# Patient Record
Sex: Male | Born: 1960 | Race: Black or African American | Hispanic: No | Smoking: Never smoker
Health system: Southern US, Community
[De-identification: ages and names within clinical notes are randomized; demographics above are authoritative.]

## PROBLEM LIST (undated history)

## (undated) DIAGNOSIS — I639 Cerebral infarction, unspecified: Secondary | ICD-10-CM

## (undated) DIAGNOSIS — G4733 Obstructive sleep apnea (adult) (pediatric): Secondary | ICD-10-CM

## (undated) DIAGNOSIS — E119 Type 2 diabetes mellitus without complications: Secondary | ICD-10-CM

## (undated) DIAGNOSIS — M109 Gout, unspecified: Secondary | ICD-10-CM

## (undated) DIAGNOSIS — I10 Essential (primary) hypertension: Secondary | ICD-10-CM

## (undated) DIAGNOSIS — E785 Hyperlipidemia, unspecified: Secondary | ICD-10-CM

## (undated) DIAGNOSIS — I2510 Atherosclerotic heart disease of native coronary artery without angina pectoris: Secondary | ICD-10-CM

## (undated) DIAGNOSIS — C4492 Squamous cell carcinoma of skin, unspecified: Secondary | ICD-10-CM

## (undated) DIAGNOSIS — I422 Other hypertrophic cardiomyopathy: Secondary | ICD-10-CM

## (undated) HISTORY — DX: Squamous cell carcinoma of skin, unspecified: C44.92

---

## 2004-01-24 ENCOUNTER — Emergency Department: Payer: Self-pay | Admitting: General Practice

## 2005-03-06 ENCOUNTER — Emergency Department: Payer: Self-pay | Admitting: Unknown Physician Specialty

## 2006-10-24 ENCOUNTER — Emergency Department: Payer: Self-pay | Admitting: Emergency Medicine

## 2007-12-28 ENCOUNTER — Ambulatory Visit: Payer: Self-pay | Admitting: Cardiology

## 2007-12-28 ENCOUNTER — Inpatient Hospital Stay: Payer: Self-pay | Admitting: Internal Medicine

## 2008-09-06 ENCOUNTER — Emergency Department: Payer: Self-pay | Admitting: Emergency Medicine

## 2008-09-20 ENCOUNTER — Emergency Department: Payer: Self-pay | Admitting: Internal Medicine

## 2008-10-14 ENCOUNTER — Emergency Department: Payer: Self-pay | Admitting: Emergency Medicine

## 2009-05-21 ENCOUNTER — Emergency Department: Payer: Self-pay | Admitting: Internal Medicine

## 2009-08-07 ENCOUNTER — Emergency Department: Payer: Self-pay | Admitting: Emergency Medicine

## 2010-03-26 ENCOUNTER — Emergency Department: Payer: Self-pay | Admitting: Emergency Medicine

## 2010-03-30 ENCOUNTER — Emergency Department: Payer: Self-pay | Admitting: Emergency Medicine

## 2010-07-16 ENCOUNTER — Emergency Department: Payer: Self-pay | Admitting: Internal Medicine

## 2011-05-23 ENCOUNTER — Inpatient Hospital Stay: Payer: Self-pay | Admitting: Specialist

## 2011-05-23 LAB — COMPREHENSIVE METABOLIC PANEL
Albumin: 4 g/dL (ref 3.4–5.0)
Alkaline Phosphatase: 68 U/L (ref 50–136)
Anion Gap: 9 (ref 7–16)
BUN: 17 mg/dL (ref 7–18)
Bilirubin,Total: 0.8 mg/dL (ref 0.2–1.0)
Calcium, Total: 8.4 mg/dL — ABNORMAL LOW (ref 8.5–10.1)
Co2: 25 mmol/L (ref 21–32)
EGFR (Non-African Amer.): >60
Glucose: 111 mg/dL — ABNORMAL HIGH (ref 65–99)
Osmolality: 283 (ref 275–301)
SGOT(AST): 33 U/L (ref 15–37)
SGPT (ALT): 32 U/L
Sodium: 141 mmol/L (ref 136–145)
Total Protein: 7.5 g/dL (ref 6.4–8.2)

## 2011-05-23 LAB — URINALYSIS, COMPLETE
Bacteria: NONE SEEN
Blood: NEGATIVE
Glucose,UR: NEGATIVE mg/dL (ref 0–75)
Ketone: NEGATIVE
Ph: 6 (ref 4.5–8.0)
Protein: NEGATIVE
RBC,UR: 1 /HPF (ref 0–5)
Specific Gravity: 1.025 (ref 1.003–1.030)
WBC UR: NONE SEEN /HPF (ref 0–5)

## 2011-05-23 LAB — CBC
HGB: 13.1 g/dL (ref 13.0–18.0)
MCV: 97 fL (ref 80–100)
Platelet: 182 10*3/uL (ref 150–440)
RBC: 4 10*6/uL — ABNORMAL LOW (ref 4.40–5.90)
WBC: 6.1 10*3/uL (ref 3.8–10.6)

## 2011-05-23 LAB — CK TOTAL AND CKMB (NOT AT ARMC): CK, Total: 375 U/L — ABNORMAL HIGH (ref 35–232)

## 2011-05-23 LAB — PROTIME-INR
INR: 1
Prothrombin Time: 13.4 secs (ref 11.5–14.7)

## 2011-05-24 LAB — LIPID PANEL
Ldl Cholesterol, Calc: 34 mg/dL (ref 0–100)
VLDL Cholesterol, Calc: 58 mg/dL — ABNORMAL HIGH (ref 5–40)

## 2011-05-24 LAB — CBC WITH DIFFERENTIAL/PLATELET
Basophil #: 0 10*3/uL (ref 0.0–0.1)
Eosinophil #: 0.2 10*3/uL (ref 0.0–0.7)
Eosinophil %: 4.5 %
HGB: 12.9 g/dL — ABNORMAL LOW (ref 13.0–18.0)
Lymphocyte #: 2.3 10*3/uL (ref 1.0–3.6)
MCH: 32.4 pg (ref 26.0–34.0)
MCHC: 33.4 g/dL (ref 32.0–36.0)
Monocyte #: 0.5 10*3/uL (ref 0.0–0.7)
Monocyte %: 9.6 %
Neutrophil %: 41.3 %
Platelet: 164 10*3/uL (ref 150–440)
RBC: 3.99 10*6/uL — ABNORMAL LOW (ref 4.40–5.90)

## 2011-05-24 LAB — BASIC METABOLIC PANEL
Anion Gap: 11 (ref 7–16)
BUN: 16 mg/dL (ref 7–18)
Chloride: 109 mmol/L — ABNORMAL HIGH (ref 98–107)
Creatinine: 1.01 mg/dL (ref 0.60–1.30)
EGFR (African American): >60
EGFR (Non-African Amer.): >60
Glucose: 115 mg/dL — ABNORMAL HIGH (ref 65–99)
Osmolality: 285 (ref 275–301)

## 2012-05-21 ENCOUNTER — Emergency Department: Payer: Self-pay | Admitting: Emergency Medicine

## 2012-05-21 LAB — URINALYSIS, COMPLETE
Bacteria: NONE SEEN
Bilirubin,UR: NEGATIVE
Ketone: NEGATIVE
Nitrite: NEGATIVE
Ph: 6 (ref 4.5–8.0)
Specific Gravity: 1.01 (ref 1.003–1.030)
Squamous Epithelial: 2
WBC UR: NONE SEEN /HPF (ref 0–5)

## 2012-05-21 LAB — COMPREHENSIVE METABOLIC PANEL
Albumin: 4.1 g/dL (ref 3.4–5.0)
BUN: 13 mg/dL (ref 7–18)
Bilirubin,Total: 0.9 mg/dL (ref 0.2–1.0)
Chloride: 106 mmol/L (ref 98–107)
Co2: 29 mmol/L (ref 21–32)
Creatinine: 1.13 mg/dL (ref 0.60–1.30)
EGFR (African American): >60
Osmolality: 278 (ref 275–301)
SGPT (ALT): 32 U/L (ref 12–78)
Total Protein: 8 g/dL (ref 6.4–8.2)

## 2012-05-21 LAB — CK TOTAL AND CKMB (NOT AT ARMC): CK, Total: 411 U/L — ABNORMAL HIGH (ref 35–232)

## 2012-05-21 LAB — CBC
HCT: 41.5 % (ref 40.0–52.0)
HGB: 14 g/dL (ref 13.0–18.0)
MCH: 32.9 pg (ref 26.0–34.0)
MCHC: 33.7 g/dL (ref 32.0–36.0)
MCV: 98 fL (ref 80–100)
RBC: 4.25 10*6/uL — ABNORMAL LOW (ref 4.40–5.90)
RDW: 13.5 % (ref 11.5–14.5)
WBC: 5.9 10*3/uL (ref 3.8–10.6)

## 2012-06-08 ENCOUNTER — Emergency Department: Payer: Self-pay | Admitting: Emergency Medicine

## 2012-10-12 ENCOUNTER — Emergency Department: Payer: Self-pay | Admitting: Emergency Medicine

## 2012-10-12 LAB — CBC
HCT: 41.6 % (ref 40.0–52.0)
HGB: 14.1 g/dL (ref 13.0–18.0)
MCHC: 33.8 g/dL (ref 32.0–36.0)
MCV: 98 fL (ref 80–100)
Platelet: 198 10*3/uL (ref 150–440)
RBC: 4.25 10*6/uL — ABNORMAL LOW (ref 4.40–5.90)
WBC: 6.1 10*3/uL (ref 3.8–10.6)

## 2012-10-12 LAB — COMPREHENSIVE METABOLIC PANEL
Albumin: 4.1 g/dL (ref 3.4–5.0)
Alkaline Phosphatase: 80 U/L (ref 50–136)
Anion Gap: 9 (ref 7–16)
BUN: 13 mg/dL (ref 7–18)
Calcium, Total: 9.1 mg/dL (ref 8.5–10.1)
Chloride: 104 mmol/L (ref 98–107)
Co2: 26 mmol/L (ref 21–32)
Creatinine: 1.1 mg/dL (ref 0.60–1.30)
EGFR (African American): >60
EGFR (Non-African Amer.): >60
Osmolality: 277 (ref 275–301)
Potassium: 3.9 mmol/L (ref 3.5–5.1)
SGPT (ALT): 29 U/L (ref 12–78)
Sodium: 139 mmol/L (ref 136–145)
Total Protein: 8 g/dL (ref 6.4–8.2)

## 2012-10-12 LAB — CK TOTAL AND CKMB (NOT AT ARMC)
CK, Total: 359 U/L — ABNORMAL HIGH (ref 35–232)
CK, Total: 285 U/L — ABNORMAL HIGH (ref 35–232)
CK-MB: 3.9 ng/mL — ABNORMAL HIGH (ref 0.5–3.6)

## 2012-10-12 LAB — TROPONIN I: Troponin-I: 0.02 ng/mL

## 2012-12-06 ENCOUNTER — Emergency Department: Payer: Self-pay | Admitting: Emergency Medicine

## 2013-01-15 ENCOUNTER — Emergency Department: Payer: Self-pay | Admitting: Emergency Medicine

## 2013-01-15 LAB — CBC
HCT: 39.2 % — ABNORMAL LOW (ref 40.0–52.0)
HGB: 13.1 g/dL (ref 13.0–18.0)
MCHC: 33.4 g/dL (ref 32.0–36.0)
MCV: 97 fL (ref 80–100)
RBC: 4.04 10*6/uL — ABNORMAL LOW (ref 4.40–5.90)
RDW: 13.5 % (ref 11.5–14.5)

## 2013-01-15 LAB — COMPREHENSIVE METABOLIC PANEL
Bilirubin,Total: 0.5 mg/dL (ref 0.2–1.0)
Calcium, Total: 8.8 mg/dL (ref 8.5–10.1)
Co2: 23 mmol/L (ref 21–32)
EGFR (African American): >60
EGFR (Non-African Amer.): >60
Osmolality: 278 (ref 275–301)
SGOT(AST): 19 U/L (ref 15–37)
SGPT (ALT): 28 U/L (ref 12–78)
Total Protein: 7.4 g/dL (ref 6.4–8.2)

## 2013-01-15 LAB — URINALYSIS, COMPLETE
Bacteria: NONE SEEN
Blood: NEGATIVE
Ketone: NEGATIVE
Nitrite: NEGATIVE
Protein: NEGATIVE
RBC,UR: NONE SEEN /HPF (ref 0–5)
Specific Gravity: 1.012 (ref 1.003–1.030)
Squamous Epithelial: 1
WBC UR: <1 /HPF (ref 0–5)

## 2013-09-22 DIAGNOSIS — I639 Cerebral infarction, unspecified: Secondary | ICD-10-CM

## 2013-09-22 HISTORY — DX: Cerebral infarction, unspecified: I63.9

## 2013-09-28 ENCOUNTER — Encounter (HOSPITAL_COMMUNITY): Payer: Self-pay | Admitting: *Deleted

## 2013-09-28 ENCOUNTER — Emergency Department: Payer: Self-pay | Admitting: Student

## 2013-09-28 ENCOUNTER — Inpatient Hospital Stay (HOSPITAL_COMMUNITY)
Admission: EM | Admit: 2013-09-28 | Discharge: 2013-09-30 | DRG: 556 | Disposition: A | Discharge status: Home / Self Care | Payer: Medicaid Other | Source: Other Acute Inpatient Hospital | Attending: Neurology | Admitting: Neurology

## 2013-09-28 ENCOUNTER — Inpatient Hospital Stay (HOSPITAL_COMMUNITY): Payer: Medicaid Other

## 2013-09-28 DIAGNOSIS — Z7982 Long term (current) use of aspirin: Secondary | ICD-10-CM

## 2013-09-28 DIAGNOSIS — I422 Other hypertrophic cardiomyopathy: Secondary | ICD-10-CM | POA: Diagnosis present

## 2013-09-28 DIAGNOSIS — E781 Pure hyperglyceridemia: Secondary | ICD-10-CM | POA: Diagnosis present

## 2013-09-28 DIAGNOSIS — I10 Essential (primary) hypertension: Secondary | ICD-10-CM | POA: Diagnosis present

## 2013-09-28 DIAGNOSIS — R209 Unspecified disturbances of skin sensation: Secondary | ICD-10-CM | POA: Diagnosis present

## 2013-09-28 DIAGNOSIS — Z9282 Status post administration of tPA (rtPA) in a different facility within the last 24 hours prior to admission to current facility: Secondary | ICD-10-CM | POA: Diagnosis not present

## 2013-09-28 DIAGNOSIS — R4789 Other speech disturbances: Secondary | ICD-10-CM | POA: Diagnosis present

## 2013-09-28 DIAGNOSIS — E785 Hyperlipidemia, unspecified: Secondary | ICD-10-CM

## 2013-09-28 DIAGNOSIS — R29898 Other symptoms and signs involving the musculoskeletal system: Principal | ICD-10-CM | POA: Diagnosis present

## 2013-09-28 DIAGNOSIS — I6330 Cerebral infarction due to thrombosis of unspecified cerebral artery: Secondary | ICD-10-CM

## 2013-09-28 DIAGNOSIS — I498 Other specified cardiac arrhythmias: Secondary | ICD-10-CM | POA: Diagnosis present

## 2013-09-28 DIAGNOSIS — R29810 Facial weakness: Secondary | ICD-10-CM | POA: Diagnosis present

## 2013-09-28 DIAGNOSIS — M6281 Muscle weakness (generalized): Secondary | ICD-10-CM

## 2013-09-28 DIAGNOSIS — R531 Weakness: Secondary | ICD-10-CM | POA: Diagnosis present

## 2013-09-28 DIAGNOSIS — Z6833 Body mass index (BMI) 33.0-33.9, adult: Secondary | ICD-10-CM | POA: Diagnosis not present

## 2013-09-28 DIAGNOSIS — E669 Obesity, unspecified: Secondary | ICD-10-CM | POA: Diagnosis present

## 2013-09-28 HISTORY — DX: Hyperlipidemia, unspecified: E78.5

## 2013-09-28 HISTORY — DX: Essential (primary) hypertension: I10

## 2013-09-28 HISTORY — DX: Gout, unspecified: M10.9

## 2013-09-28 LAB — COMPREHENSIVE METABOLIC PANEL
ALBUMIN: 3.6 g/dL (ref 3.5–5.2)
ALK PHOS: 56 U/L
ALT: 27 U/L
ALT: 16 U/L (ref 0–53)
AST: 23 U/L (ref 0–37)
Albumin: 3.7 g/dL (ref 3.4–5.0)
Alkaline Phosphatase: 51 U/L (ref 39–117)
Anion Gap: 9 (ref 7–16)
Anion gap: 12 (ref 5–15)
BUN: 21 mg/dL — ABNORMAL HIGH (ref 7–18)
BUN: 24 mg/dL — ABNORMAL HIGH (ref 6–23)
Bilirubin,Total: 0.8 mg/dL (ref 0.2–1.0)
CALCIUM: 8.7 mg/dL (ref 8.5–10.1)
CALCIUM: 9 mg/dL (ref 8.4–10.5)
CO2: 25 mmol/L (ref 21–32)
CO2: 21 mEq/L (ref 19–32)
Chloride: 108 mmol/L — ABNORMAL HIGH (ref 98–107)
Chloride: 104 mEq/L (ref 96–112)
Creatinine, Ser: 1.29 mg/dL (ref 0.50–1.35)
Creatinine: 1.82 mg/dL — ABNORMAL HIGH (ref 0.60–1.30)
EGFR (African American): 48 — ABNORMAL LOW
EGFR (Non-African Amer.): 41 — ABNORMAL LOW
GFR calc Af Amer: 72 mL/min — ABNORMAL LOW (ref >90)
GFR calc non Af Amer: 62 mL/min — ABNORMAL LOW (ref >90)
GLUCOSE: 103 mg/dL — AB (ref 65–99)
Glucose, Bld: 91 mg/dL (ref 70–99)
Osmolality: 286 (ref 275–301)
POTASSIUM: 4 mmol/L (ref 3.5–5.1)
Potassium: 4.2 mEq/L (ref 3.7–5.3)
SGOT(AST): 36 U/L (ref 15–37)
SODIUM: 137 meq/L (ref 137–147)
Sodium: 142 mmol/L (ref 136–145)
TOTAL PROTEIN: 7 g/dL (ref 6.0–8.3)
Total Bilirubin: 0.9 mg/dL (ref 0.3–1.2)
Total Protein: 7.8 g/dL (ref 6.4–8.2)

## 2013-09-28 LAB — PROTIME-INR
INR: 1
PROTHROMBIN TIME: 13.1 s (ref 11.5–14.7)

## 2013-09-28 LAB — CBC
HCT: 41.3 % (ref 40.0–52.0)
HCT: 38.8 % — ABNORMAL LOW (ref 39.0–52.0)
HGB: 13.6 g/dL (ref 13.0–18.0)
Hemoglobin: 13.1 g/dL (ref 13.0–17.0)
MCH: 33 pg (ref 26.0–34.0)
MCH: 32.8 pg (ref 26.0–34.0)
MCHC: 33.1 g/dL (ref 32.0–36.0)
MCHC: 33.8 g/dL (ref 30.0–36.0)
MCV: 100 fL (ref 80–100)
MCV: 97 fL (ref 78.0–100.0)
Platelet: 177 10*3/uL (ref 150–440)
Platelets: 180 10*3/uL (ref 150–400)
RBC: 4.14 10*6/uL — ABNORMAL LOW (ref 4.40–5.90)
RBC: 4 MIL/uL — AB (ref 4.22–5.81)
RDW: 13 % (ref 11.5–14.5)
RDW: 12.9 % (ref 11.5–15.5)
WBC: 4.9 10*3/uL (ref 3.8–10.6)
WBC: 5.2 10*3/uL (ref 4.0–10.5)

## 2013-09-28 LAB — MRSA PCR SCREENING: MRSA BY PCR: NEGATIVE

## 2013-09-28 LAB — TROPONIN I: Troponin-I: <0.02 ng/mL

## 2013-09-28 LAB — APTT: Activated PTT: 29.3 secs (ref 23.6–35.9)

## 2013-09-28 MED ORDER — SODIUM CHLORIDE 0.9 % IV SOLN
INTRAVENOUS | Status: DC
  Administered 2013-09-28 – 2013-09-29 (×3): via INTRAVENOUS

## 2013-09-28 MED ORDER — STROKE: EARLY STAGES OF RECOVERY BOOK
Freq: Once | Status: AC
  Administered 2013-09-28: 19:00:00
  Filled 2013-09-28: qty 1

## 2013-09-28 MED ORDER — ACETAMINOPHEN 650 MG RE SUPP: 650.0000 mg | RECTAL | Status: DC | PRN

## 2013-09-28 MED ORDER — METOCLOPRAMIDE HCL 5 MG/ML IJ SOLN
10.0000 mg | Freq: Once | INTRAMUSCULAR | Status: AC
  Administered 2013-09-28: 10 mg via INTRAVENOUS
  Filled 2013-09-28: qty 2

## 2013-09-28 MED ORDER — LABETALOL HCL 5 MG/ML IV SOLN: 10.0000 mg | INTRAVENOUS | Status: DC | PRN

## 2013-09-28 MED ORDER — PANTOPRAZOLE SODIUM 40 MG IV SOLR
40.0000 mg | Freq: Every day | INTRAVENOUS | Status: DC
  Administered 2013-09-28: 40 mg via INTRAVENOUS
  Filled 2013-09-28 (×2): qty 40

## 2013-09-28 MED ORDER — ACETAMINOPHEN 325 MG PO TABS
650.0000 mg | ORAL_TABLET | ORAL | Status: DC | PRN
  Administered 2013-09-29 – 2013-09-30 (×2): 650 mg via ORAL
  Filled 2013-09-28 (×2): qty 2

## 2013-09-28 NOTE — H&P (Addendum)
Neurology H&P  CC: Left-sided weakness  History is obtained from: Patient  HPI: Marc Bradford is a 53 y.o. male with a history of hypertension who presented to [facility] with complaint of left-sided weakness earlier today. He states that it started around 1 PM and he presented to [facility]. There they consulted with neurology who advised giving TPA and therefore he was started on TPA. We were called to accept the patient in transfer following TPA administration.  On arrival here, his NIH was 8 and therefore he was taken for emergent angiography to evaluate for large vessel occlusion. Due to his creatinine being 1.8, he was taken for MR angiography rather than CTA and I personally accompanied the patient. While in the MR suite, a diffusion image was obtained which did not show any signs of acute stroke.  LKW: 1 PM tpa given?: Yes, given an outside facility    ROS: A 14 point ROS was performed and is negative except as noted in the HPI.   PMH: Hypertension  Family History: No history of stroke  Social History: Tob: Denies  Exam: Current vital signs: BP 147/80  Pulse 61  Resp 12  Ht 5\' 6"  (1.676 m)  Wt 94.3 kg (207 lb 14.3 oz)  BMI 33.57 kg/m2  SpO2 96% Vital signs in last 24 hours: Pulse Rate:  [58-66] 61 (08/11 1815) Resp:  [12-22] 12 (08/11 1815) BP: (130-160)/(72-98) 147/80 mmHg (08/11 1815) SpO2:  [95 %-98 %] 96 % (08/11 1815) Weight:  [94.3 kg (207 lb 14.3 oz)] 94.3 kg (207 lb 14.3 oz) (08/11 1630)  General: In bed, NAD HEENT: He has some blood oozing from his gumline CV: Regular rate and rhythm Respiratory: Non-labored breathing Abdomen: Nontender, nondistended Extremities: No edema Mental Status: Patient is awake, alert, oriented to person, place, month, year, and situation. Immediate and remote memory are intact. Patient is able to give a clear and coherent history. No signs of aphasia or neglect He is dysarthric Cranial Nerves: II: He  intermittently will not identify number of fingers in left visual field. Pupils are equal, round, and reactive to light.  Discs are difficult to visualize. III,IV, VI: EOMI without ptosis or diploplia.  V: Facial sensation is symmetric to temperature VII: Facial movement is symmetric.  VIII: hearing is intact to voice X: Uvula elevates symmetrically XI: Shoulder shrug is symmetric. XII: tongue is midline without atrophy or fasciculations.  Motor: Tone is normal on the right. Bulk is normal. 5/5 strength was present on the right side. He resists movement when his left arm is passively moved. He also is inconsistent, initially being unable to lift his arm to participate in finger-nose-finger and subsequently being able to perform and after being started by the examiner. He also is inconsistent with his left leg sometimes being unable to raise it and sometimes raising. Sensory: Sensation is diminished throughout the left side of his body Deep Tendon Reflexes: 2+ and symmetric in the biceps and patellae.  Cerebellar: FNF intact bilaterally Gait: Not tested due to recent TPA administration   I have reviewed labs in epic and the results pertinent to this consultation are: Creatinine at [place] 1.8  I have reviewed the images obtained: MRA no large vessel occlusion  Impression: 53 year old male with left-sided weakness of unclear etiology. Possibilities include tiny brainstem infarction not seen on initial imaging, cervical spine disease (though this would not explain his full symptoms), complicated migraine, conversion disorder.  Recommendations: 1. HgbA1c, fasting lipid panel 2. MRI C-spine and  we'll repeat thin cut diffusions through the brainstem 3. Frequent neuro checks 4. no antiplatelets for 24 hours following TPA 5. Telemetry monitoring 6. PT consult, OT consult, Speech consult 7. EEG 8. Will give Reglan 10 mg x1 for possible competent migraine 9. elevated creatinine-will  hydrate  This patient is critically ill and at significant risk of neurological worsening, death and care requires constant monitoring of vital signs, hemodynamics,respiratory and cardiac monitoring, neurological assessment, discussion with family, other specialists and medical decision making of high complexity. I spent 60 minutes of neurocritical care time  in the care of  this patient.  Ritta SlotMcNeill Gitty Osterlund, MD [facility] [phone]  If 7pm- 7am, please page neurology on call as listed in AMION. 09/28/2013  6:48 PM

## 2013-09-29 ENCOUNTER — Encounter (HOSPITAL_COMMUNITY): Payer: Self-pay | Admitting: Radiology

## 2013-09-29 ENCOUNTER — Inpatient Hospital Stay (HOSPITAL_COMMUNITY): Payer: Medicaid Other

## 2013-09-29 DIAGNOSIS — I517 Cardiomegaly: Secondary | ICD-10-CM

## 2013-09-29 LAB — LIPID PANEL
CHOL/HDL RATIO: 5.1 ratio
Cholesterol: 180 mg/dL (ref 0–200)
HDL: 35 mg/dL — AB (ref >39)
LDL CALC: 69 mg/dL (ref 0–99)
Triglycerides: 382 mg/dL — ABNORMAL HIGH (ref <150)
VLDL: 76 mg/dL — AB (ref 0–40)

## 2013-09-29 LAB — URINALYSIS, ROUTINE W REFLEX MICROSCOPIC
Bilirubin Urine: NEGATIVE
GLUCOSE, UA: NEGATIVE mg/dL
Hgb urine dipstick: NEGATIVE
KETONES UR: NEGATIVE mg/dL
Leukocytes, UA: NEGATIVE
Nitrite: NEGATIVE
Protein, ur: NEGATIVE mg/dL
Specific Gravity, Urine: 1.013 (ref 1.005–1.030)
UROBILINOGEN UA: 0.2 mg/dL (ref 0.0–1.0)
pH: 5.5 (ref 5.0–8.0)

## 2013-09-29 LAB — HEMOGLOBIN A1C
Hgb A1c MFr Bld: 5.3 % (ref <5.7)
Mean Plasma Glucose: 105 mg/dL (ref <117)

## 2013-09-29 MED ORDER — ASPIRIN 325 MG PO TABS
325.0000 mg | ORAL_TABLET | Freq: Every day | ORAL | Status: DC
  Administered 2013-09-29 – 2013-09-30 (×2): 325 mg via ORAL
  Filled 2013-09-29 (×2): qty 1

## 2013-09-29 MED ORDER — LISINOPRIL 40 MG PO TABS
40.0000 mg | ORAL_TABLET | Freq: Every day | ORAL | Status: DC
  Administered 2013-09-29 – 2013-09-30 (×2): 40 mg via ORAL
  Filled 2013-09-29 (×2): qty 1

## 2013-09-29 MED ORDER — FENOFIBRATE 160 MG PO TABS
160.0000 mg | ORAL_TABLET | Freq: Every day | ORAL | Status: DC
  Administered 2013-09-29 – 2013-09-30 (×2): 160 mg via ORAL
  Filled 2013-09-29 (×2): qty 1

## 2013-09-29 MED ORDER — PANTOPRAZOLE SODIUM 40 MG PO TBEC
40.0000 mg | DELAYED_RELEASE_TABLET | Freq: Every day | ORAL | Status: DC
  Administered 2013-09-29 – 2013-09-30 (×2): 40 mg via ORAL
  Filled 2013-09-29 (×2): qty 1

## 2013-09-29 MED ORDER — SIMVASTATIN 20 MG PO TABS
20.0000 mg | ORAL_TABLET | Freq: Every day | ORAL | Status: DC
  Administered 2013-09-29: 20 mg via ORAL
  Filled 2013-09-29 (×2): qty 1

## 2013-09-29 NOTE — Progress Notes (Signed)
Stroke Team Progress Note  HISTORY Saiquan Hands is a 53 y.o. male with a history of hypertension who presented to [facility] with complaint of left-sided weakness earlier today. He states that it started around 1 PM  On 09/28/13 and he presented to [facility]. There they consulted with neurology over the phone who advised giving TPA and therefore he was started on TPA. We were called to accept the patient in transfer following TPA administration.  On arrival here, his NIH was 8  But he had variable effort and therefore he was taken for emergent angiography to evaluate for large vessel occlusion. Due to his creatinine being 1.8, he was taken for MR angiography rather than CTA .Marland Kitchen While in the MR suite, a diffusion image was obtained which did not show any signs of acute stroke.  LKW: 1 PM  tpa given?: Yes, given at [facility] . He was admitted to the neuro ICU for further evaluation and treatment.  SUBJECTIVE His wife is at the bedside.  Overall he feels his condition is gradually improving. He states his speech and facial weakness have improved but he still has mild numbness on the left side. MRI scan of the brain and MRA of the brain last night were both normal  OBJECTIVE Most recent Vital Signs: Filed Vitals:   09/29/13 0500 09/29/13 0600 09/29/13 0700 09/29/13 0741  BP: 119/76 128/74 133/71   Pulse: 53 52 50   Temp:    98.1 F (36.7 C)  TempSrc:    Oral  Resp: 15 18 14    Height:      Weight:      SpO2: 98% 97% 97%    CBG (last 3)  No results found for this basename: GLUCAP,  in the last 72 hours  IV Fluid Intake:   . sodium chloride 50 mL/hr at 09/28/13 2151    MEDICATIONS  . pantoprazole (PROTONIX) IV  40 mg Intravenous QHS   PRN:  acetaminophen, acetaminophen, labetalol  Diet:  Cardiac   Activity:  Bedrest    DVT Prophylaxis: none due to iv tpa CLINICALLY SIGNIFICANT STUDIES Basic Metabolic Panel:  Recent Labs Lab 09/28/13 2104  NA 137  K 4.2  CL 104  CO2 21   GLUCOSE 91  BUN 24*  CREATININE 1.29  CALCIUM 9.0   Liver Function Tests:  Recent Labs Lab 09/28/13 2104  AST 23  ALT 16  ALKPHOS 51  BILITOT 0.9  PROT 7.0  ALBUMIN 3.6   CBC:  Recent Labs Lab 09/28/13 2104  WBC 5.2  HGB 13.1  HCT 38.8*  MCV 97.0  PLT 180   Coagulation: No results found for this basename: LABPROT, INR,  in the last 168 hours Cardiac Enzymes: No results found for this basename: CKTOTAL, CKMB, CKMBINDEX, TROPONINI,  in the last 168 hours Urinalysis: No results found for this basename: COLORURINE, APPERANCEUR, LABSPEC, PHURINE, GLUCOSEU, HGBUR, BILIRUBINUR, KETONESUR, PROTEINUR, UROBILINOGEN, NITRITE, LEUKOCYTESUR,  in the last 168 hours Lipid Panel    Component Value Date/Time   CHOL 180 09/29/2013 0238   TRIG 382* 09/29/2013 0238   HDL 35* 09/29/2013 0238   CHOLHDL 5.1 09/29/2013 0238   VLDL 76* 09/29/2013 0238   LDLCALC 69 09/29/2013 0238   HgbA1C  No results found for this basename: HGBA1C    Urine Drug Screen:   No results found for this basename: labopia, cocainscrnur, labbenz, amphetmu, thcu, labbarb    Alcohol Level: No results found for this basename: ETH,  in the last 168  hours  Mr Shirlee LatchMra Head Wo Contrast  09/28/2013   CLINICAL DATA:  Left-sided weakness.  Code stroke status post tPA.  EXAM: MRI HEAD WITHOUT CONTRAST  MRA HEAD WITHOUT CONTRAST  TECHNIQUE: Multiplanar, multiecho pulse sequences of the brain and surrounding structures were obtained without intravenous contrast. Angiographic images of the head were obtained using MRA technique without contrast.  COMPARISON:  Head CT 09/28/2013 and MRI 05/24/2011.  FINDINGS: MRI HEAD FINDINGS  At the direction of the attending neurologist, only axial and coronal diffusion weighted images and axial susceptibility weighted images were obtained. There is no acute infarct or evidence of intracranial hemorrhage. There is no midline shift. Ventricles and sulci are normal for age.  MRA HEAD FINDINGS  Visualized  distal vertebral arteries are patent and codominant. PICA origins are patent. AICA and SCA origins are patent. Basilar artery is patent without stenosis. PCAs are unremarkable. There are small bilateral posterior communicating arteries. Internal carotid arteries are patent from skullbase to carotid termini. ACAs and MCAs are unremarkable.  IMPRESSION: 1. No acute infarct or intracranial hemorrhage. 2. Unremarkable head MRA.   Electronically Signed   By: Sebastian AcheAllen  Grady   On: 09/28/2013 19:42   Mr Brain Wo Contrast  09/28/2013   CLINICAL DATA:  Left-sided weakness.  Code stroke status post tPA.  EXAM: MRI HEAD WITHOUT CONTRAST  MRA HEAD WITHOUT CONTRAST  TECHNIQUE: Multiplanar, multiecho pulse sequences of the brain and surrounding structures were obtained without intravenous contrast. Angiographic images of the head were obtained using MRA technique without contrast.  COMPARISON:  Head CT 09/28/2013 and MRI 05/24/2011.  FINDINGS: MRI HEAD FINDINGS  At the direction of the attending neurologist, only axial and coronal diffusion weighted images and axial susceptibility weighted images were obtained. There is no acute infarct or evidence of intracranial hemorrhage. There is no midline shift. Ventricles and sulci are normal for age.  MRA HEAD FINDINGS  Visualized distal vertebral arteries are patent and codominant. PICA origins are patent. AICA and SCA origins are patent. Basilar artery is patent without stenosis. PCAs are unremarkable. There are small bilateral posterior communicating arteries. Internal carotid arteries are patent from skullbase to carotid termini. ACAs and MCAs are unremarkable.  IMPRESSION: 1. No acute infarct or intracranial hemorrhage. 2. Unremarkable head MRA.   Electronically Signed   By: Sebastian AcheAllen  Grady   On: 09/28/2013 19:42    MRI of the brain  normal  MRA of the brain  normal  Carotid Doppler  pending  2D Echocardiogram  pending CXR  ordered  EKG   pending.   Therapy  Recommendations pending  Physical Exam   pleasant middle-aged [place] male currently not in distress.Awake alert. Afebrile. Head is nontraumatic. Neck is supple without bruit. Hearing is normal. Cardiac exam no murmur or gallop. Lungs are clear to auscultation. Distal pulses are well felt. Neurological Exam : Awake alert oriented x 3 normal speech and language. No face asymmetry. Tongue midline.  . Subjective diminished left peripheral field of vision but variable effort next is questionable Mild diminished fine finger movements on left. Orbits right over left upper extremity. Mild left grip weak but poor and variable effort. Mild left lower extremity drift but effort is variable and distractible. Diminished left upper extremity and lower extremity touch and pinprick sensation . Normal coordination. NIH SS 3  ASSESSMENT Mr. Jasmine DecemberJohnny Mittelstadt is a 53 y.o. 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 Patient's exam has some variable and inconsistent features.  On aspirin 81 mg orally every day prior to admission. Now on no antiplatelets for secondary stroke prevention. Patient with resultant  mild left hemisensory loss and leg Ms.. Stroke work up underway.   Hypertriglyceridemia   LDL 69 on zocor PTA  HT  Obesity   Hospital day # 1  TREATMENT/PLAN Continue ICU level care with close neurological exam and blood pressure check as per post TPA protocol. Repeat MRI scan of the brain limited with diffusion imaging and coronal sections through the brain stem to look for small infarct missed on the initial MRI. Check echocardiogram, carotid Dopplers and hemoglobin A1c. Mobilize out of bed, physical and occupational therapy consults I had a long discussion with the patient and his wife regarding his neurological presentation,  results of evaluation so far, plan for further testing, treatment and answered questions. He remains at risk for recurrent strokes and TIAs and needs risk stratification workup and treatment to prevent future more disabling strokes This patient is critically ill and at significant risk of neurological worsening, death and care requires constant monitoring of vital signs, hemodynamics,respiratory and cardiac monitoring,review of multiple databases, neurological assessment, discussion with family, other specialists and medical decision making of high complexity.I have made any additions or clarifications directly to the above note.  I spent 35 minutes of neurocritical care time  in the care of  this patient.  SIGNED    To contact Stroke Continuity provider, please refer to [email or web address]. After hours, contact General Neurology

## 2013-09-29 NOTE — Progress Notes (Signed)
OT Cancellation Note  Patient Details Name: Marc Bradford MRN: [id number] DOB: 06/29/1960   Cancelled Treatment:    Reason Eval/Treat Not Completed: Patient not medically ready (bedrest)  Harolyn RutherfordJones, [name] Pager: [phone]  09/29/2013, 7:44 AM

## 2013-09-29 NOTE — Progress Notes (Signed)
  Echocardiogram 2D Echocardiogram has been performed.  Arvil ChacoFoster, Morgann Woodburn 09/29/2013, 4:34 PM

## 2013-09-29 NOTE — Evaluation (Signed)
Physical Therapy Evaluation Patient Details Name: Marc Bradford MRN: [id number] DOB: [birth year] Today's Date: 09/29/2013   History of Present Illness  Mr. Marc Bradford is a 53 y.o. male presenting with  sudden onset of left-sided weakness, numbness and slurred speech from suspected right subcortical/brainstem infarct treated with IV TPA at [facility] but initial MRI scan is negative for acute stroke.   Clinical Impression  Patient demonstrates inconsistent findings on physical mobility evaluation. At this time, patient may benefit from continued skilled PT to address mobility and safety concerns.  OF NOTE: Patient reporting deficits in strength LLE, inconsistent with strength assessment. Patient with + Hoover sign for LLE strength. Patient also reporting limited ROM, however patient was able to perform functional activity with full ROM and strength with no noted deficits. Had patient attempt marching in place during static standing, patient reports that he cannot bend his knee, this therapist reminded patient that he was able to bend his knee without difficulty during supine to sit transfer and sitting EOB, after which patient began to demonstrate increased ability to flex LLE during marching through full range of motion.  During ambulation patient with inconsistent gait deviations varying with normal functional gait.      Follow Up Recommendations No PT follow up    Equipment Recommendations  None recommended by PT    Recommendations for Other Services  (May benefit from psych consult for stress ([facility])) Patient did note that wife has recently been receiving chemo therapy treatments.    Precautions / Restrictions Precautions Precautions: Fall Restrictions Weight Bearing Restrictions: No      Mobility  Bed Mobility Overal bed mobility: Independent             General bed mobility comments: patient with no difficulty to come to EOB. No noted wekaness or difficulty with movement.  BLEs with good functionl mobility.  Transfers Overall transfer level: Modified independent (Supervision for inconsistancy) Equipment used: None             General transfer comment: Inconsistent with transfer. During initial standing patient reported weakness and pain, when questioned if LLE was painful or numb he reported numbness, then when asked again he reported LLE painful. had patient sit and then perform sit <> stand again and patient with no difficulty, full range and ability to flex and extend without physical assist of any kind.   Ambulation/Gait Ambulation/Gait assistance: Modified independent (Device/Increase time);Supervision Ambulation Distance (Feet): 180 Feet Assistive device: None Gait Pattern/deviations:  (inconsistent gait deviations, see comments) Gait velocity: modestly decreased Gait velocity interpretation: Below normal speed for age/gender General Gait Details: Patient with inconsistent gait deviations, at times oatient with poor flexion of LLE, when verbally cued patient states that he could not bend his knee, patient reminded that he was able to bend his knee during transfer activity and then patient was able to ambulate with functionally normal gait and no deviations.  Patient encouraged to continue ambulation with nsg staff.  Stairs            Wheelchair Mobility    Modified Rankin (Stroke Patients Only) Modified Rankin (Stroke Patients Only) Pre-Morbid Rankin Score: No symptoms Modified Rankin: Moderate disability     Balance                                             Pertinent Vitals/Pain Pain Assessment: 0-10  Home Living Family/patient expects to be discharged to:: Private residence Living Arrangements: Spouse/significant other Available Help at Discharge: Family Type of Home: House Home Access: Stairs to enter Entrance Stairs-Rails: None Entrance Stairs-Number of Steps: 2 Home Layout: One level Home  Equipment: Cane - single point      Prior Function Level of Independence: Independent               Hand Dominance   Dominant Hand: Right    Extremity/Trunk Assessment   Upper Extremity Assessment: Overall WFL for tasks assessed           Lower Extremity Assessment: Overall WFL for tasks assessed (+hoover sign), inconsistent strength testing, modifications and assessment through function activity demonstrates full ROM bilaterally and good overall symmetrical strength despite reported weakness. Patient was able to perform single leg stance bilaterally with no need for assist despite inability to perform minimal/partial range of motion when verbally directed for testing.         Communication      Cognition Arousal/Alertness: Awake/alert Behavior During Therapy: WFL for tasks assessed/performed Overall Cognitive Status: Within Functional Limits for tasks assessed                      General Comments      Exercises General Exercises - Lower Extremity Ankle Circles/Pumps: AROM;Both;5 reps Long Arc Quad: AROM;Both;5 reps Hip Flexion/Marching: AROM;Both;5 reps      Assessment/Plan    PT Assessment Patient needs continued PT services  PT Diagnosis     PT Problem List Decreased mobility;Decreased safety awareness  PT Treatment Interventions DME instruction;Gait training;Stair training;Functional mobility training;Therapeutic activities;Therapeutic exercise;Balance training;Patient/family education   PT Goals (Current goals can be found in the Care Plan section) Acute Rehab PT Goals Patient Stated Goal: to go home PT Goal Formulation: With patient Time For Goal Achievement: 10/13/13 Potential to Achieve Goals: Good    Frequency Min 4X/week   Barriers to discharge        Co-evaluation               End of Session Equipment Utilized During Treatment: Gait belt Activity Tolerance: Patient tolerated treatment well Patient left: in bed;Other  (comment) (EEG in room) Nurse Communication: Mobility status         Time: [id number] PT Time Calculation (min): 26 min   Charges:   PT Evaluation $Initial PT Evaluation Tier I: 1 Procedure PT Treatments $Gait Training: 8-22 mins $Therapeutic Activity: 8-22 mins   PT G CodesFabio Asa:          Tabitha Tupper J 09/29/2013, 3:03 PM Charlotte Crumbevon Myah Guynes, PT DPT  [phone]

## 2013-09-29 NOTE — Progress Notes (Signed)
SLP Cancellation Note  Patient Details Name: Marc Bradford MRN: [id number] DOB: 12/22/1960   Cancelled treatment:       Reason Eval/Treat Not Completed: Patient at procedure or test/unavailable.  Will follow-up as able.  Fae PippinMelissa Karin Griffith, M.A., CCC-SLP [phone]  [name] 09/29/2013, 4:13 PM

## 2013-09-29 NOTE — Procedures (Signed)
EEG report.  Brief clinical history:  53 year old male with left-sided weakness of unclear etiology.  Technique: this is a 17 channel routine scalp EEG performed at the bedside with bipolar and monopolar montages arranged in accordance to the international 10/20 system of electrode placement. One channel was dedicated to EKG recording.  No sleep was achieved. Hyperventilation was utilized as activating procedure.  Description:In the wakeful state, the best background consisted of a low amplitude, posterior dominant, well sustained, symmetric and reactive 12 Hz rhythm.  Hyperventilation did not induce physiologic slowing or epileptiform discharges. No focal or generalized epileptiform discharges noted.  No pathologic areas of slowing seen.  EKG showed sinus rhythm.  Impression: this is a normal awake EEG. Please, be aware that a normal EEG does not exclude the possibility of epilepsy.   Clinical correlation is advised.  Wyatt Portelasvaldo Cyprus Kuang, MD

## 2013-09-29 NOTE — Progress Notes (Signed)
EEG completed; results pending.    

## 2013-09-30 DIAGNOSIS — E785 Hyperlipidemia, unspecified: Secondary | ICD-10-CM

## 2013-09-30 DIAGNOSIS — I10 Essential (primary) hypertension: Secondary | ICD-10-CM

## 2013-09-30 DIAGNOSIS — I422 Other hypertrophic cardiomyopathy: Secondary | ICD-10-CM

## 2013-09-30 HISTORY — DX: Essential (primary) hypertension: I10

## 2013-09-30 HISTORY — DX: Other hypertrophic cardiomyopathy: I42.2

## 2013-09-30 MED ORDER — FENOFIBRATE 160 MG PO TABS: 160.0000 mg | ORAL_TABLET | Freq: Every day | ORAL | Status: DC

## 2013-09-30 NOTE — Discharge Summary (Signed)
Stroke Discharge Summary  Patient ID: Marc RiggsJohnny Bradford    l   MRN: [id number]      DOB: 05/10/1960  Date of Admission: 09/28/2013 Date of Discharge: 09/30/2013  Attending Physician:  Delia HeadyPramod Geralda Baumgardner, MD, Stroke MD  Consulting Physician(s):   Treatment Team:  M[facility] Stroke, MD [facility], MD   Patient's PCP:  No primary provider on file.  Discharge Diagnoses:    Non organic Left-sided weakness   Other hypertrophic cardiomyopathy   Essential hypertension   apical hypertrophic cardiomyopathy.   Hyperlipidemia   Bradycardia   Obesity,  BMI: Body mass index is 33.57 kg/(m^2).  Past Medical History  Diagnosis Date  . Hypertension   . Gout   . Hyperlipidemia    History reviewed. No pertinent past surgical history.    Medication List         aspirin EC 81 MG tablet  Take 81 mg by mouth daily.     fenofibrate 160 MG tablet  Take 1 tablet (160 mg total) by mouth daily.     quinapril 40 MG tablet  Commonly known as:  ACCUPRIL  Take 40 mg by mouth daily.     simvastatin 20 MG tablet  Commonly known as:  ZOCOR  Take 20 mg by mouth at bedtime.        LABORATORY STUDIES CBC    Component Value Date/Time   WBC 5.2 09/28/2013 2104   RBC 4.00* 09/28/2013 2104   HGB 13.1 09/28/2013 2104   HCT 38.8* 09/28/2013 2104   PLT 180 09/28/2013 2104   MCV 97.0 09/28/2013 2104   MCH 32.8 09/28/2013 2104   MCHC 33.8 09/28/2013 2104   RDW 12.9 09/28/2013 2104   CMP    Component Value Date/Time   NA 137 09/28/2013 2104   K 4.2 09/28/2013 2104   CL 104 09/28/2013 2104   CO2 21 09/28/2013 2104   GLUCOSE 91 09/28/2013 2104   BUN 24* 09/28/2013 2104   CREATININE 1.29 09/28/2013 2104   CALCIUM 9.0 09/28/2013 2104   PROT 7.0 09/28/2013 2104   ALBUMIN 3.6 09/28/2013 2104   AST 23 09/28/2013 2104   ALT 16 09/28/2013 2104   ALKPHOS 51 09/28/2013 2104   BILITOT 0.9 09/28/2013 2104   GFRNONAA 62* 09/28/2013 2104   GFRAA 72* 09/28/2013 2104   COAGS No results found for this basename: INR, PROTIME    Lipid Panel    Component Value Date/Time   CHOL 180 09/29/2013 0238   TRIG 382* 09/29/2013 0238   HDL 35* 09/29/2013 0238   CHOLHDL 5.1 09/29/2013 0238   VLDL 76* 09/29/2013 0238   LDLCALC 69 09/29/2013 0238   HgbA1C  Lab Results  Component Value Date   HGBA1C 5.3 09/29/2013   Cardiac Panel (last 3 results) No results found for this basename: CKTOTAL, CKMB, TROPONINI, RELINDX,  in the last 72 hours Urinalysis    Component Value Date/Time   COLORURINE YELLOW 09/29/2013 2035   APPEARANCEUR CLEAR 09/29/2013 2035   LABSPEC 1.013 09/29/2013 2035   PHURINE 5.5 09/29/2013 2035   GLUCOSEU NEGATIVE 09/29/2013 2035   HGBUR NEGATIVE 09/29/2013 2035   BILIRUBINUR NEGATIVE 09/29/2013 2035   KETONESUR NEGATIVE 09/29/2013 2035   PROTEINUR NEGATIVE 09/29/2013 2035   UROBILINOGEN 0.2 09/29/2013 2035   NITRITE NEGATIVE 09/29/2013 2035   LEUKOCYTESUR NEGATIVE 09/29/2013 2035   Urine Drug Screen  No results found for this basename: labopia, cocainscrnur, labbenz, amphetmu, thcu, labbarb    Alcohol Level No results found for this basename: eth  SIGNIFICANT DIAGNOSTIC STUDIES MRI of the brain normal  MRA of the brain normal  Carotid Doppler No evidence of hemodynamically significant internal carotid artery stenosis. Vertebral artery flow is antegrade.  2D Echocardiogram There is severe hypertrophy of the apical segments of the left ventricle (spade shape) consistent with apical form of hypertrophic cardiomyopathy.  CXR Mild cardiac enlargement, stable. No edema or consolidation. EKG Sinus bradycardia. ST & Marked T wave abnormality, consider anterolateral ischemia     History of Present Illness  Marc Bradford is a 53 y.o. male with a history of hypertension who presented to [facility] with complaint of left-sided weakness earlier today. He states that it started around 1 PM On 09/28/13 and he presented to [facility]. There they consulted with neurology over the phone who advised giving TPA  and therefore he was started on TPA. He was transferred to [facility] following TPA administration. On arrival, his NIHSS was 8 but he had variable effort and therefore he was taken for emergent angiography to evaluate for large vessel occlusion. Due to his creatinine being 1.8, he was taken for MR angiography rather than CTA. While in the MR suite, a diffusion image was obtained which did not show any signs of acute stroke. He was admitted to the neuro ICU for further evaluation and treatment.    Hospital Course Patient tolerated tPA without complication. Imaging at 24 hours shows no hemorrhage. Repeat MRI confirmed no acute infarct. Dx:  Left sided weakness and numbness not related to stroke/TIA; this was non-organic. Patient's exam has some variable and inconsistent features.   He was on  aspirin 81 mg orally every day prior to admission. He was resumed on aspirin 81 mg orally every day at time of discharge.   Patient with vascular risk factors of:  Hypertriglyceridemia  LDL 69 on zocor PTA  HT  Obesity  2D echo did show incidental severe hypertrophy of the apical segments of the left ventricle (spade shape) consistent with apical form of hypertrophic cardiomyopathy in the setting of bradycardia. Dr. Delton See consulted and recommended a cardiac MRI is recommended to confirm the diagnosis and risk stratify the patient. Ventricular tachycardias and SCD and ICD might be considered. Ideally she would like to use beta-blockers, however he is bradycardic. The MRI could not be done in a timely manner in the hospital, therefore, the patient was discharged. He is to follow up with her as an OP. Plans an exercise treadmill stress test to test arrhythmia inducibility on exertion.   Physical therapy, occupational therapy and speech therapy evaluated patient. No therapy f/u recommended.   Discharge Exam  Blood pressure 110/76, pulse 54, temperature 97.8 F (36.6 C), temperature source Oral, resp. rate 18,  height 5\' 6"  (1.676 m), weight 94.3 kg (207 lb 14.3 oz), SpO2 100.00%. pleasant middle-aged [place] American male currently not in distress.Awake alert. Afebrile. Head is nontraumatic. Neck is supple without bruit. Hearing is normal. Cardiac exam no murmur or gallop. Lungs are clear to auscultation. Distal pulses are well felt.  Neurological Exam : Awake alert oriented x 3 normal speech and language. No face asymmetry. Tongue midline. . Subjective diminished left peripheral field of vision but variable effort next is questionable Mild diminished fine finger movements on left. Orbits right over left upper extremity. Mild left grip weak but poor and variable effort. Mild left lower extremity drift but effort is variable and distractible. Diminished left upper extremity and lower extremity touch and pinprick sensation . Normal coordination.  NIH SS  3   Discharge Diet   Cardiac thin liquids  Discharge Plan    Disposition:  Home with wife   Continue aspirin 81 mg orally every day   Ongoing risk factor control by Primary Care Physician.  Follow-up primary provider in 2 weeks.  Follow up Dr. Roda Shutters in 2 months  Follow up DR. Nelson 10/19 at 845a  35 minutes were spent preparing discharge.  Annie Main, MSN, RN, ANVP-BC, ANP-BC, [facility] Pager: [phone] 10/01/2013 5:32 PM  I have personally examined this patient, reviewed notes, independently viewed imaging studies, participated in medical decision making and plan of care. I have made any additions or clarifications directly to the above note. Agree with note above.  Delia Heady, MD Medical Director [facility] Pager: [phone] 10/01/2013 5:43 PM

## 2013-09-30 NOTE — Evaluation (Signed)
Occupational Therapy Evaluation Patient Details Name: Marc Bradford MRN: [id number] DOB: Sep 02, 1960 Today's Date: 09/30/2013    History of Present Illness 53 y.o. male presenting with sudden onset of left-sided weakness, numbness and slurred speech given  IV TPA at [facility] but initial MRI scan is negative for acute stroke. NIH =8 on arrival   Clinical Impression   PT admitted for workup to R/o CVA s/p TPA with Lt side weakness and numbness. Pt currently with functional limitiations due to the deficits listed below (see OT problem list). PTA independent with all aspects of adls and Iadls. Pt will benefit from skilled OT to increase their independence and safety with adls and balance to allow discharge outpatient for LT Ue.Pt demonstrates decr strength and fine motor for LT UE compared to Rt. OT to follow acutely for LT UE HEP education.     Follow Up Recommendations  Outpatient OT    Equipment Recommendations  None recommended by OT    Recommendations for Other Services       Precautions / Restrictions Precautions Precautions: Fall      Mobility Bed Mobility Overal bed mobility: Independent             General bed mobility comments: HOB flat no rails  Transfers Overall transfer level: Needs assistance   Transfers: Sit to/from Stand Sit to Stand: Supervision         General transfer comment: pt ambulated to sink height. Pt transferred to chair then from chair to toilet. Pt transferred from toilet to chair. Pt using bil Ue on hand rest for support    Balance Overall balance assessment: Needs assistance         Standing balance support: During functional activity Standing balance-Leahy Scale: Fair                              ADL Overall ADL's : Needs assistance/impaired Eating/Feeding: Minimal assistance Eating/Feeding Details (indicate cue type and reason): (A) to open milk and lids Grooming: Wash/dry face;Oral care;Minimal  assistance;Standing Grooming Details (indicate cue type and reason): (A) to open box for tooth paste and tooth brush plastic Upper Body Bathing: Minimal assitance;Standing Upper Body Bathing Details (indicate cue type and reason): reports decr ability to reach and perform hygiene for Rt underarm due to LT UE deficits Lower Body Bathing: Minimal assistance;Sit to/from stand   Upper Body Dressing : Minimal assistance       Toilet Transfer: Min guard;Ambulation;Regular Social worker and Hygiene: Min guard;Sit to/from stand       Functional mobility during ADLs: Min guard General ADL Comments: Pt completed bed mobility independent level. Pt demonstrates Lt UE deficits with adls. Pt performed full adl this session alternating between sitting and standing. pt sitting to bath waist to toes.      Vision Eye Alignment: Within Functional Limits   Ocular Range of Motion: Within Functional Limits Tracking/Visual Pursuits: Able to track stimulus in all quads without difficulty   Convergence: Within functional limits         Perception Perception Perception Tested?: No   Praxis      Pertinent Vitals/Pain Pain Assessment: 0-10 Pain Score: 4  Pain Location: Rt arm  Pain Descriptors / Indicators: Numbness;Tingling Pain Intervention(s): Premedicated before session (describes as decr since medication)     Hand Dominance Right   Extremity/Trunk Assessment Upper Extremity Assessment Upper Extremity Assessment: LUE deficits/detail LUE Deficits / Details: ataxic movement with  reaching for objects, decr sensation hand to elbow. pt with decr grip strength 4 out 5  (reports numbness and tingling) LUE Sensation: decreased light touch LUE Coordination: decreased fine motor   Lower Extremity Assessment Lower Extremity Assessment: LLE deficits/detail LLE Sensation: decreased light touch (reports numbness)   Cervical / Trunk Assessment Cervical / Trunk  Assessment: Normal   Communication Communication Communication: No difficulties   Cognition Arousal/Alertness: Awake/alert Behavior During Therapy: WFL for tasks assessed/performed Overall Cognitive Status: Within Functional Limits for tasks assessed                     General Comments       Exercises       Shoulder Instructions      Home Living Family/patient expects to be discharged to:: Private residence Living Arrangements: Spouse/significant other Available Help at Discharge: Family Type of Home: House Home Access: Stairs to enter Secretary/administratorntrance Stairs-Number of Steps: 2 Entrance Stairs-Rails: None Home Layout: One level     Bathroom Shower/Tub: IT trainerTub/shower unit;Curtain   Bathroom Toilet: Standard     Home Equipment: Cane - single point          Prior Functioning/Environment Level of Independence: Independent             OT Diagnosis: Generalized weakness   OT Problem List: Decreased strength;Decreased activity tolerance;Impaired balance (sitting and/or standing);Decreased coordination;Impaired UE functional use   OT Treatment/Interventions: Self-care/ADL training;Therapeutic exercise;Neuromuscular education;Therapeutic activities;Balance training;Patient/family education    OT Goals(Current goals can be found in the care plan section) Acute Rehab OT Goals Patient Stated Goal: to go home OT Goal Formulation: With patient Time For Goal Achievement: 10/14/13 Potential to Achieve Goals: Good  OT Frequency: Min 2X/week   Barriers to D/C:            Co-evaluation              End of Session Equipment Utilized During Treatment: Gait belt Nurse Communication: Mobility status;Precautions  Activity Tolerance: Patient tolerated treatment well Patient left: in chair;with call bell/phone within reach   Time: 0810-0849 OT Time Calculation (min): 39 min Charges:  OT General Charges $OT Visit: 1 Procedure OT Evaluation $Initial OT Evaluation  Tier I: 1 Procedure OT Treatments $Self Care/Home Management : 23-37 mins G-Codes:    Boone MasterJones, Nhu Glasby B 09/30/2013, 9:01 AM Pager: [phone]

## 2013-09-30 NOTE — Consult Note (Signed)
CARDIOLOGY CONSULT NOTE   Patient ID: Marc Bradford MRN: [id number], DOB/AGE: 53/06/1960   Admit date: 09/28/2013 Date of Consult: 09/30/2013  Primary Physician: No primary provider on file. Primary Cardiologist: None  Reason for consult:  Abnormal ECG, echocardiogram  Problem List  Past Medical History  Diagnosis Date  . Hypertension   . Gout   . Hyperlipidemia     History reviewed. No pertinent past surgical history.   Allergies  No Known Allergies  HPI   Marc DecemberJohnny Bradford is a 53 y.o. male with a history of hypertension and hyperlipidemia treated with atorvastatin 10 mg daily at home who presented to [facility] with complaint of left-sided weakness yesterday. He received tPA, MRI/MRA showed no acute infarct and his symptoms have resolved.   The patient  Had an echocardiogram performed that showed normal biventricular size and function, mild basal left ventricular hypertrophy and severe apical hypertrophy with wall thickness of 19 mm. This is highly suspicious of hypertrophic cardiomyopathy. 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 He feels SOB at moderate exertion but not with the activities of daily living.  He has never smoked. He denies chest pain, palpitations or any previous syncope. His mother died suddenly in early 1830' of an unknown cause. The autopsy was not performed. One brother died of drug overdose.  Inpatient Medications  . aspirin  325 mg Oral Daily  . fenofibrate  160 mg Oral Daily  . lisinopril  40 mg Oral Daily  . pantoprazole  40 mg Oral Q1200  . simvastatin  20 mg Oral q1800    Family History History reviewed. No pertinent family history.   Social History History   Social History  . Marital Status: Married    Spouse Name: N/A    Number of Children: N/A  . Years of Education: N/A   Occupational History  . Not on file.   Social History Main Topics  . Smoking status: Never Smoker   . Smokeless tobacco: Not on  file  . Alcohol Use: Not on file  . Drug Use: Not on file  . Sexual Activity: Not on file   Other Topics Concern  . Not on file   Social History Narrative  . No narrative on file     Review of Systems  General:  No chills, fever, night sweats or weight changes.  Cardiovascular:  No chest pain, dyspnea on exertion, edema, orthopnea, palpitations, paroxysmal nocturnal dyspnea. Dermatological: No rash, lesions/masses Respiratory: No cough, dyspnea Urologic: No hematuria, dysuria Abdominal:   No nausea, vomiting, diarrhea, bright red blood per rectum, melena, or hematemesis Neurologic:  No visual changes, wkns, changes in mental status. All other systems reviewed and are otherwise negative except as noted above.  Physical Exam  Blood pressure 155/89, pulse 56, temperature 98.1 F (36.7 C), temperature source Oral, resp. rate 18, height 5\' 6"  (1.676 m), weight 207 lb 14.3 oz (94.3 kg), SpO2 100.00%.  General: Pleasant, NAD Psych: Normal affect. Neuro: Alert and oriented X 3. Moves all extremities spontaneously. HEENT: Normal  Neck: Supple without bruits or JVD. Lungs:  Resp regular and unlabored, CTA. Heart: RRR no s3, s4, or murmurs. Abdomen: Soft, non-tender, non-distended, BS + x 4.  Extremities: No clubbing, cyanosis or edema. DP/PT/Radials 2+ and equal bilaterally.  Labs  No results found for this basename: CKTOTAL, CKMB, TROPONINI,  in the last 72 hours Lab Results  Component Value Date   WBC 5.2 09/28/2013   HGB 13.1 09/28/2013  HCT 38.8* 09/28/2013   MCV 97.0 09/28/2013   PLT 180 09/28/2013    Recent Labs Lab 09/28/13 2104  NA 137  K 4.2  CL 104  CO2 21  BUN 24*  CREATININE 1.29  CALCIUM 9.0  PROT 7.0  BILITOT 0.9  ALKPHOS 51  ALT 16  AST 23  GLUCOSE 91   Lab Results  Component Value Date   CHOL 180 09/29/2013   HDL 35* 09/29/2013   LDLCALC 69 09/29/2013   TRIG 382* 09/29/2013   Radiology/Studies  Mr Brain Wo Contrast  09/28/2013   CLINICAL DATA:   Left-sided weakness.  Code stroke status post tPA. Marland Kitchen  IMPRESSION: 1. No acute infarct or intracranial hemorrhage. 2. Unremarkable head MRA.      Dg Chest Port 1 View  09/29/2013   CLINICAL DATA:  Hypertension  EXAM: PORTABLE CHEST - 1 VIEW  COMPARISON:  September 28, 2013  FINDINGS: There is no edema or consolidation. Heart is mildly enlarged with pulmonary vascularity within normal limits. No adenopathy. No bone lesions.  IMPRESSION: Mild cardiac enlargement, stable.  No edema or consolidation.   Electronically Signed   By: Bretta Bang M.D.   On: 09/29/2013 14:40   Mr Brain Ltd W/o Cm  09/29/2013   CLINICAL DATA:  LEFT-sided weakness. Code stroke yesterday. Status post tPA.   IMPRESSION: Negative for acute ischemia.      Echocardiogram - 09/29/2013 Left ventricle: There is mild concentric hypertrophy of the basal left ventricular segments and severe hypertrophy in the left ventricular apical segments with wall thickness 19 mm. The cavity size was normal. There was mild concentric hypertrophy. Systolic function was normal. The estimated ejection fraction was in the range of 55% to 60%. Wall motion was normal; there were no regional wall motion abnormalities. Features are consistent with a pseudonormal left ventricular filling pattern, with concomitant abnormal relaxation and increased filling pressure (grade 2 diastolic dysfunction). Doppler parameters are consistent with elevated ventricular end-diastolic filling pressure. - Aortic valve: Trileaflet; normal thickness leaflets. There was no regurgitation. - Aortic root: The aortic root was normal in size. - Left atrium: The atrium was normal in size. - Right atrium: The atrium was normal in size. - Pericardium, extracardiac: There was no pericardial effusion.  ECG: Sinus bradycardia, deep inverted T waves in the anterolateral leads    ASSESSMENT AND PLAN  53 year old male with h/o HTN and hyperlipidemia admitted for TIA with  resolved symptoms.  He was found to have an apical for of hypertrophic cardiomyopathy. He has family h/o of SCD in his mother in early 21'. ECG is typical for apical form of HCM as well.   We will order a cardiac MRI to confirm the diagnosis and to risk stratify - late gadolinium enhancement warrants worse prognosis, ventricular tachycardias and SCD and ICD might be considered.   Ideally we would like to use beta-blockers, however he is bradycardic.   We will schedule him an outpatient appointment in our clinic. Once he is recovered form TIA we will order an exercise treadmill stress test to test arrhythmia inducibility on exertion.  Signed, Lars Masson, MD, [facility] 09/30/2013, 11:11 AM

## 2013-09-30 NOTE — Clinical Documentation Improvement (Signed)
Noted in progress notes  CC: Left-sided weakness Please further specify if possible clinical conditions?  ____Hemiparesis ____Hemiplegia                            Dominant,     Right   Or Left sided  ____Other Condition__________________ ____Cannot Clinically Determine   Supporting Information: Risk Factors:Hypertriglyceridemia  Signs & Symptoms:"He resists movement when his left arm is passively moved. He also is inconsistent, initially being unable to lift his arm to participate in finger-nose-finger // inconsistent with his left leg sometimes being unable to raise it and sometimes raising." "facial weakness have improved but he still has mild numbness on the left side. // with resultant mild left hemisensory loss and leg   [facility]                   PT Evaluation                   OT Evaluation                   Frequent neuro checks, ICU level care                    Repeat MRI scan of the brain                     Fall precautions  Thank You, Marc GaussGarnet C Lovenia Bradford ,RN Clinical Documentation Specialist:  [phone]  [facility]- Health Information Management

## 2013-09-30 NOTE — Progress Notes (Signed)
Physical Therapy Treatment Patient Details Name: Marc Bradford MRN: [id number] DOB: 07/03/1960 Today's Date: 09/30/2013    History of Present Illness Mr. Marc DecemberJohnny Tayler is a 53 y.o. male presenting with  sudden onset of left-sided weakness, numbness and slurred speech from suspected right subcortical/brainstem infarct treated with IV TPA at [facility] but initial MRI scan is negative for acute stroke.     PT Comments    Patient demonstrates steady ambulation and demonstrates ability to navigate stairs (full flight) without any difficulty today. Wife present during activity and ambulation in room.  Follow Up Recommendations  No PT follow up     Equipment Recommendations  None recommended by PT    Recommendations for Other Services       Precautions / Restrictions      Mobility  Bed Mobility                  Transfers Overall transfer level: Modified independent Equipment used: None Transfers: Sit to/from Stand              Ambulation/Gait Ambulation/Gait assistance: Independent Ambulation Distance (Feet): 480 Feet Assistive device: None Gait Pattern/deviations: Step-through pattern;Decreased stride length;Narrow base of support   Gait velocity interpretation: Below normal speed for age/gender General Gait Details: a   Stairs Stairs: Yes Stairs assistance: Modified independent (Device/Increase time) Stair Management: One rail Right;Alternating pattern Number of Stairs: 12 General stair comments: initial cues for sequencing, however patient tolerated alternating steps without difficulty  Wheelchair Mobility    Modified Rankin (Stroke Patients Only)       Balance                                    Cognition Arousal/Alertness: Awake/alert Behavior During Therapy: WFL for tasks assessed/performed Overall Cognitive Status: Within Functional Limits for tasks assessed                      Exercises      General Comments         Pertinent Vitals/Pain      Home Living                      Prior Function            PT Goals (current goals can now be found in the care plan section) Acute Rehab PT Goals Patient Stated Goal: to go home PT Goal Formulation: With patient Time For Goal Achievement: 10/13/13 Potential to Achieve Goals: Good Progress towards PT goals: Progressing toward goals    Frequency  Min 4X/week    PT Plan Current plan remains appropriate    Co-evaluation             End of Session Equipment Utilized During Treatment: Gait belt Activity Tolerance: Patient tolerated treatment well Patient left: in chair;with call bell/phone within reach;with family/visitor present     Time: [id number] PT Time Calculation (min): 18 min  Charges:  $Gait Training: 8-22 mins                    G CodesFabio Asa:      Don Tiu J 09/30/2013, 12:12 PM Charlotte Crumbevon Tanaya Dunigan, PT DPT  [phone]

## 2013-09-30 NOTE — Progress Notes (Signed)
*  PRELIMINARY RESULTS* Vascular Ultrasound Carotid Duplex (Doppler) has been completed.  Preliminary findings: Bilateral:  1-39% ICA stenosis.  Vertebral artery flow is antegrade.     Farrel DemarkJill Eunice, RDMS, RVT  09/30/2013, 11:34 AM

## 2013-12-03 ENCOUNTER — Encounter: Payer: Self-pay | Admitting: *Deleted

## 2013-12-03 ENCOUNTER — Emergency Department: Payer: Self-pay | Admitting: Emergency Medicine

## 2013-12-03 LAB — CBC
HCT: 41.9 % (ref 40.0–52.0)
HGB: 14 g/dL (ref 13.0–18.0)
MCH: 33.1 pg (ref 26.0–34.0)
MCHC: 33.3 g/dL (ref 32.0–36.0)
MCV: 99 fL (ref 80–100)
Platelet: 194 10*3/uL (ref 150–440)
RBC: 4.22 10*6/uL — AB (ref 4.40–5.90)
RDW: 13.2 % (ref 11.5–14.5)
WBC: 5.6 10*3/uL (ref 3.8–10.6)

## 2013-12-03 LAB — COMPREHENSIVE METABOLIC PANEL
ALBUMIN: 4 g/dL (ref 3.4–5.0)
AST: 32 U/L (ref 15–37)
Alkaline Phosphatase: 64 U/L
Anion Gap: 8 (ref 7–16)
BILIRUBIN TOTAL: 0.7 mg/dL (ref 0.2–1.0)
BUN: 16 mg/dL (ref 7–18)
CREATININE: 1.12 mg/dL (ref 0.60–1.30)
Calcium, Total: 8.9 mg/dL (ref 8.5–10.1)
Chloride: 106 mmol/L (ref 98–107)
Co2: 26 mmol/L (ref 21–32)
EGFR (African American): >60
EGFR (Non-African Amer.): >60
GLUCOSE: 114 mg/dL — AB (ref 65–99)
Osmolality: 281 (ref 275–301)
POTASSIUM: 4.1 mmol/L (ref 3.5–5.1)
SGPT (ALT): 29 U/L
SODIUM: 140 mmol/L (ref 136–145)
Total Protein: 7.9 g/dL (ref 6.4–8.2)

## 2013-12-03 LAB — ETHANOL: Ethanol: <3 mg/dL (ref 0–80)

## 2013-12-03 LAB — ACETAMINOPHEN LEVEL

## 2013-12-03 LAB — SALICYLATE LEVEL: Salicylates, Serum: <1.7 mg/dL

## 2013-12-04 LAB — DRUG SCREEN, URINE
AMPHETAMINES, UR SCREEN: NEGATIVE (ref ?–1000)
BENZODIAZEPINE, UR SCRN: NEGATIVE (ref ?–200)
Barbiturates, Ur Screen: NEGATIVE (ref ?–200)
CANNABINOID 50 NG, UR ~~LOC~~: NEGATIVE (ref ?–50)
COCAINE METABOLITE, UR ~~LOC~~: NEGATIVE (ref ?–300)
MDMA (ECSTASY) UR SCREEN: NEGATIVE (ref ?–500)
METHADONE, UR SCREEN: NEGATIVE (ref ?–300)
Opiate, Ur Screen: NEGATIVE (ref ?–300)
Phencyclidine (PCP) Ur S: NEGATIVE (ref ?–25)
TRICYCLIC, UR SCREEN: NEGATIVE (ref ?–1000)

## 2013-12-06 ENCOUNTER — Encounter: Payer: Self-pay | Admitting: Cardiology

## 2013-12-06 ENCOUNTER — Ambulatory Visit (INDEPENDENT_AMBULATORY_CARE_PROVIDER_SITE_OTHER): Payer: Self-pay | Admitting: Cardiology

## 2013-12-06 VITALS — BP 128/72 | HR 68 | Ht 66.0 in | Wt 238.0 lb

## 2013-12-06 DIAGNOSIS — I422 Other hypertrophic cardiomyopathy: Secondary | ICD-10-CM

## 2013-12-06 LAB — BASIC METABOLIC PANEL
BUN: 16 mg/dL (ref 6–23)
CO2: 27 mEq/L (ref 19–32)
Calcium: 9.1 mg/dL (ref 8.4–10.5)
Chloride: 104 mEq/L (ref 96–112)
Creatinine, Ser: 1.2 mg/dL (ref 0.4–1.5)
GFR: 79.7 mL/min (ref >60.00)
Glucose, Bld: 104 mg/dL — ABNORMAL HIGH (ref 70–99)
Potassium: 4.1 mEq/L (ref 3.5–5.1)
Sodium: 138 mEq/L (ref 135–145)

## 2013-12-06 NOTE — Patient Instructions (Addendum)
Your physician recommends that you continue on your current medications as directed. Please refer to the Current Medication list given to you today.  Lab Today: [facility]  Your physician has requested that you have a cardiac MRI. Cardiac MRI uses a computer to create images of your heart as its beating, producing both still and moving pictures of your heart and major blood vessels. For further information please visit [email or web address]. Please follow the instruction sheet given to you today for more information.  Your physician recommends that you schedule a follow-up appointment in: 2 months

## 2013-12-06 NOTE — Progress Notes (Signed)
Patient ID: Marc Bradford, male   DOB: 11/16/1960, 53 y.o.   MRN: [id number]    Patient Name: Marc Bradford Date of Encounter: 12/06/2013  Primary Care Provider:  No primary provider on file. Primary Cardiologist:  Lars MassonNELSON, Aiyden Lauderback H  Problem List   Past Medical History  Diagnosis Date  . Gout   . Hyperlipidemia   . Other hypertrophic cardiomyopathy 09/30/2013  . Left-sided weakness 09/28/2013  . Essential hypertension 09/30/2013   Past Surgical History  Procedure Laterality Date  . No past surgeries     Allergies  No Known Allergies  HPI  Marc DecemberJohnny Guiffre is a 53 y.o. male with a history of hypertension and hyperlipidemia treated with atorvastatin 10 mg daily at home who was [facility] with complaint of left-sided weakness yesterday. He received tPA, MRI/MRA showed no acute infarct and his symptoms have resolved.  The patient Had an echocardiogram performed that showed normal biventricular size and function, mild basal left ventricular hypertrophy and severe apical hypertrophy with wall thickness of 19 mm. This is highly suspicious of hypertrophic cardiomyopathy. 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 He feels SOB at moderate exertion but not with the activities of daily living.  He has never smoked. He denies chest pain, palpitations or any previous syncope. His mother died suddenly in early 3330' of an unknown cause. The autopsy was not performed. One brother died of drug overdose.  He is coming for a follow up visit after two months. He is still recovering from his stroke, walking with a cane. He denies any chest pain, but with limited physical activity has stable DOE. He has h/o of two syncopal episodes that were never explained.   Home Medications  Prior to Admission medications   Medication Sig Start Date End Date Taking? Authorizing Provider  aspirin EC 81 MG tablet Take 81 mg by mouth daily.   Yes Historical Provider, MD  fenofibrate 160  MG tablet Take 1 tablet (160 mg total) by mouth daily. 09/30/13  Yes Layne BentonSharon L Biby, NP  quinapril (ACCUPRIL) 40 MG tablet Take 40 mg by mouth daily.   Yes Historical Provider, MD  simvastatin (ZOCOR) 20 MG tablet Take 20 mg by mouth at bedtime.   Yes Historical Provider, MD    Family History  Family History  Problem Relation Age of Onset  . Anemia Neg Hx   . Arrhythmia Neg Hx   . Asthma Neg Hx   . Cancer Neg Hx   . Depression Neg Hx   . Diabetes Neg Hx   . Heart attack Mother   . Heart failure Neg Hx   . Hyperlipidemia Neg Hx   . Hypertension Neg Hx   . Kidney disease Neg Hx   . Thyroid disease Neg Hx   . Fainting Neg Hx   . Stroke Neg Hx   . Sudden death Neg Hx     Social History  History   Social History  . Marital Status: Married    Spouse Name: N/A    Number of Children: N/A  . Years of Education: N/A   Occupational History  . Not on file.   Social History Main Topics  . Smoking status: Never Smoker   . Smokeless tobacco: Never Used  . Alcohol Use: Not on file  . Drug Use: Not on file  . Sexual Activity: Not on file   Other Topics Concern  . Not on file   Social History Narrative  . No  narrative on file     Review of Systems, as per HPI, otherwise negative General:  No chills, fever, night sweats or weight changes.  Cardiovascular:  No chest pain, dyspnea on exertion, edema, orthopnea, palpitations, paroxysmal nocturnal dyspnea. Dermatological: No rash, lesions/masses Respiratory: No cough, dyspnea Urologic: No hematuria, dysuria Abdominal:   No nausea, vomiting, diarrhea, bright red blood per rectum, melena, or hematemesis Neurologic:  No visual changes, wkns, changes in mental status. All other systems reviewed and are otherwise negative except as noted above.  Physical Exam  Blood pressure 128/72, pulse 68, height 5\' 6"  (1.676 m), weight 238 lb (107.956 kg), SpO2 96.00%.  General: Pleasant, NAD Psych: Normal affect. Neuro: Alert and oriented X  3. Moves all extremities spontaneously. HEENT: Normal  Neck: Supple without bruits or JVD. Lungs:  Resp regular and unlabored, CTA. Heart: RRR no s3, s4, or murmurs. Abdomen: Soft, non-tender, non-distended, BS + x 4.  Extremities: No clubbing, cyanosis or edema. DP/PT/Radials 2+ and equal bilaterally.  Labs:  No results found for this basename: CKTOTAL, CKMB, TROPONINI,  in the last 72 hours Lab Results  Component Value Date   WBC 5.2 09/28/2013   HGB 13.1 09/28/2013   HCT 38.8* 09/28/2013   MCV 97.0 09/28/2013   PLT 180 09/28/2013    No results found for this basename: DDIMER   No components found with this basename: POCBNP,     Component Value Date/Time   NA 137 09/28/2013 2104   K 4.2 09/28/2013 2104   CL 104 09/28/2013 2104   CO2 21 09/28/2013 2104   GLUCOSE 91 09/28/2013 2104   BUN 24* 09/28/2013 2104   CREATININE 1.29 09/28/2013 2104   CALCIUM 9.0 09/28/2013 2104   PROT 7.0 09/28/2013 2104   ALBUMIN 3.6 09/28/2013 2104   AST 23 09/28/2013 2104   ALT 16 09/28/2013 2104   ALKPHOS 51 09/28/2013 2104   BILITOT 0.9 09/28/2013 2104   GFRNONAA 62* 09/28/2013 2104   GFRAA 72* 09/28/2013 2104   Lab Results  Component Value Date   CHOL 180 09/29/2013   HDL 35* 09/29/2013   LDLCALC 69 09/29/2013   TRIG 382* 09/29/2013    Accessory Clinical Findings  Echocardiogram - 09/29/2013  Left ventricle: There is mild concentric hypertrophy of the basal left ventricular segments and severe hypertrophy in the left ventricular apical segments with wall thickness 19 mm. The cavity size was normal. There was mild concentric hypertrophy. Systolic function was normal. The estimated ejection fraction was in the range of 55% to 60%. Wall motion was normal; there were no regional wall motion abnormalities. Features are consistent with a pseudonormal left ventricular filling pattern, with concomitant abnormal relaxation and increased filling pressure (grade 2 diastolic dysfunction). Doppler parameters are  consistent with elevated ventricular end-diastolic filling pressure. - Aortic valve: Trileaflet; normal thickness leaflets. There was no regurgitation. - Aortic root: The aortic root was normal in size. - Left atrium: The atrium was normal in size. - Right atrium: The atrium was normal in size. - Pericardium, extracardiac: There was no pericardial effusion.  ECG: Sinus bradycardia, deep inverted T waves in the anterolateral leads    Assessment & Plan  53 year old male with h/o HTN and hyperlipidemia admitted for TIA with resolved symptoms.   1. Apical HCM - based on echo, strong FH of SCD, he needs cardiac MRI for risk stratification and potential need for an ICD - late gadolinium enhancement warrants worse prognosis, ventricular tachycardias and SCD and ICD might be  considered. H/o 2 syncopal episodes.  Ideally we would like to use beta-blockers, however he is bradycardic.  Once he is recovered form TIA we will order an exercise treadmill stress test to test arrhythmia inducibility on exertion, he is not able to walk on a treadmill yet..  2. BP - controlled   3. Hyperlipidemia - isolated TAG elevation - we will start fish oil   Follow up in 2 months.   Lars Masson, MD, [facility] 12/06/2013, 9:29 AM

## 2013-12-07 ENCOUNTER — Encounter: Payer: Self-pay | Admitting: Cardiology

## 2013-12-08 ENCOUNTER — Telehealth: Payer: Self-pay | Admitting: Cardiology

## 2013-12-08 NOTE — Telephone Encounter (Signed)
LMTCB

## 2013-12-08 NOTE — Telephone Encounter (Signed)
New message ° ° ° ° °Want lab results °

## 2013-12-08 NOTE — Telephone Encounter (Signed)
F/u ° ° °Pt returning your call °

## 2013-12-08 NOTE — Telephone Encounter (Signed)
Pt notified of normal labs per Dr Nelson.  Pt verbalized understanding.  

## 2013-12-23 ENCOUNTER — Ambulatory Visit (HOSPITAL_COMMUNITY)
Admission: RE | Admit: 2013-12-23 | Discharge: 2013-12-23 | Disposition: A | Discharge status: Home / Self Care | Payer: Self-pay | Source: Ambulatory Visit | Attending: Cardiology | Admitting: Cardiology

## 2013-12-23 DIAGNOSIS — I422 Other hypertrophic cardiomyopathy: Secondary | ICD-10-CM

## 2013-12-23 DIAGNOSIS — I517 Cardiomegaly: Secondary | ICD-10-CM | POA: Insufficient documentation

## 2013-12-23 MED ORDER — GADOBENATE DIMEGLUMINE 529 MG/ML IV SOLN
38.0000 mL | Freq: Once | INTRAVENOUS | Status: AC | PRN
  Administered 2013-12-23: 38 mL via INTRAVENOUS

## 2014-01-17 ENCOUNTER — Encounter: Payer: Self-pay | Admitting: *Deleted

## 2014-01-19 ENCOUNTER — Ambulatory Visit (INDEPENDENT_AMBULATORY_CARE_PROVIDER_SITE_OTHER): Payer: Self-pay | Admitting: Internal Medicine

## 2014-01-19 ENCOUNTER — Encounter: Payer: Self-pay | Admitting: Internal Medicine

## 2014-01-19 VITALS — BP 132/78 | HR 58 | Ht 66.0 in | Wt 240.0 lb

## 2014-01-19 DIAGNOSIS — I422 Other hypertrophic cardiomyopathy: Secondary | ICD-10-CM

## 2014-01-19 NOTE — Patient Instructions (Signed)
Your physician recommends that you continue on your current medications as directed. Please refer to the Current Medication list given to you today.  Your physician has recommended that you wear a 48 hour holter monitor. Holter monitors are medical devices that record the heart's electrical activity. Doctors most often use these monitors to diagnose arrhythmias. Arrhythmias are problems with the speed or rhythm of the heartbeat. The monitor is a small, portable device. You can wear one while you do your normal daily activities. This is usually used to diagnose what is causing palpitations/syncope (passing out).  Your physician has requested that you have an exercise tolerance test. For further information please visit [email or web address]. Please also follow instruction sheet, as given.  You are scheduled for 02/03/14 at 3pm

## 2014-01-19 NOTE — Progress Notes (Signed)
ELECTROPHYSIOLOGY CONSULT NOTE  Patient ID: Marc Bradford, MRN: [id number], DOB/AGE: 04/11/1960 53 y.o. Admit date: (Not on file) Date of Consult: 01/20/2014  Primary Physician: Lars Masson, MD Primary Cardiologist: KN  Chief Complaint: HCM   HPI Marc Bradford is a 53 y.o. male  Seen in consultation for hypertrophic cardiomyopathy. He has a history of hypertension and dyslipidemia. He had a stroke in August with residual left-sided weakness. It was nonhemorrhagic. During the evaluation he was found to have significant hypertrophy with apical involvement. He subsequently underwent MRI which demonstrated 29 mm apical segments with significant gadolinium enhancement.   He has modest exercise intolerance Without a history of peripheral edema.  His a history of syncope that occurred on 2 occasions about 5 or 8 years ago. These occurred following arguments with his wife. The second one he anticipated because a recognizable prodrome. They both occurred following abrupt standing in the context of a heated discussion  Family history is notable for his mother dying in her late 83s and another brother dying in his late 30s suddenly all seated on a chair. There are numerous siblings; his ability to recount the family history however is poor  He has had a sleep study which was apparently negative    Past Medical History  Diagnosis Date  . Gout   . Hyperlipidemia   . Other hypertrophic cardiomyopathy 09/30/2013  . Left-sided weakness 09/28/2013  . Essential hypertension 09/30/2013      Surgical History:  Past Surgical History  Procedure Laterality Date  . No past surgeries       Home Meds: Prior to Admission medications   Medication Sig Start Date End Date Taking? Authorizing Provider  aspirin EC 81 MG tablet Take 81 mg by mouth daily.    Historical Provider, MD  fenofibrate 160 MG tablet Take 1 tablet (160 mg total) by mouth daily. 09/30/13   Layne Benton, NP  quinapril (ACCUPRIL)  40 MG tablet Take 40 mg by mouth daily.    Historical Provider, MD  simvastatin (ZOCOR) 20 MG tablet Take 20 mg by mouth at bedtime.    Historical Provider, MD    Allergies: No Known Allergies  History   Social History  . Marital Status: Married    Spouse Name: N/A    Number of Children: N/A  . Years of Education: N/A   Occupational History  . Not on file.   Social History Main Topics  . Smoking status: Never Smoker   . Smokeless tobacco: Never Used  . Alcohol Use: Not on file  . Drug Use: Not on file  . Sexual Activity: Not on file   Other Topics Concern  . Not on file   Social History Narrative     Family History  Problem Relation Age of Onset  . Anemia Neg Hx   . Arrhythmia Neg Hx   . Asthma Neg Hx   . Cancer Neg Hx   . Depression Neg Hx   . Diabetes Other     family history  . Heart failure Neg Hx   . Hyperlipidemia Neg Hx   . Kidney disease Neg Hx   . Thyroid disease Neg Hx   . Fainting Neg Hx   . Stroke Neg Hx   . Heart attack Mother   . Sudden death Mother   . Hypertension Mother   . Drug abuse Brother   . Sudden death Father      ROS:  Please see the history of  present illness.     All other systems reviewed and negative.    Physical Exam: Blood pressure 132/78, pulse 58, height 5\' 6"  (1.676 m), weight 240 lb (108.863 kg). General: Well developed, well nourished male in no acute distress. Head: Normocephalic, atraumatic, sclera non-icteric, no xanthomas, nares are without discharge. EENT: normal Lymph Nodes:  none Back: without scoliosis/kyphosis, no CVA tendersness Neck: Negative for carotid bruits. JVD not elevated. Lungs: Clear bilaterally to auscultation without wheezes, rales, or rhonchi. Breathing is unlabored. Heart: RRR with S1 S2. No  murmur , rubs, or gallops appreciated. Abdomen: Soft, non-tender, non-distended with normoactive bowel sounds. No hepatomegaly. No rebound/guarding. No obvious abdominal masses. Msk:  Strength and tone  appear normal for age. Extremities: No clubbing or cyanosis. No edema.  Distal pedal pulses are 2+ and equal bilaterally. Skin: Warm and Dry Neuro: Alert and oriented X 3. CN III-XII intact Grossly normal sensory and motor function . Psych:  Responds to questions appropriately with a normal affect.      Labs: Cardiac Enzymes No results for input(s): CKTOTAL, CKMB, TROPONINI in the last 72 hours. CBC Lab Results  Component Value Date   WBC 5.2 09/28/2013   HGB 13.1 09/28/2013   HCT 38.8* 09/28/2013   MCV 97.0 09/28/2013   PLT 180 09/28/2013   PROTIME: No results for input(s): LABPROT, INR in the last 72 hours. Chemistry No results for input(s): NA, K, CL, CO2, BUN, CREATININE, CALCIUM, PROT, BILITOT, ALKPHOS, ALT, AST, GLUCOSE in the last 168 hours.  Invalid input(s): LABALBU Lipids Lab Results  Component Value Date   CHOL 180 09/29/2013   HDL 35* 09/29/2013   LDLCALC 69 09/29/2013   TRIG 382* 09/29/2013   BNP No results found for: PROBNP Miscellaneous No results found for: DDIMER  Radiology/Studies:  Mr Card Morphology Wo/w Cm  12/23/2013   CLINICAL DATA:  53 year old male with suspicion for apical hypertrophic cardiomyopathy on echocardiogram.  EXAM: CARDIAC MRI  TECHNIQUE: The patient was scanned on a 1.5 Tesla GE magnet. A dedicated cardiac coil was used. Functional imaging was done using Fiesta sequences. 2,3, and 4 chamber views were done to assess for RWMA's. Modified Simpson's rule using a short axis stack was used to calculate an ejection fraction on a dedicated work Research officer, trade unionstation using Circle software. The patient received 38 cc of Multihance. After 10 minutes inversion recovery sequences were used to assess for infiltration and scar tissue.  CONTRAST:  38 cc  of Multihance  FINDINGS: 1. Mildly dilated left ventricle with normal systolic function (LVEF = 66%).  There is mild to moderate concentric hypertrophy of the basal and mid portion of the left ventricle (thickness  11-14 mm) with severe hypertrophy of the apical segments with thickness 29 mm. Myocardial mass is severely increased at 308 grams.  The LV measurements are as follows:  LVEDD: 57 mm  LVESD:  35 mm  LVEDV:  147 ml  LVESV:  50 ml  SV:  97 ml  CO:  5 L/minute  2. Right ventricle is normal size with mild right ventricular hypertrophy and normal systolic function (RVEF = 56%).  3.  Normal bi-atrial size.  No significant valvular abnormality.  4. There is severe diffuse late gadolinium enhancement of all the of the apical segments of the left ventricle.  5. Normal size of the aortic root, ascending aorta and main pulmonary artery.  IMPRESSION: 1. Mildly dilated left ventricle with normal systolic function (LVEF = 66%). There is mild to moderate concentric  hypertrophy of the basal and mid portion of the left ventricle (thickness 11-14 mm) with severe hypertrophy of the apical segments with thickness 29 mm. Myocardial mass is severely increased at 308 grams.  There is severe diffuse late gadolinium enhancement of all the of the apical segments of the left ventricle.  These findings are consistent with hypertrophic cardiomyopathy predominantly in the apical segments.  The presence of the significant late gadolinium enhancement places the patient at the high risk for ventricular tachycardias. An ICD implantation should be considered.  2. Right ventricle is normal size with mild right ventricular hypertrophy and normal systolic function (RVEF = 56%).  Tobias AlexanderKatarina Nelson   Electronically Signed   By: Tobias AlexanderKatarina  Nelson   On: 12/23/2013 17:23    EKG: Sinus rhythm at 51 Intervals 20/10/48 Profound T-wave inversions anterolateral and inferior leads Assessment and Plan:  Hypertrophic Cardiomyopathy-apical variants  HTN  Abnormal MRI  Stroke    there are number of issues. The first is risk stratification for hypertrophic cardiomyopathy   His wall thickness is striking admit this is a reason for pursuing ICD implantation.  Further risk factors would be the presence of nonsustained ventricular tachycardia which we will evaluate by Holter an exercise associated hypotension which will  Pursue by exercise testing.   It is likely that he will come to ICD implantation. At that time, it would be my recommendation that we implant a dual-chamber device as HCM is frequently associated with atrial fibrillation and this man  Has had a previous stroke which might well be related to silent atrial fibrillation.  We will need also to pursue  Family screening.  We spent more than 50% of our >60 min visit in face to face counseling regarding the above  Pursuit of trying to explain the nature of the disease the implications of the diagnosis and the possible risks   Sherryl MangesSteven Klein

## 2014-01-21 ENCOUNTER — Telehealth: Payer: Self-pay

## 2014-01-21 NOTE — Telephone Encounter (Signed)
Spoke with patient regarding monitor placement. Told the patient we are out of monitors at the moment & that Marc [facility] will be [facility] soon. He is to call our office back if he does not hear from someone soon.

## 2014-01-31 ENCOUNTER — Telehealth: Payer: Self-pay | Admitting: Cardiovascular Disease

## 2014-01-31 ENCOUNTER — Telehealth: Payer: Self-pay | Admitting: Internal Medicine

## 2014-01-31 NOTE — Telephone Encounter (Signed)
Pt calling saying that he lost "elaine" number. She was going to help pt get monitor at [facility]. Please call

## 2014-01-31 NOTE — Telephone Encounter (Signed)
New message ° ° ° ° ° °Returning a nurses call °

## 2014-01-31 NOTE — Telephone Encounter (Signed)
Patient is waiting on an event monitor from [place] office and [facility]. Advised to call the [place] office and check with them. Asked him to call us back if he doesn't get a date for the monitor.

## 2014-01-31 NOTE — Telephone Encounter (Signed)
Notified patient of [facility] phone number.

## 2014-02-03 ENCOUNTER — Ambulatory Visit (INDEPENDENT_AMBULATORY_CARE_PROVIDER_SITE_OTHER): Payer: Self-pay | Admitting: Internal Medicine

## 2014-02-03 DIAGNOSIS — I422 Other hypertrophic cardiomyopathy: Secondary | ICD-10-CM

## 2014-02-03 NOTE — Progress Notes (Signed)
Exercise Treadmill Test For Treadmill test to look atBP response to exercise for risk stratification in the context of HOCM Pre-Exercise Testing Evaluation Rhythm: normal sinus  Rate: 72     Test  Exercise Tolerance Test Ordering MD: Sherryl MangesSteven Klein, MD  Interpreting MD: Sherryl MangesSteven Klein, MD  Unique Test No: 1  Treadmill:  1  Indication for ETT: HCM   Contraindication to ETT: No   Stress Modality: exercise - treadmill  Cardiac Imaging Performed: non   Protocol: standard Bruce - maximal  Max BP: 162/92    Pt unable to accomplish GXT

## 2014-02-04 ENCOUNTER — Ambulatory Visit: Payer: Self-pay | Admitting: Cardiology

## 2014-02-09 ENCOUNTER — Telehealth: Payer: Self-pay | Admitting: Internal Medicine

## 2014-02-09 NOTE — Telephone Encounter (Signed)
Called [facility] back (after receiving monitor results) and explained that I cannot accept critical report from 5 days ago. Holter monitor worn from 12/16-12/18. Will review results with Dr. Graciela HusbandsKlein.

## 2014-02-09 NOTE — Telephone Encounter (Signed)
[facility] called in a "critical" on patient from 48 hour holter. She reports 2 episodes of 2.20 sec pauses, but couldn't not give me date. She is faxing holter results for us to review.

## 2014-02-09 NOTE — Telephone Encounter (Signed)
New Message  Ruby [facility]) has critical Lab results to give. Please call back and discuss.

## 2014-02-14 ENCOUNTER — Telehealth: Payer: Self-pay | Admitting: Internal Medicine

## 2014-02-14 NOTE — Telephone Encounter (Signed)
New message       Pt insurance company would like for dr Graciela Husbandsklein to fill out a form to pay off a loan since he does not work anymore   please give pt a call

## 2014-02-21 NOTE — Telephone Encounter (Addendum)
F/u with Dr. Graciela Husbands on 03/24/14 - this ok per Dr. Graciela Husbands. Dr. Graciela Husbands reviewed monitor:  NSVT in setting of HCM.  Report faxed to [facility] office.

## 2014-02-21 NOTE — Telephone Encounter (Signed)
Informed patient he is welcome to drop paperwork off for Dr. Graciela Husbands to look at. But explained that if it needed to state he is unable to work - then we could not complete that for him, as Dr. Graciela Husbands has not stated he cannot work. Advised to drop off papers if he would like Dr. Graciela Husbands to see. Patient verbalized understanding.

## 2014-02-23 ENCOUNTER — Encounter: Payer: Self-pay | Admitting: Cardiology

## 2014-02-25 ENCOUNTER — Ambulatory Visit (INDEPENDENT_AMBULATORY_CARE_PROVIDER_SITE_OTHER): Payer: Self-pay

## 2014-02-25 ENCOUNTER — Other Ambulatory Visit: Payer: Self-pay

## 2014-02-25 DIAGNOSIS — I422 Other hypertrophic cardiomyopathy: Secondary | ICD-10-CM

## 2014-02-25 DIAGNOSIS — I4729 Other ventricular tachycardia: Secondary | ICD-10-CM

## 2014-02-25 DIAGNOSIS — I472 Ventricular tachycardia: Secondary | ICD-10-CM

## 2014-03-01 ENCOUNTER — Telehealth: Payer: Self-pay | Admitting: Cardiology

## 2014-03-01 NOTE — Telephone Encounter (Signed)
New message      Talk to the nurse to give an update from his PCP

## 2014-03-01 NOTE — Telephone Encounter (Signed)
Pt called stating that he received a letter for missing his appt on 02/04/14 with Dr. Delton SeeNelson.  He called to explain that he didn't come to this appt because he was here on 02/03/14 to see Dr. Graciela HusbandsKlein for a GXT and Dr. Graciela HusbandsKlein told him that he didn't need to come on the 18th, that he would see him back in 4-6 weeks.  Pt's next appt with Dr. Graciela HusbandsKlein is 03/24/14.

## 2014-03-24 ENCOUNTER — Encounter: Payer: Self-pay | Admitting: Internal Medicine

## 2014-03-24 ENCOUNTER — Ambulatory Visit (INDEPENDENT_AMBULATORY_CARE_PROVIDER_SITE_OTHER): Payer: Self-pay | Admitting: Internal Medicine

## 2014-03-24 VITALS — BP 142/82 | HR 65 | Ht 66.0 in | Wt 240.2 lb

## 2014-03-24 DIAGNOSIS — I4729 Other ventricular tachycardia: Secondary | ICD-10-CM

## 2014-03-24 DIAGNOSIS — I422 Other hypertrophic cardiomyopathy: Secondary | ICD-10-CM

## 2014-03-24 DIAGNOSIS — I472 Ventricular tachycardia: Secondary | ICD-10-CM

## 2014-03-24 NOTE — Patient Instructions (Signed)
Your physician recommends that you continue on your current medications as directed. Please refer to the Current Medication list given to you today.  Your physician has recommended that you have a defibrillator inserted. An implantable cardioverter defibrillator (ICD) is a small device that is placed in your chest or, in rare cases, your abdomen. This device uses electrical pulses or shocks to help control life-threatening, irregular heartbeats that could lead the heart to suddenly stop beating (sudden cardiac arrest). Leads are attached to the ICD that goes into your heart. This is done in the hospital and usually requires an overnight stay.   Carzell Saldivar, RN will call you to arrange this procedure

## 2014-03-24 NOTE — Progress Notes (Signed)
      Patient Care Team: Lars MassonKatarina H Nelson, MD as PCP - General (Cardiology)   HPI  Jasmine DecemberJohnny Bradford is a 54 y.o. male Seen in follow-up for hypertrophic cardiomyopathy. He has a history of hypertension and a prior stroke with some residual weakness. The stroke was nonhemorrhagic. He was found to havesignificant hypertrophy with apical involvement. He subsequently underwent MRI which demonstrated 29 mm apical segments with significant gadolinium enhancement.  He continues to have episodes of shortness of breath. He has some peripheral edema. He has significant sleep-disordered breathing and daytime somnolence.  He has had no tachycardia palpitations suggestive of atrial fibrillation.    Past Medical History  Diagnosis Date  . Gout   . Hyperlipidemia   . Other hypertrophic cardiomyopathy 09/30/2013  . Left-sided weakness 09/28/2013  . Essential hypertension 09/30/2013    Past Surgical History  Procedure Laterality Date  . No past surgeries      Current Outpatient Prescriptions  Medication Sig Dispense Refill  . aspirin EC 81 MG tablet Take 81 mg by mouth daily.    . fenofibrate 160 MG tablet Take 1 tablet (160 mg total) by mouth daily. 30 tablet 2  . quinapril (ACCUPRIL) 40 MG tablet Take 40 mg by mouth daily.    . simvastatin (ZOCOR) 20 MG tablet Take 20 mg by mouth at bedtime.     No current facility-administered medications for this visit.    No Known Allergies  Review of Systems negative except from HPI and PMH  Physical Exam BP 142/82 mmHg  Pulse 65  Ht 5\' 6"  (1.676 m)  Wt 240 lb 3.2 oz (108.954 kg)  BMI 38.79 kg/m2 Well developed and .Morbidly obese in no acute distress HENT normal E scleral and icterus clear Neck Supple JVP?  carotids brisk and full Clear to ausculation  Regular rate and rhythm, no murmurs gallops or rub Soft with active bowel sounds No clubbing cyanosis1+} Edema Alert and oriented, grossly normal motor and sensory function Skin Warm  and Dry  Holter monitor demonstrates nonsustained ventricular tachycardia nocturnal bradycardia and a mean heart rate during the day of about 70   Assessment and  Plan  Ventricular tachycardia-nonsustained  Hypertrophic cardiomyopathy- apical variant  HFpEF  Hypertension  Stroke  Sleep disordered breathing  The patient has hypertrophic cardiomyopathy.  Risk stratification includes Holter monitoring which has demonstrated nonsustained ventricular tachycardia. This identify significantly increased risk for sudden cardiac death;  ICD is recommended to    reduce the risk of sudden cardiac death.   He also has heart failure. We will add a diuretic. It would be advantageous to have beta blockers; dual-chamber device implantation will allow adjunctive use of these medications.  He has sleep disordered breathing and daytime somnolence. He needs a sleep study.  I'm also concerned as to whether he has atrial fibrillation as the cause of his stroke. It is highly concordant with sleep apnea and hypertrophic cardiomyopathy. Dual-chamber device and will allow us to monitor for atrial fibrillation. This would prompt us to change   To  anticoagulation from aspirin   Adjunctive beta blockers will help with blood pressure control  Have reviewed the potential benefits and risks of ICD implantation including but not limited to death, perforation of heart or lung, lead dislodgement, infection,  device malfunction and inappropriate shocks.  The patient and family express understanding  and are willing to proceed.

## 2014-04-01 ENCOUNTER — Telehealth: Payer: Self-pay | Admitting: *Deleted

## 2014-04-01 ENCOUNTER — Encounter: Payer: Self-pay | Admitting: *Deleted

## 2014-04-01 DIAGNOSIS — R400 Somnolence: Secondary | ICD-10-CM

## 2014-04-01 DIAGNOSIS — I422 Other hypertrophic cardiomyopathy: Secondary | ICD-10-CM

## 2014-04-01 DIAGNOSIS — Z01812 Encounter for preprocedural laboratory examination: Secondary | ICD-10-CM

## 2014-04-01 DIAGNOSIS — G4730 Sleep apnea, unspecified: Secondary | ICD-10-CM

## 2014-04-01 NOTE — Telephone Encounter (Signed)
Spoke with patient's wife, per his ok. Scheduled dual chamber ICD implant for 2/17. Pre procedure labs for 2/15. Wound check for 2/29. Reviewed letter of instructions with wife and left letter at front desk for them to pick up Monday when they come in for pre labs.  Also advised Dr. Graciela HusbandsKlein would like to have sleep study. They are aware someone will call them next week to arrange this study.

## 2014-04-04 ENCOUNTER — Other Ambulatory Visit (INDEPENDENT_AMBULATORY_CARE_PROVIDER_SITE_OTHER): Payer: Self-pay | Admitting: *Deleted

## 2014-04-04 DIAGNOSIS — Z01812 Encounter for preprocedural laboratory examination: Secondary | ICD-10-CM

## 2014-04-04 DIAGNOSIS — I422 Other hypertrophic cardiomyopathy: Secondary | ICD-10-CM

## 2014-04-04 LAB — BASIC METABOLIC PANEL
BUN: 12 mg/dL (ref 6–23)
CO2: 30 mEq/L (ref 19–32)
Calcium: 9.6 mg/dL (ref 8.4–10.5)
Chloride: 101 mEq/L (ref 96–112)
Creatinine, Ser: 1.22 mg/dL (ref 0.40–1.50)
GFR: 79.6 mL/min (ref >60.00)
GLUCOSE: 139 mg/dL — AB (ref 70–99)
Potassium: 4 mEq/L (ref 3.5–5.1)
Sodium: 137 mEq/L (ref 135–145)

## 2014-04-04 LAB — CBC WITH DIFFERENTIAL/PLATELET
Basophils Absolute: 0 10*3/uL (ref 0.0–0.1)
Basophils Relative: 0.6 % (ref 0.0–3.0)
EOS PCT: 3 % (ref 0.0–5.0)
Eosinophils Absolute: 0.2 10*3/uL (ref 0.0–0.7)
HEMATOCRIT: 42.1 % (ref 39.0–52.0)
Hemoglobin: 14.3 g/dL (ref 13.0–17.0)
Lymphocytes Relative: 44 % (ref 12.0–46.0)
Lymphs Abs: 2.3 10*3/uL (ref 0.7–4.0)
MCHC: 34 g/dL (ref 30.0–36.0)
MCV: 94.3 fl (ref 78.0–100.0)
MONOS PCT: 10.3 % (ref 3.0–12.0)
Monocytes Absolute: 0.5 10*3/uL (ref 0.1–1.0)
NEUTROS ABS: 2.2 10*3/uL (ref 1.4–7.7)
NEUTROS PCT: 42.1 % — AB (ref 43.0–77.0)
Platelets: 229 10*3/uL (ref 150.0–400.0)
RBC: 4.47 Mil/uL (ref 4.22–5.81)
RDW: 13.1 % (ref 11.5–15.5)
WBC: 5.3 10*3/uL (ref 4.0–10.5)

## 2014-04-05 DIAGNOSIS — Z7982 Long term (current) use of aspirin: Secondary | ICD-10-CM | POA: Diagnosis not present

## 2014-04-05 DIAGNOSIS — I422 Other hypertrophic cardiomyopathy: Secondary | ICD-10-CM | POA: Diagnosis present

## 2014-04-05 DIAGNOSIS — I10 Essential (primary) hypertension: Secondary | ICD-10-CM | POA: Diagnosis not present

## 2014-04-05 DIAGNOSIS — I509 Heart failure, unspecified: Secondary | ICD-10-CM | POA: Diagnosis not present

## 2014-04-05 DIAGNOSIS — M109 Gout, unspecified: Secondary | ICD-10-CM | POA: Diagnosis not present

## 2014-04-05 DIAGNOSIS — Z79899 Other long term (current) drug therapy: Secondary | ICD-10-CM | POA: Diagnosis not present

## 2014-04-05 DIAGNOSIS — G4730 Sleep apnea, unspecified: Secondary | ICD-10-CM | POA: Diagnosis not present

## 2014-04-05 DIAGNOSIS — Z8673 Personal history of transient ischemic attack (TIA), and cerebral infarction without residual deficits: Secondary | ICD-10-CM | POA: Diagnosis not present

## 2014-04-05 DIAGNOSIS — E785 Hyperlipidemia, unspecified: Secondary | ICD-10-CM | POA: Diagnosis not present

## 2014-04-05 MED ORDER — SODIUM CHLORIDE 0.9 % IR SOLN
80.0000 mg | Status: DC
  Filled 2014-04-05: qty 2

## 2014-04-05 MED ORDER — CEFAZOLIN SODIUM-DEXTROSE 2-3 GM-% IV SOLR: 2.0000 g | INTRAVENOUS | Status: DC

## 2014-04-05 MED ORDER — SODIUM CHLORIDE 0.9 % IV SOLN
INTRAVENOUS | Status: DC
Start: 2014-04-05 — End: 2014-04-06
  Administered 2014-04-06: 14:00:00 via INTRAVENOUS

## 2014-04-05 MED ORDER — SODIUM CHLORIDE 0.9 % IV SOLN
INTRAVENOUS | Status: DC
  Administered 2014-04-06: 14:00:00 via INTRAVENOUS

## 2014-04-06 ENCOUNTER — Ambulatory Visit (HOSPITAL_COMMUNITY)
Admission: RE | Admit: 2014-04-06 | Discharge: 2014-04-07 | Disposition: A | Discharge status: Home / Self Care | Payer: Medicaid Other | Source: Ambulatory Visit | Attending: Internal Medicine | Admitting: Internal Medicine

## 2014-04-06 ENCOUNTER — Encounter (HOSPITAL_COMMUNITY): Payer: Self-pay | Admitting: General Practice

## 2014-04-06 ENCOUNTER — Encounter (HOSPITAL_COMMUNITY)
Admission: RE | Disposition: A | Discharge status: Home / Self Care | Payer: Self-pay | Source: Ambulatory Visit | Attending: Internal Medicine

## 2014-04-06 DIAGNOSIS — I4729 Other ventricular tachycardia: Secondary | ICD-10-CM

## 2014-04-06 DIAGNOSIS — Z959 Presence of cardiac and vascular implant and graft, unspecified: Secondary | ICD-10-CM

## 2014-04-06 DIAGNOSIS — I509 Heart failure, unspecified: Secondary | ICD-10-CM | POA: Insufficient documentation

## 2014-04-06 DIAGNOSIS — I472 Ventricular tachycardia: Secondary | ICD-10-CM

## 2014-04-06 DIAGNOSIS — G4730 Sleep apnea, unspecified: Secondary | ICD-10-CM | POA: Insufficient documentation

## 2014-04-06 DIAGNOSIS — E785 Hyperlipidemia, unspecified: Secondary | ICD-10-CM | POA: Diagnosis not present

## 2014-04-06 DIAGNOSIS — I422 Other hypertrophic cardiomyopathy: Secondary | ICD-10-CM

## 2014-04-06 DIAGNOSIS — Z7982 Long term (current) use of aspirin: Secondary | ICD-10-CM | POA: Insufficient documentation

## 2014-04-06 DIAGNOSIS — I10 Essential (primary) hypertension: Secondary | ICD-10-CM | POA: Insufficient documentation

## 2014-04-06 DIAGNOSIS — Z8673 Personal history of transient ischemic attack (TIA), and cerebral infarction without residual deficits: Secondary | ICD-10-CM | POA: Insufficient documentation

## 2014-04-06 DIAGNOSIS — I5030 Unspecified diastolic (congestive) heart failure: Secondary | ICD-10-CM

## 2014-04-06 DIAGNOSIS — Z79899 Other long term (current) drug therapy: Secondary | ICD-10-CM | POA: Insufficient documentation

## 2014-04-06 DIAGNOSIS — M109 Gout, unspecified: Secondary | ICD-10-CM | POA: Insufficient documentation

## 2014-04-06 HISTORY — PX: IMPLANTABLE CARDIOVERTER DEFIBRILLATOR IMPLANT: SHX5473

## 2014-04-06 HISTORY — DX: Obstructive sleep apnea (adult) (pediatric): G47.33

## 2014-04-06 HISTORY — DX: Cerebral infarction, unspecified: I63.9

## 2014-04-06 HISTORY — DX: Other hypertrophic cardiomyopathy: I42.2

## 2014-04-06 LAB — SURGICAL PCR SCREEN
MRSA, PCR: NEGATIVE
Staphylococcus aureus: NEGATIVE

## 2014-04-06 SURGERY — IMPLANTABLE CARDIOVERTER DEFIBRILLATOR IMPLANT
Anesthesia: LOCAL

## 2014-04-06 MED ORDER — MIDAZOLAM HCL 5 MG/5ML IJ SOLN
INTRAMUSCULAR | Status: AC
  Filled 2014-04-06: qty 5

## 2014-04-06 MED ORDER — FENTANYL CITRATE 0.05 MG/ML IJ SOLN
INTRAMUSCULAR | Status: AC
  Filled 2014-04-06: qty 2

## 2014-04-06 MED ORDER — CHLORHEXIDINE GLUCONATE 4 % EX LIQD
60.0000 mL | Freq: Once | CUTANEOUS | Status: DC
  Filled 2014-04-06: qty 60

## 2014-04-06 MED ORDER — FENOFIBRATE 160 MG PO TABS
160.0000 mg | ORAL_TABLET | Freq: Every day | ORAL | Status: DC
  Administered 2014-04-07: 160 mg via ORAL
  Filled 2014-04-06: qty 1

## 2014-04-06 MED ORDER — HYDROCODONE-ACETAMINOPHEN 5-325 MG PO TABS
1.0000 | ORAL_TABLET | Freq: Four times a day (QID) | ORAL | Status: DC | PRN
  Administered 2014-04-07 (×2): 1 via ORAL
  Filled 2014-04-06 (×2): qty 1

## 2014-04-06 MED ORDER — FUROSEMIDE 10 MG/ML IJ SOLN
40.0000 mg | Freq: Once | INTRAMUSCULAR | Status: AC
  Administered 2014-04-06: 40 mg via INTRAVENOUS
  Filled 2014-04-06: qty 4

## 2014-04-06 MED ORDER — SODIUM CHLORIDE 0.9 % IV SOLN: INTRAVENOUS | Status: AC

## 2014-04-06 MED ORDER — TRAMADOL HCL 50 MG PO TABS
50.0000 mg | ORAL_TABLET | Freq: Two times a day (BID) | ORAL | Status: DC | PRN
  Administered 2014-04-06: 50 mg via ORAL
  Filled 2014-04-06: qty 1

## 2014-04-06 MED ORDER — ACETAMINOPHEN 325 MG PO TABS
325.0000 mg | ORAL_TABLET | ORAL | Status: DC | PRN
  Administered 2014-04-06: 650 mg via ORAL
  Filled 2014-04-06: qty 2

## 2014-04-06 MED ORDER — CEFAZOLIN SODIUM 1-5 GM-% IV SOLN
1.0000 g | Freq: Four times a day (QID) | INTRAVENOUS | Status: AC
  Administered 2014-04-06 – 2014-04-07 (×3): 1 g via INTRAVENOUS
  Filled 2014-04-06 (×3): qty 50

## 2014-04-06 MED ORDER — ASPIRIN EC 81 MG PO TBEC
81.0000 mg | DELAYED_RELEASE_TABLET | Freq: Every day | ORAL | Status: DC
  Administered 2014-04-07: 10:00:00 81 mg via ORAL
  Filled 2014-04-06: qty 1

## 2014-04-06 MED ORDER — MUPIROCIN 2 % EX OINT
TOPICAL_OINTMENT | CUTANEOUS | Status: AC
  Administered 2014-04-06: 1
  Filled 2014-04-06: qty 22

## 2014-04-06 MED ORDER — ASPIRIN EC 81 MG PO TBEC
81.0000 mg | DELAYED_RELEASE_TABLET | Freq: Every day | ORAL | Status: DC
Start: 2014-04-06 — End: 2014-04-06

## 2014-04-06 MED ORDER — ONDANSETRON HCL 4 MG/2ML IJ SOLN: 4.0000 mg | Freq: Four times a day (QID) | INTRAMUSCULAR | Status: DC | PRN

## 2014-04-06 MED ORDER — PROPRANOLOL HCL ER 80 MG PO CP24
80.0000 mg | ORAL_CAPSULE | Freq: Every day | ORAL | Status: DC
  Administered 2014-04-06 – 2014-04-07 (×2): 80 mg via ORAL
  Filled 2014-04-06 (×2): qty 1

## 2014-04-06 MED ORDER — LIDOCAINE HCL (PF) 1 % IJ SOLN
INTRAMUSCULAR | Status: AC
  Filled 2014-04-06: qty 30

## 2014-04-06 MED ORDER — SIMVASTATIN 20 MG PO TABS
20.0000 mg | ORAL_TABLET | Freq: Every day | ORAL | Status: DC
  Administered 2014-04-06: 20 mg via ORAL
  Filled 2014-04-06 (×2): qty 1

## 2014-04-06 MED ORDER — CEFAZOLIN SODIUM-DEXTROSE 2-3 GM-% IV SOLR
INTRAVENOUS | Status: AC
  Filled 2014-04-06: qty 50

## 2014-04-06 MED ORDER — HEPARIN (PORCINE) IN NACL 2-0.9 UNIT/ML-% IJ SOLN
INTRAMUSCULAR | Status: AC
  Filled 2014-04-06: qty 500

## 2014-04-06 MED ORDER — QUINAPRIL HCL 10 MG PO TABS
40.0000 mg | ORAL_TABLET | Freq: Every day | ORAL | Status: DC
  Administered 2014-04-07: 40 mg via ORAL
  Filled 2014-04-06: qty 4

## 2014-04-06 NOTE — CV Procedure (Signed)
Marc DecemberJohnny Bradford [id number]  [id number]  Preop Dx: hypertrophic cardiomyopathy with VT and Thickness 29 mm Postop Dx same/  NYHA Class 3  Cx: none apparent    Procedure: dual  chamber ICD implantation without intraoperative defibrillation threshold testing  Following the obtaining of informed consent the patient was brought to the electrophysiology laboratory in place of the fluoroscopic table in the supine position. After routine prep and drape, lidocaine was infiltrated in the prepectoral subclavicular region and an incision was made and carried down to the layer of the prepectoral fascia using electrocautery and sharp dissection. A pocket was formed similarly.  Thereafter  attention was turned to gaining access to the extrathoracic left subclavian vein which was accomplished with some  difficulty but without the aspiration of air or puncture of the artery.Two separate wires were placed through single venipuncture  Sequentially  A 9 French sheath  And 58F sheath were placed through which were   passed a Medtronic MRI compatible 6935 single coil   active fixation defibrillator lead, model 6935 serial number [id number] and a Medtronic MRI compatible 5076 active fixation atrial lead, serial number [id number] .  They  Were   passed under fluoroscopic guidance to the right ventricular apex and R atrial appendage  respectively.    In its location the bipolar R wave was 22 millivolts, impedance was 614 ohms, the pacing threshold was 0.8 volts at 0.5 msec. Current at threshold was 1.1 mA.  There was no diaphragmatic pacing at 10 V. The current of injury was brisk .  The bipolar P wave was 6.7 millivolts, impedance was 1180 ohms, the pacing threshold was 1.5 volts at 0.5 msec. Current at threshold was 1.4 mA.  There was no diaphragmatic pacing at 10 V. The current of injury was brisk .   The leads were secured to the prepectoral fascia and then attached to a Medtronic MRI compatible  ICD, serial number   [id number].  Through the device, the bipolar R wave was 20 millivolts, impedance was 513 ohms, the pacing threshold was 0.75 volts at 0.4 msec.  The bipolar P wave was 4.9 millivolts, impedance was 722 ohms, the pacing threshold was 1.0 volts at 0.4 msec. High-voltage impedance was  79 ohms.      The pocket was copiously irrigated with antibiotic containing saline solution. Hemostasis was assured, and the device and the leads were placed in the pocket and secured to the prepectoral fascia.  The wound was closed in 3 layers in normal fashion. The wound was washed dried and a DERMABOND dressing was then applied. Needle counts, sponge counts and instrument counts were correct at the end of the procedure according to the staff.   EBL minimal

## 2014-04-06 NOTE — Progress Notes (Addendum)
CTSP for site pain and concern for swelling. He has very minimal soft puffiness above the device site, and slight firmness to the skin below the device but without significant swelling. Contralateral side without device feels similar. Extensive pressure dressing already in place. Will leave on for tonight and see how he does overnight. Pain medication made available. EP to see in AM. Ronie Spiesayna Dunn PA-C

## 2014-04-06 NOTE — Interval H&P Note (Signed)
ICD Criteria  Current LVEF:60% ;Obtained > or = 1 month ago and < or = 3 months ago.  NYHA Functional Classification: Class II  Heart Failure History:  Yes, Duration of heart failure since onset is 3 to 9 months  Non-Ischemic Dilated Cardiomyopathy History:  No.  Atrial Fibrillation/Atrial Flutter:  No.  Ventricular Tachycardia History:  Yes, No hemodynamic instability. VT Type is unknown.  Cardiac Arrest History:  No  History of Syndromes with Risk of Sudden Death:  Yes, Other-Hypertrophic Cardiomyopathy  Previous ICD:  No.  Electrophysiology Study: No.  Prior MI: No.  PPM: No.  OSA:  Yes  Patient Life Expectancy of >=1 year: Yes.  Anticoagulation Therapy:  Patient is NOT on anticoagulation therapy.   Beta Blocker Therapy:  No, Reason not on Beta Blocker therapy: bradycardia  Ace Inhibitor/ARB Therapy:  Yes.History and Physical Interval Note:  04/06/2014 1:40 PM  Marc Bradford  has presented today for surgery, with the diagnosis of hypertrophic cardiomyopathy  The various methods of treatment have been discussed with the patient and family. After consideration of risks, benefits and other options for treatment, the patient has consented to  Procedure(s): IMPLANTABLE CARDIOVERTER DEFIBRILLATOR IMPLANT (N/A) as a surgical intervention .  The patient's history has been reviewed, patient examined, no change in status, stable for surgery.  I have reviewed the patient's chart and labs.  Questions were answered to the patient's satisfaction.     Sherryl MangesSteven Makelle Marrone

## 2014-04-06 NOTE — H&P (View-Only) (Signed)
      Patient Care Team: Lars MassonKatarina H Nelson, MD as PCP - General (Cardiology)   HPI  Marc Bradford is a 54 y.o. male Seen in follow-up for hypertrophic cardiomyopathy. He has a history of hypertension and a prior stroke with some residual weakness. The stroke was nonhemorrhagic. He was found to havesignificant hypertrophy with apical involvement. He subsequently underwent MRI which demonstrated 29 mm apical segments with significant gadolinium enhancement.  He continues to have episodes of shortness of breath. He has some peripheral edema. He has significant sleep-disordered breathing and daytime somnolence.  He has had no tachycardia palpitations suggestive of atrial fibrillation.    Past Medical History  Diagnosis Date  . Gout   . Hyperlipidemia   . Other hypertrophic cardiomyopathy 09/30/2013  . Left-sided weakness 09/28/2013  . Essential hypertension 09/30/2013    Past Surgical History  Procedure Laterality Date  . No past surgeries      Current Outpatient Prescriptions  Medication Sig Dispense Refill  . aspirin EC 81 MG tablet Take 81 mg by mouth daily.    . fenofibrate 160 MG tablet Take 1 tablet (160 mg total) by mouth daily. 30 tablet 2  . quinapril (ACCUPRIL) 40 MG tablet Take 40 mg by mouth daily.    . simvastatin (ZOCOR) 20 MG tablet Take 20 mg by mouth at bedtime.     No current facility-administered medications for this visit.    No Known Allergies  Review of Systems negative except from HPI and PMH  Physical Exam BP 142/82 mmHg  Pulse 65  Ht 5\' 6"  (1.676 m)  Wt 240 lb 3.2 oz (108.954 kg)  BMI 38.79 kg/m2 Well developed and .Morbidly obese in no acute distress HENT normal E scleral and icterus clear Neck Supple JVP?  carotids brisk and full Clear to ausculation  Regular rate and rhythm, no murmurs gallops or rub Soft with active bowel sounds No clubbing cyanosis1+} Edema Alert and oriented, grossly normal motor and sensory function Skin Warm  and Dry  Holter monitor demonstrates nonsustained ventricular tachycardia nocturnal bradycardia and a mean heart rate during the day of about 70   Assessment and  Plan  Ventricular tachycardia-nonsustained  Hypertrophic cardiomyopathy- apical variant  HFpEF  Hypertension  Stroke  Sleep disordered breathing  The patient has hypertrophic cardiomyopathy.  Risk stratification includes Holter monitoring which has demonstrated nonsustained ventricular tachycardia. This identify significantly increased risk for sudden cardiac death;  ICD is recommended to    reduce the risk of sudden cardiac death.   He also has heart failure. We will add a diuretic. It would be advantageous to have beta blockers; dual-chamber device implantation will allow adjunctive use of these medications.  He has sleep disordered breathing and daytime somnolence. He needs a sleep study.  I'm also concerned as to whether he has atrial fibrillation as the cause of his stroke. It is highly concordant with sleep apnea and hypertrophic cardiomyopathy. Dual-chamber device and will allow us to monitor for atrial fibrillation. This would prompt us to change   To  anticoagulation from aspirin   Adjunctive beta blockers will help with blood pressure control  Have reviewed the potential benefits and risks of ICD implantation including but not limited to death, perforation of heart or lung, lead dislodgement, infection,  device malfunction and inappropriate shocks.  The patient and family express understanding  and are willing to proceed.

## 2014-04-07 ENCOUNTER — Ambulatory Visit (HOSPITAL_COMMUNITY): Payer: Medicaid Other

## 2014-04-07 ENCOUNTER — Encounter (HOSPITAL_COMMUNITY): Payer: Self-pay | Admitting: *Deleted

## 2014-04-07 DIAGNOSIS — E785 Hyperlipidemia, unspecified: Secondary | ICD-10-CM | POA: Diagnosis not present

## 2014-04-07 DIAGNOSIS — I422 Other hypertrophic cardiomyopathy: Secondary | ICD-10-CM

## 2014-04-07 DIAGNOSIS — I509 Heart failure, unspecified: Secondary | ICD-10-CM

## 2014-04-07 DIAGNOSIS — I10 Essential (primary) hypertension: Secondary | ICD-10-CM | POA: Diagnosis not present

## 2014-04-07 MED ORDER — HYDROCODONE-ACETAMINOPHEN 5-325 MG PO TABS: 1.0000 | ORAL_TABLET | Freq: Four times a day (QID) | ORAL | Status: DC | PRN

## 2014-04-07 MED ORDER — PROPRANOLOL HCL ER 80 MG PO CP24: 80.0000 mg | ORAL_CAPSULE | Freq: Every day | ORAL | Status: DC

## 2014-04-07 NOTE — Discharge Instructions (Signed)
° ° °  Supplemental Discharge Instructions for  Pacemaker/Defibrillator Patients  Activity No heavy lifting or vigorous activity with your left/right arm for 6 to 8 weeks.  Do not raise your left/right arm above your head for one week.  Gradually raise your affected arm as drawn below.           __2/20/2016                  2/21                         2/22                   2/23  NO DRIVING for until seen by seen cardiologist  WOUND CARE - Keep the wound area clean and dry.  Do not get this area wet for one week. No showers for one week; you may shower on  04/14/2014   . - The tape/steri-strips on your wound will fall off; do not pull them off.  No bandage is needed on the site.  DO  NOT apply any creams, oils, or ointments to the wound area. - If you notice any drainage or discharge from the wound, any swelling or bruising at the site, or you develop a fever > 101? F after you are discharged home, call the office at once.  Special Instructions - You are still able to use cellular telephones; use the ear opposite the side where you have your pacemaker/defibrillator.  Avoid carrying your cellular phone near your device. - When traveling through airports, show security personnel your identification card to avoid being screened in the metal detectors.  Ask the security personnel to use the hand wand. - Avoid arc welding equipment, MRI testing (magnetic resonance imaging), TENS units (transcutaneous nerve stimulators).  Call the office for questions about other devices. - Avoid electrical appliances that are in poor condition or are not properly grounded. - Microwave ovens are safe to be near or to operate.  Additional information for defibrillator patients should your device go off: - If your device goes off ONCE and you feel fine afterward, notify the device clinic nurses. - If your device goes off ONCE and you do not feel well afterward, call 911. - If your device goes off TWICE, call  911. - If your device goes off THREE times in one day, call 911.  DO NOT DRIVE YOURSELF OR A FAMILY MEMBER WITH A DEFIBRILLATOR TO THE HOSPITAL--CALL 911.

## 2014-04-07 NOTE — Discharge Summary (Signed)
     ELECTROPHYSIOLOGY PROCEDURE DISCHARGE SUMMARY    Patient ID: Marc Bradford,  MRN: [id number], DOB/AGE: 54/06/1960 54 y.o.  Admit date: 04/06/2014 Discharge date: 04/07/2014  Primary Care Physician: Lars MassonNELSON, KATARINA H, MD Primary Cardiologist: Delton SeeNelson Electrophysiologist: Graciela HusbandsKlein  Primary Discharge Diagnosis:  1.  Hypertrophic cardiomyopathy status post ICD implantation this admission  Secondary Discharge Diagnosis:  1.  Hypertension 2.  CVA 3.  Hyperlipidemia 4.  Gout  No Known Allergies   Procedures This Admission:  1.  Implantation of a MDT dual chamber ICD on 04-06-14 by Dr Graciela HusbandsKlein.  The patient received a MDT MRI compatible ICD with model number 5076 right atrial lead, 6935 right ventricular lead.  DFT's were deferred at time of implant.  There were no immediate post procedure complications. 2.  CXR on 04-07-14 demonstrated no pneumothorax status post device implantation.   Brief HPI: Marc DecemberJohnny Menzer is a 54 y.o. male was referred to electrophysiology in the outpatient setting for consideration of ICD implantation.  Past medical history includes hypertrophic cardiomyopathy, hypertension, and prior CVA.  Risks, benefits, and alternatives to ICD implantation were reviewed with the patient who wished to proceed.   Hospital Course:  The patient was admitted and underwent implantation of a MDT MRI compatible dual chamber ICD with details as outlined above.   He was monitored on telemetry overnight which demonstrated sinus rhythm with occasional A pacing.  Left chest was without hematoma or ecchymosis.  The device was interrogated and found to be functioning normally.  CXR was obtained and demonstrated no pneumothorax status post device implantation.  Wound care, arm mobility, and restrictions were reviewed with the patient.  Dr Graciela HusbandsKlein examined the patient and considered them stable for discharge to home.   The patient's discharge medications include an ACE-I (Quinapril) and beta blocker  started at discharge   Discharge Vitals: Blood pressure 134/77, pulse 64, temperature 98.1 F (36.7 C), temperature source Oral, resp. rate 20, height 5\' 6"  (1.676 m), weight 240 lb 4.8 oz (109 kg), SpO2 98 %.  Well developed and nourished in no acute distress HENT normal Neck sopple Pocket without heamtomo Regular rate and rhythm, no murmurs or gallops Abd-soft with active BS No Clubbing cyanosis edema Skin-warm and dry A & Oriented  Grossly normal sensory and motor function  Labs:   Lab Results  Component Value Date   WBC 5.3 04/04/2014   HGB 14.3 04/04/2014   HCT 42.1 04/04/2014   MCV 94.3 04/04/2014   PLT 229.0 04/04/2014    Recent Labs Lab 04/04/14 1130  NA 137  K 4.0  CL 101  CO2 30  BUN 12  CREATININE 1.22  CALCIUM 9.6  GLUCOSE 139*    Discharge Medications:    Medication List    ASK your doctor about these medications        aspirin EC 81 MG tablet  Take 81 mg by mouth daily.     fenofibrate 160 MG tablet  Take 1 tablet (160 mg total) by mouth daily.     quinapril 40 MG tablet  Commonly known as:  ACCUPRIL  Take 40 mg by mouth daily.     simvastatin 20 MG tablet  Commonly known as:  ZOCOR  Take 20 mg by mouth at bedtime.     Inderal LA 80     Disposition:     Duration of Discharge Encounter: Greater than 30 minutes including physician time.  Signed,

## 2014-04-09 ENCOUNTER — Telehealth: Payer: Self-pay | Admitting: Internal Medicine

## 2014-04-09 NOTE — Telephone Encounter (Signed)
Ms. Marc Bradford called about her husband who had an ICD placed a few days ago. We discussed mild swelling around the device. She says it is mildly indurated and red (no change in redness from implant) but no fever/chills. The pocket does not feel soft. Mr. Marc Bradford is not in any more pain. We discussed that any changes (enlarging size of the device) should require a check in urgent care or ER this weekend, otherwise call the clinic on Monday for further review. She expressed understanding.

## 2014-04-11 ENCOUNTER — Telehealth: Payer: Self-pay | Admitting: *Deleted

## 2014-04-11 NOTE — Telephone Encounter (Signed)
Spoke w/pt's wife in regards to swelling at site. Per pt and wife swelling has increased especially when laying down.  Pt scheduled for wound check on 04-12-14 @ 900.

## 2014-04-12 ENCOUNTER — Ambulatory Visit (INDEPENDENT_AMBULATORY_CARE_PROVIDER_SITE_OTHER): Payer: Self-pay | Admitting: *Deleted

## 2014-04-12 DIAGNOSIS — I422 Other hypertrophic cardiomyopathy: Secondary | ICD-10-CM

## 2014-04-12 LAB — MDC_IDC_ENUM_SESS_TYPE_INCLINIC
Battery Voltage: 3.12 V
Brady Statistic AP VS Percent: 40.89 %
Brady Statistic AS VP Percent: 0.03 %
Brady Statistic AS VS Percent: 59.03 %
Brady Statistic RA Percent Paced: 40.94 %
Brady Statistic RV Percent Paced: 0.08 %
Date Time Interrogation Session: 20160223093343
HIGH POWER IMPEDANCE MEASURED VALUE: 190 Ohm
HighPow Impedance: 50 Ohm
Lead Channel Impedance Value: 456 Ohm
Lead Channel Pacing Threshold Amplitude: 0.75 V
Lead Channel Pacing Threshold Amplitude: 0.75 V
Lead Channel Pacing Threshold Pulse Width: 0.4 ms
Lead Channel Pacing Threshold Pulse Width: 0.4 ms
Lead Channel Sensing Intrinsic Amplitude: 20 mV
Lead Channel Sensing Intrinsic Amplitude: 4 mV
Lead Channel Setting Pacing Amplitude: 3.5 V
Lead Channel Setting Pacing Pulse Width: 0.4 ms
MDC IDC MSMT BATTERY REMAINING LONGEVITY: 127 mo
MDC IDC MSMT LEADCHNL RA IMPEDANCE VALUE: 551 Ohm
MDC IDC SET LEADCHNL RV PACING AMPLITUDE: 3.5 V
MDC IDC SET LEADCHNL RV SENSING SENSITIVITY: 0.3 mV
MDC IDC SET ZONE DETECTION INTERVAL: 250 ms
MDC IDC SET ZONE DETECTION INTERVAL: 350 ms
MDC IDC SET ZONE DETECTION INTERVAL: 450 ms
MDC IDC STAT BRADY AP VP PERCENT: 0.05 %
Zone Setting Detection Interval: 300 ms

## 2014-04-12 NOTE — Progress Notes (Signed)
Wound check appointment. Steri-strips removed. Wound with mild swelling--GT evaluated and instructed pt to call with any changes. Incision edges approximated, wound well healed. Normal device function. Thresholds, sensing, and impedances consistent with implant measurements. Device programmed at 3.5V for extra safety margin until 3 month visit. Histogram distribution appropriate for patient and level of activity. No mode switches or ventricular arrhythmias noted. Patient educated about wound care, arm mobility, lifting restrictions, shock plan. ROV in 3 months with SK.

## 2014-04-18 ENCOUNTER — Ambulatory Visit: Payer: Self-pay

## 2014-04-20 ENCOUNTER — Emergency Department: Payer: Self-pay | Admitting: Emergency Medicine

## 2014-04-28 ENCOUNTER — Encounter: Payer: Self-pay | Admitting: Internal Medicine

## 2014-05-06 ENCOUNTER — Telehealth: Payer: Self-pay | Admitting: Internal Medicine

## 2014-05-06 NOTE — Telephone Encounter (Signed)
New BP medication "making my head hurt and my neck hurt". Indicated it is a sharp pain that started one day after he started the medication. Patient has not checked BP recently though. States he is unsure if BP is or "feels any higher". Encouraged patient to get BP checked today while I route message to Dr. Graciela HusbandsKlein for his advisement. Patient denies any other symptoms. He states the headache is bad enough to where he does not want to take this medication. Routed to Dr. Graciela HusbandsKlein.

## 2014-05-06 NOTE — Telephone Encounter (Signed)
New message     Patient calling     Pt C/O medication issue:  1. Name of Medication: propranolol  80 mg   2. How are you currently taking this medication (dosage and times per day)? Took in am with other blood pressure. Last night took medication   3. Are you having a reaction (difficulty breathing--STAT)? Bad headache. Around neck   4. What is your medication issue? alternative or something different.

## 2014-05-11 ENCOUNTER — Telehealth: Payer: Self-pay | Admitting: Internal Medicine

## 2014-05-11 NOTE — Telephone Encounter (Signed)
New message      Did you have a form from disability quality control on this pt?  They are needing it completed and faxed back soon.

## 2014-05-11 NOTE — Telephone Encounter (Signed)
Forwarding to medical records to address

## 2014-05-12 ENCOUNTER — Telehealth: Payer: Self-pay | Admitting: Internal Medicine

## 2014-05-12 NOTE — Telephone Encounter (Signed)
Staff message sent to [employer] for them to call the Patient and let him know the status of his paperwork.

## 2014-05-16 NOTE — Telephone Encounter (Signed)
Spoke with Selena BattenKim in medical records who spoke with [facility] last week.  They were to call patient.

## 2014-06-08 ENCOUNTER — Ambulatory Visit (HOSPITAL_BASED_OUTPATIENT_CLINIC_OR_DEPARTMENT_OTHER): Payer: Medicaid Other | Attending: Internal Medicine

## 2014-06-08 VITALS — Ht 66.0 in | Wt 230.0 lb

## 2014-06-08 DIAGNOSIS — R400 Somnolence: Secondary | ICD-10-CM

## 2014-06-08 DIAGNOSIS — R0683 Snoring: Secondary | ICD-10-CM | POA: Insufficient documentation

## 2014-06-08 DIAGNOSIS — G4730 Sleep apnea, unspecified: Secondary | ICD-10-CM

## 2014-06-08 DIAGNOSIS — G4733 Obstructive sleep apnea (adult) (pediatric): Secondary | ICD-10-CM | POA: Insufficient documentation

## 2014-06-08 DIAGNOSIS — I493 Ventricular premature depolarization: Secondary | ICD-10-CM | POA: Insufficient documentation

## 2014-06-08 DIAGNOSIS — G4710 Hypersomnia, unspecified: Secondary | ICD-10-CM | POA: Diagnosis present

## 2014-06-11 NOTE — Consult Note (Signed)
PATIENT NAME:  Marc Bradford, Kyuss MR#:  [id number] DATE OF BIRTH:  23-Apr-1960  DATE OF CONSULTATION:  12/04/2013  CONSULTING PHYSICIAN:  Towana Stenglein K. Ova Meegan, MD  SUBJECTIVE:   The patient was seen in consultation in Room BHU4 at [facility], [place], [place].  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   The patient is married for 21 years and lives with his wife.  The patient came to [facility] Emergency Room with a chief complaint of "depressed since I am not working and  not able to pay my bills and I am having lots of bad luck from same."   PAST PSYCHIATRIC HISTORY:  No previous history on the patient's psychiatry.  No history of suicide attempts or being followed by any psychiatrist.    ALCOHOL AND DRUGS:  He admitted that he had been drinking at the rate of 3 or 4 beers a day, but he cut down a lot, recently after the CVA.  He had a DWI in 1980 and lost his license and got his driver's license back.  He denies any other street drug or prescription drug abuse.  He denies any smoking nicotine cigarettes.   PAST MEDICAL HISTORY:  He had a CVA several days ago.  He is not able to go back to work since then.     MENTAL STATUS: The patient is alert and oriented to place, person and time, conversant and cooperative, no agitation.  Affect is neutral and stable.   The patient reports "I feel better after I talked to somebody.  I don't have any suicide plan."   According to the information obtained from the staff, the patient voiced suicidal thoughts and admitted that he has been hearing voices and seeing things which he denies currently.  He reports it was all in his mind and he feels safe here.  Cognition intact.  Marland Kitchen.  He denies any suicidal or homicidal plans and eager to go home and get help on own.  The patient's insight and judgment fair.  Impulse control fair.  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  I recommend discharge the patient back home and give him a follow up appointment on an outpatient basis where he can be started on antidepressant medications and be helped as needed with supportive therapy and coping skills in dealing with the stressors of like.        __________________________ Jannet MantisSurya K. Guss Bundehalla, MD skc:DT D: 12/04/2013 13:29:31 ET T: 12/04/2013 14:59:09 ET JOB#: [id number]  cc: Monika SalkSurya K. Guss Bundehalla, MD, <Dictator> Beau FannySURYA K Avilene Marrin MD ELECTRONICALLY SIGNED 12/05/2013 11:10

## 2014-06-12 NOTE — H&P (Signed)
PATIENT NAME:  Marc Bradford, Notnamed MR#:  [id number] DATE OF BIRTH:  Jun 03, 1960  DATE OF ADMISSION:  05/23/2011  REFERRING PHYSICIAN: Dr. Marilynne HalstedBoland FAMILY PHYSICIAN: [facility]   REASON FOR ADMISSION: Right-sided weakness with pain.   HISTORY OF PRESENT ILLNESS: The patient is a 54 year old male with a history of gout, benign hypertension, and hyperlipidemia who presents to the Emergency Room with acute onset of right-sided weakness associated with "shooting, sharp" pain. Pain is subsiding although the weakness persists. In the Emergency Room, patient's head CT was negative. Denies problems with speech or swallowing. Blood pressure is elevated in the Emergency Room. He is now admitted for further evaluation.   PAST MEDICAL HISTORY:  1. Obesity.  2. Benign hypertension.  3. Hyperlipidemia. 4. Gout.    MEDICATIONS:  1. Aspirin 81 mg p.o. daily.  2. Lipitor 10 mg p.o. at bedtime.  3. Accupril 20 mg p.o. daily.   ALLERGIES: No known drug allergies.   SOCIAL HISTORY: Negative for alcohol or tobacco abuse.   FAMILY HISTORY: Positive for diabetes, hypertension, and coronary artery disease.    REVIEW OF SYSTEMS: CONSTITUTIONAL: No fever or change in weight. EYES: No blurred or double vision. No glaucoma. ENT: No tinnitus or hearing loss. No nasal discharge or bleeding. No difficulty swallowing. RESPIRATORY: No cough or wheezing. Denies hemoptysis or painful respirations. CARDIOVASCULAR: No chest pain or orthopnea. No palpitations or syncope. GASTROINTESTINAL: No nausea, vomiting or diarrhea. No abdominal pain. No change in bowel habits. GENITOURINARY: No dysuria or hematuria. No incontinence. ENDOCRINE: No polyuria or polydipsia. No heat or cold intolerance. HEMATOLOGIC: Patient denies anemia, easy bruising, or bleeding. LYMPHATIC: No swollen glands. MUSCULOSKELETAL: Patient denies pain in his neck, back, shoulders, knees, or hips. Does have a history of gout. NEUROLOGIC: Has numbness and weakness as  per history of present illness. Denies migraines. No previous stroke. Denies seizures. PSYCH: The patient denies anxiety, insomnia, or depression.   PHYSICAL EXAMINATION:  GENERAL: Patient is obese, no acute distress.   VITAL SIGNS: Currently are remarkable for a blood pressure of 180/79 with a heart rate of 60 and a respiratory rate of 18.   HEENT: Normocephalic, atraumatic. Pupils equally round and reactive to light and accommodation. Extraocular movements are intact. Sclerae are not icteric. Conjunctivae are clear. Oropharynx is dry but clear.   NECK: Supple without jugular venous distention or bruits. No adenopathy or thyromegaly is noted.   LUNGS: Clear to auscultation and percussion without wheezes, rales, or rhonchi. No dullness.   CARDIAC: Regular rate and rhythm with a normal S1 and S2. There is a 2/6 systolic murmur noted. No rubs or gallops present.   ABDOMEN: Soft, nontender with normoactive bowel sounds. No organomegaly or masses were appreciated. No hernias or bruits were noted.   EXTREMITIES: Without clubbing, cyanosis, edema. Pulses were 2+ bilaterally.   SKIN: Warm and dry without rash or lesions.   NEUROLOGIC: Cranial nerves II through XII grossly intact. Deep tendon reflexes were symmetric. Motor exam revealed 4/5 strength in the right upper and lower extremities. Left motor function was normal. Sensory exam was nonfocal.   PSYCH: Exam revealed a patient who was alert and oriented to person, place, and time. He was cooperative and used good judgment.   LABORATORY, DIAGNOSTIC AND RADIOLOGICAL DATA: Urinalysis was negative. Troponin was 0.04. White count was 6.1 with a hemoglobin of 13.1. Glucose 111 with a BUN of 17 and a creatinine of 1.15 with a GFR of greater than 60. Head CT revealed no acute intracranial  abnormality. EKG revealed sinus rhythm with no acute ischemic changes.   ASSESSMENT:  1. Right-sided weakness worrisome for acute stroke syndrome.  2. Accelerated  hypertension.  3. Hyperlipidemia.  4. Gout.  5. Obesity.   PLAN: Patient will be admitted to the telemetry floor with aspirin and Lovenox. Will optimize his blood pressure medications at this time and monitor him closely with vital signs and neuro checks q.4 hours. Will obtain carotid Doppler's and an MRI of the brain. Will consult physical therapy and neurology. Follow up routine labs in the morning. Will check on his lipid status as well in the morning. Further treatment and evaluation will depend upon the patient's progress.   TOTAL TIME SPENT ON THIS PATIENT: 50 minutes.    ____________________________ Duane Lope Judithann Sheen, MD jds:cms D: 05/23/2011 20:59:05 ET T: 05/24/2011 06:28:11 ET JOB#: [id number]  cc: Duane Lope. Judithann Sheen, MD, <Dictator> [facility]  Sigmund Morera Rodena Medin MD ELECTRONICALLY SIGNED 05/24/2011 8:09

## 2014-06-12 NOTE — Discharge Summary (Signed)
PATIENT NAME:  Marc Bradford, [name] MR#:  [id number] DATE OF BIRTH:  1960-04-17  DATE OF ADMISSION:  05/23/2011 DATE OF DISCHARGE:  05/24/2011  For a detailed note, please take a look at the history and physical done by Dr. Aram BeechamJeffrey Sparks on admission.   DIAGNOSES AT DISCHARGE:  1. Right-sided weakness, suspected transient ischemic attack, much improved.  2. Hypertension.  3. Hyperlipidemia.  4. Obesity.   DIET: Patient was discharged on a low-sodium, low-fat diet.   ACTIVITY: As tolerated.   FOLLOW UP: With [facility] in the next 1 to 2 weeks.    DISCHARGE MEDICATIONS:  1. Aspirin 81 mg daily.  2. Lipitor 10 mg daily.  3. Accupril 40 mg daily.   CONSULTANT DURING THE HOSPITAL COURSE: Dr. Cristopher PeruHemang Shah from neurology.   LABORATORY, DIAGNOSTIC AND RADIOLOGICAL DATA: CT scan of the head done on admission without contrast showing no acute intracranial abnormality. A two-dimensional echocardiogram done showing mildly reduced LV function, ejection fraction of 45%, mild mitral regurgitation and tricuspid regurgitation. A carotid duplex showing no significant atherosclerotic plaque. No evidence of hemodynamically significant carotid stenosis with antegrade flow in both vertebrals. MRI of the brain done without contrast showing mild white matter changes in the left frontoparietal white matter most consistent with chronic white matter ischemia. No acute ischemia noted.   HOSPITAL COURSE: This is a [age] year old male with medical problems as mentioned above presented to the hospital on 05/23/2011 due to right-sided weakness and pain.  1. Right-sided weakness and pain. Suspicious for possible transient ischemic attack. Patient did have significant risk factors given his obesity, hypertension, hyperlipidemia. He therefore was admitted to the hospital, underwent extensive testing including CT of the head, a carotid duplex, echocardiogram and MRI of the brain the results of which are mentioned above.  None of those tests showed any significant abnormality. He was pretty much asymptomatic. Prior to being discharged his pain and weakness had resolved. He was strongly advised to follow precautions for secondary stroke prevention like using aspirin daily, using a statin, weight loss, exercise. Consultation from neurology was obtained who agreed with this management.  2. Hypertension. Patient remained stable on his home medications including Accupril. He was discharged on that.  3. Hyperlipidemia. Patient will resume his Lipitor which is what he was taking at home. He did have a lipid profile checked which showed LDL 34, VLDL 58, total cholesterol 045120. His triglycerides were mildly elevated at 288.  4. CODE STATUS: Patient is a FULL CODE.   TIME SPENT WITH DISCHARGE: 35 minutes.  ____________________________ Rolly PancakeVivek J. Cherlynn KaiserSainani, MD vjs:cms D: 05/28/2011 13:22:03 ET T: 05/29/2011 10:08:04 ET JOB#: [id number]  cc: Rolly PancakeVivek J. Cherlynn KaiserSainani, MD, <Dictator> [facility] SirenVIVEK J SAINANI MD ELECTRONICALLY SIGNED 05/31/2011 14:49

## 2014-06-12 NOTE — Consult Note (Signed)
PATIENT NAME:  Marc Bradford, Marc Bradford Marc#:  [id number] DATE OF BIRTH:  Jun 24, 1960  DATE OF CONSULTATION:  05/24/2011  REFERRING PHYSICIAN:  Hilda LiasVivek Sainani, MD CONSULTING PHYSICIAN:  Quintavia Rogstad K. Sherryll BurgerShah, MD PRIMARY CARE PHYSICIAN: [facility].     REASON FOR CONSULTATION: Right-sided weakness.   HISTORY OF PRESENT ILLNESS: Marc Bradford is a 54 year old African American gentleman who was walking in his neighborhood on May 23, 2011, and had some numbness on his right hand which developed to his right leg. He also noticed some weakness in his right arm and right leg.   He was having some pain in his calf. He came to the ER, received MRI of the brain which was unremarkable.   He felt like his strength has improved.   PAST MEDICAL HISTORY: Significant for:  1. Obesity.   2. Benign hypertension.   3. Hyperlipidemia.   4. Gout.   MEDICATIONS AT HOME. Aspirin 81 mg, Lipitor 10 mg at nighttime, and Accupril 20 mg.   PAST SURGICAL HISTORY: Negative.   SOCIAL HISTORY: Significant that he used to use tobacco; now he has been trying to quit. He does not use alcohol. He lives with his wife.   FAMILY HISTORY: Significant for diabetes, hypertension, coronary artery disease.    REVIEW OF SYSTEMS: Positive for right foot pain and right calf pain. On exam the rest of the 10 system review of systems was negative.   PHYSICAL EXAMINATION:   VITAL SIGNS: Temperature was 97.9, pulse 58, respiratory rate 18, blood pressure 151/91, pulse oximetry 98%.   GENERAL: He is a middle-aged African American gentleman sitting in bed surrounded by multiple family members.   LUNGS: His lungs were clear to auscultation.   HEART: S1, S2 heart sounds. Carotid exam did not reveal any bruit.   NEUROLOGIC/MENTAL STATUS: He was alert, oriented, followed two-step, inverted commands. His attention, concentration, and memory seem to be appropriate for his age and medical condition.   I did not notice any language impairment.   On  his cranial nerves his pupils are equal, round, and reactive. Extraocular movements are intact. His face was symmetric. Tongue was midline. Facial sensations were intact. His hearing was intact. His shoulder shrug was normal.   On his motor examination, he has a normal tone and strength on his upper extremity. On his lower extremity he had some weakness of the right hip flexion but he was able to stand up okay. He does have some pain in his right ankle region.   His sensations were intact to light touch. His deep tendon reflexes are symmetric. His toes are downgoing.   His gait was okay but a little bit antalgic.   REVIEW OF RADIOLOGICAL DATA: His MRI of the brain was unremarkable. His ultrasound of the carotid vessels did not show any hemodynamic stenosis.   ASSESSMENT AND PLAN: Acute onset of right-sided numbness and weakness which has gradually resolved in a patient with a history of hypertension, hyperlipidemia, and tobacco abuse. Might represent MRI-negative stroke. Advised him to use 325 mg regular dose of aspirin and a statin, talk to him about secondary stroke prevention strategies such as having a good control of his blood pressure, exercise, avoid smoking, having good control of his cholesterol, exercise regularly.   I also talked to him about importance of calling 911 if he has any new neurological deficit.   Hi pain is not very well explained with a stroke. I talked to him that pain is not a common symptom of  stroke, and we will let his primary care physician further evaluate his right ankle pain.   He does not have deep vein thrombosis type changes.   I will see him on a p.r.n. basis. Feel free to contact me with any further questions.      ____________________________ Durene Cal. Sherryll Burger, MD hks:vtd D: 05/24/2011 17:10:02 ET T: 05/25/2011 08:41:45 ET JOB#: [id number]  cc: Jatorian Renault K. Sherryll Burger, MD, <Dictator> [facility] Durene Cal [facility] MD ELECTRONICALLY SIGNED  05/25/2011 13:37

## 2014-06-13 ENCOUNTER — Emergency Department
Admit: 2014-06-13 | Disposition: A | Discharge status: Home / Self Care | Payer: Self-pay | Admitting: Emergency Medicine

## 2014-06-13 LAB — BASIC METABOLIC PANEL
Anion Gap: 8 (ref 7–16)
BUN: 17 mg/dL
CREATININE: 0.97 mg/dL
Calcium, Total: 9 mg/dL
Chloride: 106 mmol/L
Co2: 23 mmol/L
EGFR (Non-African Amer.): >60
Glucose: 144 mg/dL — ABNORMAL HIGH
Potassium: 3.9 mmol/L
Sodium: 137 mmol/L

## 2014-06-13 LAB — CBC
HCT: 39.2 % — AB (ref 40.0–52.0)
HGB: 13 g/dL (ref 13.0–18.0)
MCH: 31 pg (ref 26.0–34.0)
MCHC: 33.1 g/dL (ref 32.0–36.0)
MCV: 94 fL (ref 80–100)
PLATELETS: 193 10*3/uL (ref 150–440)
RBC: 4.19 10*6/uL — ABNORMAL LOW (ref 4.40–5.90)
RDW: 14.7 % — AB (ref 11.5–14.5)
WBC: 5.1 10*3/uL (ref 3.8–10.6)

## 2014-06-13 LAB — TROPONIN I
TROPONIN-I: 0.04 ng/mL — AB
Troponin-I: 0.04 ng/mL — ABNORMAL HIGH

## 2014-06-15 ENCOUNTER — Telehealth: Payer: Self-pay | Admitting: Cardiology

## 2014-06-15 NOTE — Addendum Note (Signed)
Addended by: Quintella ReichertURNER, TRACI R on: 06/15/2014 09:28 AM   Modules accepted: Orders

## 2014-06-15 NOTE — Telephone Encounter (Signed)
Please let patient know that they have sleep apnea and had successful PAP titration. Please setup appointment in 10 weeks. Please let [facility] know that order for PAP is in EPIC.   

## 2014-06-15 NOTE — Sleep Study (Signed)
NAME: Marc Bradford DATE OF BIRTH:  06/29/1960 MEDICAL RECORD NUMBER [id number]  LOCATION: [facility]  PHYSICIAN: Xyla Leisner R  DATE OF STUDY: 06/08/2014  SLEEP STUDY TYPE: Nocturnal Polysomnogram               REFERRING PHYSICIAN: Duke SalviaKlein, Steven C, MD  INDICATION FOR STUDY: excessive daytime sleepiness, snoring  EPWORTH SLEEPINESS SCORE: 6 HEIGHT: 5\' 6"  (167.6 cm)  WEIGHT: 230 lb (104.327 kg)    Body mass index is 37.14 kg/(m^2).  NECK SIZE: 18 in.  MEDICATIONS: Reviewed in the chart  SLEEP ARCHITECTURE: During the diagnostic portion of the study, the patient slept for a total of 119 minutes out of a total of 155 minutes.  There was no slow wave sleep and no REM sleep.  The onset to sleep latency was prolonged at 39.5 minutes.  The sleep efficiency was reduced at 61%.  During the CPAP titration, the patient slept for a total of 158 minutes out of a total sleep time of 193 minutes.  The onset to REM sleep latency was short at 46.5 minutes.  The sleep efficiency was slightly reduced at 82%.    RESPIRATORY DATA: During the diagnostic portion of the study, there were 147 apneas, of which, 124 were obstructive and 23 were central events.  There were 8 hypopneas.  The AHI was 77.8 events per hour consistent with severe obstructive sleep apnea/hypopnea syndrome.  Most events occurred the non supine position and in NREM sleep.  There was severe snoring noted.  The patient was started on CPAP at 4cm H2O and titrated for respiratory events and snoring to 11cm H2O.  The patient was able to achieve REM supine sleep at an optimum pressure of 11cm H2O without any further respiratory events.  The AHI was 4.2 events at 11cm H2O.    OXYGEN DATA: During the diagnostic portion of the study, the average oxygen saturation was 95% and the lowest oxygen saturation was 80%.  The was no significant time with oxygen saturations below 88%.  During the CPAP titration, the average oxygen  saturation was 96% and the lowest oxygen saturation was 89%.    CARDIAC DATA: The patient maintained NSR with PVC's and ventricular couplets and intermittent accelerated idioventricular rhythm.  The average heart rate was 60 bpm and the lowest heart rate was 30 bpm.  The highest heart rate was 144 bpm.     MOVEMENT/PARASOMNIA: There were no periodic limb movement disorders or REM sleep behavior disorders.  IMPRESSION/ RECOMMENDATION:   1.  Severe obstructive sleep apnea/hypopnea syndrome with an AHI of 77.8 events per hour.   Most events occurred the non supine position and in NREM sleep. 2.  Severe snoring was noted.   3.  Reduced sleep efficiency with increased frequency of arousals due to respiratory events. 4.  Abnormal sleep architecture with no slow wave or REM sleep. 5.  NSR was noted with PVC's.  There were times where the rate sped up with loss or P waves and change in QRS morphology - ? Accelerated idioventricular rhythm 6.  Oxygen desaturations as low as 80% with respiratory events during the diagnostic portion. 7.  Successful CPAP titration to 11cm H2O.   The patient was able to achieve REM supine sleep at an optimum pressure of 11cm H2O without any further respiratory events.  The AHI was 4.2 events at 11cm H2O.   8.  The patient should be counseled in good sleep hygiene and weight loss.  9.  Recommend ResMed CPAP with heated humidity, extra large Respironics Wisp Nasal cushion mask set at CPAP to 11cm H2O.    Signed: Quintella Reichert Diplomate, American Board of Sleep Medicine  ELECTRONICALLY SIGNED ON:  06/15/2014, 9:09 AM [facility] PH: [phone]   FX: [phone] ACCREDITED BY THE AMERICAN ACADEMY OF SLEEP MEDICINE

## 2014-06-16 NOTE — Telephone Encounter (Signed)
Informed patient of results and verbal understanding expressed.  FU OV scheduled for 7/11. Notified [facility] orders are in EPIC.

## 2014-07-05 ENCOUNTER — Telehealth: Payer: Self-pay | Admitting: *Deleted

## 2014-07-05 NOTE — Telephone Encounter (Signed)
Spoke with patient to see if he had [facility]. He said that his insurance is still pending. I talked with him about the Sleep Assistance Program, but he stated he did not have the $100 for the program.  He is going to call his contact with the insurance today to see when it will be active so that he can get his machine.  He stated that he would call me back and let me know what is going on with the insurance.

## 2014-07-05 NOTE — Telephone Encounter (Signed)
Spoke with Patient/wife to let them know that [employer] through [facility] has made the donation on their behalf for the CPAP machine. [facility] has been notified, and CPAP Assistance forms have been faxed. Patient was very grateful for all the help.

## 2014-07-08 ENCOUNTER — Encounter: Payer: Self-pay | Admitting: Internal Medicine

## 2014-07-08 ENCOUNTER — Ambulatory Visit (INDEPENDENT_AMBULATORY_CARE_PROVIDER_SITE_OTHER): Payer: Self-pay | Admitting: Internal Medicine

## 2014-07-08 VITALS — BP 126/74 | HR 77 | Ht 66.0 in | Wt 238.6 lb

## 2014-07-08 DIAGNOSIS — I422 Other hypertrophic cardiomyopathy: Secondary | ICD-10-CM

## 2014-07-08 DIAGNOSIS — I4729 Other ventricular tachycardia: Secondary | ICD-10-CM

## 2014-07-08 DIAGNOSIS — Z4502 Encounter for adjustment and management of automatic implantable cardiac defibrillator: Secondary | ICD-10-CM

## 2014-07-08 DIAGNOSIS — I472 Ventricular tachycardia: Secondary | ICD-10-CM

## 2014-07-08 LAB — CUP PACEART INCLINIC DEVICE CHECK
Battery Voltage: 3.12 V
Brady Statistic AP VP Percent: 0.2 %
Brady Statistic AP VS Percent: 40.59 %
Brady Statistic AS VS Percent: 59.13 %
Brady Statistic RA Percent Paced: 40.8 %
Date Time Interrogation Session: 20160520150914
HIGH POWER IMPEDANCE MEASURED VALUE: 190 Ohm
HighPow Impedance: 60 Ohm
Lead Channel Pacing Threshold Amplitude: 0.5 V
Lead Channel Pacing Threshold Amplitude: 1 V
Lead Channel Pacing Threshold Pulse Width: 0.4 ms
Lead Channel Pacing Threshold Pulse Width: 0.4 ms
Lead Channel Sensing Intrinsic Amplitude: 31.625 mV
Lead Channel Setting Pacing Amplitude: 2.5 V
Lead Channel Setting Pacing Pulse Width: 0.4 ms
Lead Channel Setting Sensing Sensitivity: 0.3 mV
MDC IDC MSMT BATTERY REMAINING LONGEVITY: 127 mo
MDC IDC MSMT LEADCHNL RA IMPEDANCE VALUE: 456 Ohm
MDC IDC MSMT LEADCHNL RA SENSING INTR AMPL: 4.75 mV
MDC IDC MSMT LEADCHNL RV IMPEDANCE VALUE: 456 Ohm
MDC IDC SET LEADCHNL RA PACING AMPLITUDE: 1.5 V
MDC IDC SET ZONE DETECTION INTERVAL: 250 ms
MDC IDC SET ZONE DETECTION INTERVAL: 300 ms
MDC IDC STAT BRADY AS VP PERCENT: 0.07 %
MDC IDC STAT BRADY RV PERCENT PACED: 0.27 %
Zone Setting Detection Interval: 350 ms
Zone Setting Detection Interval: 450 ms

## 2014-07-08 NOTE — Progress Notes (Signed)
Electrophysiology Office Note   Date:  07/08/2014   ID:  Marc Bradford, DOB 03/26/1960, MRN [id number]  PCP:  Lars MassonNELSON, KATARINA H, MD  Cardiologist:  KN Primary Electrophysiologist:    Sherryl MangesSteven Payam Gribble, MD    Chief Complaint  Patient presents with  . Pacemaker Check     History of Present Illness: Marc Bradford is a 54 y.o. male is      Seen for hypertrophic cardiomyopathy with significant apical involvement 29 mm segments and gadolinium enhancement. for which he received an ICD for primary prevention  risk stratification also demonstrated nonsustained ventricular tachycardia.  He has a history of HFpEF  He also has a history of hypertension and prior stroke with residual weakness.  He has complaints of shoulder soreness in his left arm. This has worsened since implantation.   Today, he denies  symptoms of palpitations, chest pain,  He does have some shortness of breath, lower extremity edema, claudication ;  this improved with the use of diuretics but he has run out.    But no dizziness, presyncope, syncope, bleeding, or neurologic sequela.    The patient is tolerating medications without difficulties and is otherwise without complaint today.    Past Medical History  Diagnosis Date  . Gout   . Hyperlipidemia   . 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 s/p MDT dual chamber ICD implant 03/2014 followed by Dr Graciela HusbandsKlein  . Hypertension 09/30/2013  . OSA (obstructive sleep apnea)   . Stroke 09/22/2013    a. non-hemorrhagic b. residual left sided weakness   Past Surgical History  Procedure Laterality Date  . Implantable cardioverter defibrillator implant N/A 04/06/2014    MDT MRI compatible dual chamber ICD implanted by Dr Graciela HusbandsKlein     Current Outpatient Prescriptions  Medication Sig Dispense Refill  . aspirin EC 81 MG tablet Take 81 mg by mouth daily.    . fenofibrate 160 MG tablet Take 1 tablet (160 mg total) by mouth daily. 30 tablet 2  . HYDROcodone-acetaminophen  (NORCO/VICODIN) 5-325 MG per tablet Take 1 tablet by mouth every 6 (six) hours as needed for moderate pain or severe pain. 10 tablet 0  . propranolol ER (INDERAL LA) 80 MG 24 hr capsule Take 1 capsule (80 mg total) by mouth daily. 30 capsule 5  . quinapril (ACCUPRIL) 40 MG tablet Take 40 mg by mouth daily.    . simvastatin (ZOCOR) 20 MG tablet Take 20 mg by mouth at bedtime.     No current facility-administered medications for this visit.    Allergies:   Review of patient's allergies indicates no known allergies.   Social History:  The patient  reports that he has never smoked. His smokeless tobacco use includes Snuff. He reports that he drinks about 4.2 oz of alcohol per week. He reports that he does not use illicit drugs.   Family History:  The patient's *   family history includes Diabetes in his other; Drug abuse in his brother; Heart attack in his mother; Hypertension in his mother; Sudden death in his father and mother. There is no history of Anemia, Arrhythmia, Asthma, Cancer, Depression, Heart failure, Hyperlipidemia, Kidney disease, Thyroid disease, Fainting, or Stroke.    ROS:  Please see the history of present illness and past medical history  Otherwise, all other systems were reviewed and were negative except      PHYSICAL EXAM: VS:  BP 126/74 mmHg  Pulse 77  Ht 5\' 6"  (1.676 m)  Wt 238 lb 9.6  oz (108.228 kg)  BMI 38.53 kg/m2 , BMI Body mass index is 38.53 kg/(m^2). GEN: Well nourished, well developed, in no acute distress HEENT: normal Neck:  JVD 8-10, carotid bruits, or masses Cardiac: REGULAR RATE and RHYTHM ; 2/6  murmurs, rubs, No S4  Back without kyphosis; No CVAT Respiratory:  clear to auscultation bilaterally, normal work of breathing GI: soft, nontender, nondistended, + BS MS: no deformity or atrophy Extremities no clubbing cyanosis  tr edema Skin: warm and dry,   device pocket is well healed without teathering Neuro:  Strength and sensation are intact Psych:  euthymic mood, full affect  EKG:  EKG is not ordered  Device interrogation is reviewed today in detail.  See PaceArt for details.   Recent Labs: 12/03/2013: ALT 29 06/13/2014: BUN 17; Creatinine 0.97; Hemoglobin 13.0; Platelets 193; Potassium 3.9; Sodium 137    Lipid Panel     Component Value Date/Time   CHOL 180 09/29/2013 0238   TRIG 382* 09/29/2013 0238   HDL 35* 09/29/2013 0238   CHOLHDL 5.1 09/29/2013 0238   VLDL 76* 09/29/2013 0238   LDLCALC 69 09/29/2013 0238     Wt Readings from Last 3 Encounters:  07/08/14 238 lb 9.6 oz (108.228 kg)  06/08/14 230 lb (104.327 kg)  04/07/14 240 lb 4.8 oz (109 kg)      Other studies Reviewed: Additional studies/ records that were reviewed today include: none    ASSESSMENT AND PLAN: Hypertrophic cardiomyopahy  HFpEF  Shoulder soreness  ICD Med[employer]onic The patient's device was interrogated and the information was fully reviewed.  The device was reprogrammed to  Maximize longevity  The patient is doing relatively well following ICD implantation. We will begin him on a diuretic resuming his Lasix      Current medicines are reviewed at length with the patient today.   The patient does not have concerns regarding his medicines.  The following changes were made today:  As above   Labs/ tests ordered today include:  none  No orders of the defined types were placed in this encounter.     Disposition:   FU with [facility] device clinic  9 month(s)  Signed, Sherryl MangesSteven Lucetta Baehr, MD  07/08/2014 2:53 PM     [facility] [address] [place] [address] [phone] (office) [phone] (fax)

## 2014-07-08 NOTE — Patient Instructions (Signed)
Medication Instructions:  Your physician recommends that you continue on your current medications as directed. Please refer to the Current Medication list given to you today.  Labwork: BMET in 4 weeks  Testing/Procedures: None ordered  Follow-Up: Your physician recommends that you schedule a follow-up appointment in: 3 months with Dr. Delton SeeNelson.  Remote monitoring is used to monitor your Pacemaker of ICD from home. This monitoring reduces the number of office visits required to check your device to one time per year. It allows us to keep an eye on the functioning of your device to ensure it is working properly. You are scheduled for a device check from home on 10/10/14. You may send your transmission at any time that day. If you have a wireless device, the transmission will be sent automatically. After your physician reviews your transmission, you will receive a postcard with your next transmission date.  Your physician wants you to follow-up in: 9 months with Gypsy BalsamAmber Seiler, NP.  You will receive a reminder letter in the mail two months in advance. If you don't receive a letter, please call our office to schedule the follow-up appointment.   Any Other Special Instructions Will Be Listed Below (If Applicable).

## 2014-07-22 ENCOUNTER — Telehealth: Payer: Self-pay | Admitting: *Deleted

## 2014-07-22 MED ORDER — FUROSEMIDE 20 MG PO TABS: 20.0000 mg | ORAL_TABLET | Freq: Every day | ORAL | Status: DC

## 2014-07-22 NOTE — Telephone Encounter (Signed)
Called patient (per instructions from WishekKlein) - informed him to start Lasix 20 mg daily.   Follow up BMET in 2 weeks and patient aware I will have [place] office call him, next week,  to arrange this lab work. Patient verbalized understanding and agreeable to plan.

## 2014-07-22 NOTE — Addendum Note (Signed)
Addended by: Baird LyonsPRICE, Carlos Quackenbush L on: 07/22/2014 04:13 PM   Modules accepted: Orders

## 2014-07-26 ENCOUNTER — Telehealth: Payer: Self-pay | Admitting: *Deleted

## 2014-07-26 NOTE — Telephone Encounter (Signed)
Called to let patient know [facility].  I left a detailed message on his cell phone.   Let him know he could call me if he had any questions or concerns.

## 2014-08-01 ENCOUNTER — Telehealth: Payer: Self-pay | Admitting: Internal Medicine

## 2014-08-01 NOTE — Telephone Encounter (Signed)
Called to let patient know that I have contacted [facility] for him and he should be hearing something shortly.  He stated verbal understanding

## 2014-08-01 NOTE — Telephone Encounter (Signed)
New message      Want Toma Copier to know CPAP machine has not been delivered as of today

## 2014-08-08 ENCOUNTER — Other Ambulatory Visit: Payer: Self-pay

## 2014-08-09 ENCOUNTER — Other Ambulatory Visit (INDEPENDENT_AMBULATORY_CARE_PROVIDER_SITE_OTHER): Payer: Self-pay | Admitting: *Deleted

## 2014-08-09 DIAGNOSIS — I472 Ventricular tachycardia: Secondary | ICD-10-CM

## 2014-08-09 DIAGNOSIS — I4729 Other ventricular tachycardia: Secondary | ICD-10-CM

## 2014-08-11 LAB — BASIC METABOLIC PANEL
BUN/Creatinine Ratio: 14 (ref 9–20)
BUN: 14 mg/dL (ref 6–24)
CO2: 19 mmol/L (ref 18–29)
Calcium: 9.1 mg/dL (ref 8.7–10.2)
Chloride: 101 mmol/L (ref 97–108)
Creatinine, Ser: 1.03 mg/dL (ref 0.76–1.27)
GFR calc Af Amer: 95 mL/min/{1.73_m2} (ref >59)
GFR calc non Af Amer: 82 mL/min/{1.73_m2} (ref >59)
GLUCOSE: 105 mg/dL — AB (ref 65–99)
POTASSIUM: 4.4 mmol/L (ref 3.5–5.2)
Sodium: 139 mmol/L (ref 134–144)

## 2014-08-12 ENCOUNTER — Encounter: Payer: Self-pay | Admitting: Medical Oncology

## 2014-08-12 ENCOUNTER — Emergency Department
Admission: EM | Admit: 2014-08-12 | Discharge: 2014-08-12 | Disposition: A | Discharge status: Home / Self Care | Payer: Medicaid Other | Attending: Emergency Medicine | Admitting: Emergency Medicine

## 2014-08-12 ENCOUNTER — Emergency Department: Payer: Medicaid Other

## 2014-08-12 DIAGNOSIS — R079 Chest pain, unspecified: Secondary | ICD-10-CM

## 2014-08-12 DIAGNOSIS — Z7982 Long term (current) use of aspirin: Secondary | ICD-10-CM | POA: Insufficient documentation

## 2014-08-12 DIAGNOSIS — Z79899 Other long term (current) drug therapy: Secondary | ICD-10-CM | POA: Diagnosis not present

## 2014-08-12 DIAGNOSIS — I10 Essential (primary) hypertension: Secondary | ICD-10-CM | POA: Insufficient documentation

## 2014-08-12 DIAGNOSIS — R0789 Other chest pain: Secondary | ICD-10-CM | POA: Insufficient documentation

## 2014-08-12 LAB — COMPREHENSIVE METABOLIC PANEL
ALT: 22 U/L (ref 17–63)
AST: 28 U/L (ref 15–41)
Albumin: 3.9 g/dL (ref 3.5–5.0)
Alkaline Phosphatase: 57 U/L (ref 38–126)
Anion gap: 9 (ref 5–15)
BUN: 11 mg/dL (ref 6–20)
CALCIUM: 8.9 mg/dL (ref 8.9–10.3)
CHLORIDE: 108 mmol/L (ref 101–111)
CO2: 23 mmol/L (ref 22–32)
Creatinine, Ser: 0.88 mg/dL (ref 0.61–1.24)
GFR calc Af Amer: >60 mL/min (ref >60)
GFR calc non Af Amer: >60 mL/min (ref >60)
Glucose, Bld: 119 mg/dL — ABNORMAL HIGH (ref 65–99)
POTASSIUM: 4 mmol/L (ref 3.5–5.1)
Sodium: 140 mmol/L (ref 135–145)
Total Bilirubin: 0.7 mg/dL (ref 0.3–1.2)
Total Protein: 7.1 g/dL (ref 6.5–8.1)

## 2014-08-12 LAB — CBC
HCT: 39.9 % — ABNORMAL LOW (ref 40.0–52.0)
Hemoglobin: 13.3 g/dL (ref 13.0–18.0)
MCH: 31.5 pg (ref 26.0–34.0)
MCHC: 33.3 g/dL (ref 32.0–36.0)
MCV: 94.6 fL (ref 80.0–100.0)
PLATELETS: 148 10*3/uL — AB (ref 150–440)
RBC: 4.22 MIL/uL — ABNORMAL LOW (ref 4.40–5.90)
RDW: 14.8 % — ABNORMAL HIGH (ref 11.5–14.5)
WBC: 4.7 10*3/uL (ref 3.8–10.6)

## 2014-08-12 LAB — TROPONIN I
TROPONIN I: 0.04 ng/mL — AB (ref <0.031)
Troponin I: <0.03 ng/mL (ref <0.031)

## 2014-08-12 MED ORDER — NITROGLYCERIN 0.4 MG SL SUBL
SUBLINGUAL_TABLET | SUBLINGUAL | Status: AC
Start: 2014-08-12 — End: 2014-08-12
  Administered 2014-08-12: 0.4 mg via SUBLINGUAL
  Filled 2014-08-12: qty 3

## 2014-08-12 MED ORDER — NITROGLYCERIN 0.4 MG SL SUBL
0.4000 mg | SUBLINGUAL_TABLET | Freq: Once | SUBLINGUAL | Status: AC
  Administered 2014-08-12 (×2): 0.4 mg via SUBLINGUAL

## 2014-08-12 NOTE — ED Notes (Signed)
Pt reports that he began using his c-pap machine 2 nights ago and since then has been having central chest pain. Denies sob.

## 2014-08-12 NOTE — ED Notes (Signed)
MD at bedside. 

## 2014-08-12 NOTE — Discharge Instructions (Signed)

## 2014-08-12 NOTE — ED Notes (Signed)
Patient transported to X-ray 

## 2014-08-12 NOTE — ED Provider Notes (Signed)
[facility] Emergency Department Provider Note  ____________________________________________  Time seen: On arrival  I have reviewed the triage vital signs and the nursing notes.   HISTORY  Chief Complaint Chest Pain     HPI Marc Bradford is a 54 y.o. male who presents with complaints of chest discomfort. He feels that this is related to CPAP machine which started using 2 nights ago because he wakes up the morning and feels moderate tightness in his chest. He feels better in the emergency department. He denies cough shortness of breath. No diaphoresis. No fevers chills. He denies history of heart attacks. He does have a defibrillator. He reports compliance with his medications and follows up with cardiology in [place].     Past Medical History  Diagnosis Date  . Gout   . Hyperlipidemia   . 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 s/p MDT dual chamber ICD implant 03/2014 followed by Dr Graciela Husbands  . Hypertension 09/30/2013  . OSA (obstructive sleep apnea)   . Stroke 09/22/2013    a. non-hemorrhagic b. residual left sided weakness    Patient Active Problem List   Diagnosis Date Noted  . (HFpEF) heart failure with preserved ejection fraction 04/06/2014  . 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 Essential hypertension 09/30/2013  . Hyperlipidemia 09/30/2013  . Left-sided weakness 09/28/2013    Past Surgical History  Procedure Laterality Date  . Implantable cardioverter defibrillator implant N/A 04/06/2014    MDT MRI compatible dual chamber ICD implanted by Dr Graciela Husbands    Current Outpatient Rx  Name  Route  Sig  Dispense  Refill  . aspirin EC 81 MG tablet   Oral   Take 81 mg by mouth daily.         . furosemide (LASIX) 20 MG tablet   Oral   Take 1 tablet (20 mg total) by mouth daily.   30 tablet   3   . HYDROcodone-acetaminophen (NORCO/VICODIN) 5-325 MG per tablet   Oral   Take 1 tablet by mouth every 6 (six) hours as needed  for moderate pain or severe pain.   10 tablet   0   . propranolol ER (INDERAL LA) 80 MG 24 hr capsule   Oral   Take 1 capsule (80 mg total) by mouth daily.   30 capsule   5   . quinapril (ACCUPRIL) 40 MG tablet   Oral   Take 40 mg by mouth daily.         . simvastatin (ZOCOR) 20 MG tablet   Oral   Take 20 mg by mouth at bedtime.           Allergies Review of patient's allergies indicates no known allergies.  Family History  Problem Relation Age of Onset  . Anemia Neg Hx   . Arrhythmia Neg Hx   . Asthma Neg Hx   . Cancer Neg Hx   . Depression Neg Hx   . Diabetes Other     family history  . Heart failure Neg Hx   . Hyperlipidemia Neg Hx   . Kidney disease Neg Hx   . Thyroid disease Neg Hx   . Fainting Neg Hx   . Stroke Neg Hx   . Heart attack Mother   . Sudden death Mother   . Hypertension Mother   . Drug abuse Brother   . Sudden death Father     Social History History  Substance Use Topics  . Smoking status: Never Smoker   .  Smokeless tobacco: Current User    Types: Snuff  . Alcohol Use: 4.2 oz/week    7 Cans of beer per week    Review of Systems  Constitutional: Negative for fever. Eyes: Negative for visual changes. ENT: Negative for sore throat Cardiovascular: Positive for chest tightness Respiratory: Negative for shortness of breath. Gastrointestinal: Negative for abdominal pain, vomiting and diarrhea. Genitourinary: Negative for dysuria. Musculoskeletal: Negative for back pain. Skin: Negative for rash. Neurological: Negative for headaches or focal weakness Psychiatric: No anxiety  10-point ROS otherwise negative.  ____________________________________________   PHYSICAL EXAM:  VITAL SIGNS: ED Triage Vitals  Enc Vitals Group     BP 08/12/14 1027 162/89 mmHg     Pulse Rate 08/12/14 1027 61     Resp 08/12/14 1027 20     Temp 08/12/14 1027 98.3 F (36.8 C)     Temp Source 08/12/14 1027 Oral     SpO2 08/12/14 1027 98 %     Weight  08/12/14 1027 230 lb (104.327 kg)     Height 08/12/14 1027  (1.676 m)     Head Cir --      Peak Flow --      Pain Score 08/12/14 1029 7     Pain Loc --      Pain Edu? --      Excl. in GC? --      Constitutional: Alert and oriented. Well appearing and in no distress. Pleasant Eyes: Conjunctivae are normal. No discharge ENT   Head: Normocephalic and atraumatic.   Mouth/Throat: Mucous membranes are moist. Cardiovascular: Normal rate, regular rhythm. Normal and symmetric distal pulses are present in all extremities. No murmurs, rubs, or gallops. Respiratory: Normal respiratory effort without tachypnea nor retractions. Breath sounds are clear and equal bilaterally.  Gastrointestinal: Soft and non-tender in all quadrants. No distention. There is no CVA tenderness. Genitourinary: deferred Musculoskeletal: Nontender with normal range of motion in all extremities. No lower extremity tenderness nor edema. Neurologic:  Normal speech and language. No gross focal neurologic deficits are appreciated. Skin:  Skin is warm, dry and intact. No rash noted. Psychiatric: Mood and affect are normal. Patient exhibits appropriate insight and judgment.  ____________________________________________    LABS (pertinent positives/negatives)  Labs Reviewed  CBC - Abnormal; Notable for the following:    RBC 4.22 (*)    HCT 39.9 (*)    RDW 14.8 (*)    Platelets 148 (*)    All other components within normal limits  COMPREHENSIVE METABOLIC PANEL - Abnormal; Notable for the following:    Glucose, Bld 119 (*)    All other components within normal limits  TROPONIN I - Abnormal; Notable for the following:    Troponin I 0.04 (*)    All other components within normal limits  TROPONIN I    ____________________________________________   EKG  ED ECG REPORT I, Jene Every, the attending physician, personally viewed and interpreted this ECG.   Date: 08/12/2014  EKG Time: 10:34 AM  Rate: 60   Rhythm: Electronic atrial pacemaker, LVH  Axis: Normal  Intervals:none  ST&T Change: Unchanged from prior  Compared to last EKG in the system, patient with chronically inverted T waves laterally and in II and aVF  ____________________________________________    RADIOLOGY  Chest x-ray reviewed by me, unremarkable  ____________________________________________   PROCEDURES  Procedure(s) performed: none  Critical Care performed: none  ____________________________________________   INITIAL IMPRESSION / ASSESSMENT AND PLAN / ED COURSE  Pertinent labs & imaging results  that were available during my care of the patient were reviewed by me and considered in my medical decision making (see chart for details).  In reviewing patient's medical records he appears to have chronic troponin 0.04 which is what it is today. He has no chest pain currently and reports he feels fine. I would prefer to admit him given his numerous risk factors however he prefers to go home. He does agree to a second troponin prior to leaving.  Second troponin negative  He understands that any return of chest pain requires a repeat visit to the emergency department. ____________________________________________   FINAL CLINICAL IMPRESSION(S) / ED DIAGNOSES  Final diagnoses:  Chest pain, unspecified chest pain type     Jene Every, MD 08/12/14 1504

## 2014-08-16 ENCOUNTER — Emergency Department
Admission: EM | Admit: 2014-08-16 | Discharge: 2014-08-16 | Disposition: A | Discharge status: Home / Self Care | Payer: Medicaid Other | Attending: Emergency Medicine | Admitting: Emergency Medicine

## 2014-08-16 ENCOUNTER — Encounter: Payer: Self-pay | Admitting: Emergency Medicine

## 2014-08-16 DIAGNOSIS — Y9389 Activity, other specified: Secondary | ICD-10-CM | POA: Diagnosis not present

## 2014-08-16 DIAGNOSIS — S0502XA Injury of conjunctiva and corneal abrasion without foreign body, left eye, initial encounter: Secondary | ICD-10-CM | POA: Diagnosis not present

## 2014-08-16 DIAGNOSIS — I10 Essential (primary) hypertension: Secondary | ICD-10-CM | POA: Insufficient documentation

## 2014-08-16 DIAGNOSIS — S0592XA Unspecified injury of left eye and orbit, initial encounter: Secondary | ICD-10-CM | POA: Diagnosis present

## 2014-08-16 DIAGNOSIS — Z7982 Long term (current) use of aspirin: Secondary | ICD-10-CM | POA: Insufficient documentation

## 2014-08-16 DIAGNOSIS — Y9289 Other specified places as the place of occurrence of the external cause: Secondary | ICD-10-CM | POA: Insufficient documentation

## 2014-08-16 DIAGNOSIS — X58XXXA Exposure to other specified factors, initial encounter: Secondary | ICD-10-CM | POA: Insufficient documentation

## 2014-08-16 DIAGNOSIS — Z79899 Other long term (current) drug therapy: Secondary | ICD-10-CM | POA: Insufficient documentation

## 2014-08-16 DIAGNOSIS — Y998 Other external cause status: Secondary | ICD-10-CM | POA: Diagnosis not present

## 2014-08-16 DIAGNOSIS — E785 Hyperlipidemia, unspecified: Secondary | ICD-10-CM | POA: Diagnosis not present

## 2014-08-16 MED ORDER — GENTAMICIN SULFATE 0.3 % OP SOLN: 1.0000 [drp] | Freq: Three times a day (TID) | OPHTHALMIC | Status: AC

## 2014-08-16 MED ORDER — TETRACAINE HCL 0.5 % OP SOLN
OPHTHALMIC | Status: AC
  Filled 2014-08-16: qty 2

## 2014-08-16 MED ORDER — HYDROXYZINE HCL 50 MG PO TABS: 50.0000 mg | ORAL_TABLET | Freq: Three times a day (TID) | ORAL | Status: DC | PRN

## 2014-08-16 MED ORDER — FLUORESCEIN SODIUM 1 MG OP STRP
ORAL_STRIP | OPHTHALMIC | Status: AC
  Filled 2014-08-16: qty 1

## 2014-08-16 MED ORDER — EYE WASH OPHTH SOLN
OPHTHALMIC | Status: AC
  Filled 2014-08-16: qty 118

## 2014-08-16 NOTE — ED Notes (Signed)
Left Eye irritation since last night , no known injury , swelling noted , itching

## 2014-08-16 NOTE — ED Provider Notes (Signed)
[facility] Emergency Department Provider Note  ____________________________________________  Time seen: Approximately 10:04 AM  I have reviewed the triage vital signs and the nursing notes.   HISTORY  Chief Complaint Eye Pain    HPI Marc Bradford is a 54 y.o. male complaining of left eye irritation and swelling on the side. Patient stated this feel like there is something in his eye. Onset was last night. Patient denies any vision loss. Patient denies any pain. No palliative measures taken before arrival.   Past Medical History  Diagnosis Date  . Gout   . Hyperlipidemia   . 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 s/p MDT dual chamber ICD implant 03/2014 followed by Dr Graciela Husbands  . Hypertension 09/30/2013  . OSA (obstructive sleep apnea)   . Stroke 09/22/2013    a. non-hemorrhagic b. residual left sided weakness    Patient Active Problem List   Diagnosis Date Noted  . (HFpEF) heart failure with preserved ejection fraction 04/06/2014  . 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 Essential hypertension 09/30/2013  . Hyperlipidemia 09/30/2013  . Left-sided weakness 09/28/2013    Past Surgical History  Procedure Laterality Date  . Implantable cardioverter defibrillator implant N/A 04/06/2014    MDT MRI compatible dual chamber ICD implanted by Dr Graciela Husbands    Current Outpatient Rx  Name  Route  Sig  Dispense  Refill  . aspirin EC 81 MG tablet   Oral   Take 81 mg by mouth daily.         . furosemide (LASIX) 20 MG tablet   Oral   Take 1 tablet (20 mg total) by mouth daily.   30 tablet   3   . gentamicin (GARAMYCIN) 0.3 % ophthalmic solution   Left Eye   Place 1 drop into the left eye 3 (three) times daily.   5 mL   0   . HYDROcodone-acetaminophen (NORCO/VICODIN) 5-325 MG per tablet   Oral   Take 1 tablet by mouth every 6 (six) hours as needed for moderate pain or severe pain.   10 tablet   0   . hydrOXYzine (ATARAX/VISTARIL)  50 MG tablet   Oral   Take 1 tablet (50 mg total) by mouth 3 (three) times daily as needed.   30 tablet   0   . propranolol ER (INDERAL LA) 80 MG 24 hr capsule   Oral   Take 1 capsule (80 mg total) by mouth daily.   30 capsule   5   . quinapril (ACCUPRIL) 40 MG tablet   Oral   Take 40 mg by mouth daily.         . simvastatin (ZOCOR) 20 MG tablet   Oral   Take 20 mg by mouth at bedtime.           Allergies Review of patient's allergies indicates no known allergies.  Family History  Problem Relation Age of Onset  . Anemia Neg Hx   . Arrhythmia Neg Hx   . Asthma Neg Hx   . Cancer Neg Hx   . Depression Neg Hx   . Diabetes Other     family history  . Heart failure Neg Hx   . Hyperlipidemia Neg Hx   . Kidney disease Neg Hx   . Thyroid disease Neg Hx   . Fainting Neg Hx   . Stroke Neg Hx   . Heart attack Mother   . Sudden death Mother   . Hypertension Mother   .  Drug abuse Brother   . Sudden death Father     Social History History  Substance Use Topics  . Smoking status: Never Smoker   . Smokeless tobacco: Current User    Types: Snuff  . Alcohol Use: 1.2 oz/week    2 Cans of beer per week    Review of Systems Constitutional: No fever/chills Eyes: No visual changes. Foreign body sensation itching to the left eye with swelling of the inferior orbital area, ENT: No sore throat. Cardiovascular: Denies chest pain. Respiratory: Denies shortness of breath. Gastrointestinal: No abdominal pain.  No nausea, no vomiting.  No diarrhea.  No constipation. Genitourinary: Negative for dysuria. Musculoskeletal: Negative for back pain. Skin: Negative for rash. Neurological: Negative for headaches, focal weakness or numbness. Endocrine:Hypertension and hyperlipidemia. Hematological/Lymphatic: 10-point ROS otherwise negative.  ____________________________________________   PHYSICAL EXAM:  VITAL SIGNS: ED Triage Vitals  Enc Vitals Group     BP 08/16/14 0917  152/90 mmHg     Pulse Rate 08/16/14 0917 59     Resp 08/16/14 0917 18     Temp 08/16/14 0917 97.8 F (36.6 C)     Temp Source 08/16/14 0917 Oral     SpO2 08/16/14 0917 98 %     Weight 08/16/14 0917 230 lb (104.327 kg)     Height 08/16/14 0917  (1.676 m)     Head Cir --      Peak Flow --      Pain Score 08/16/14 0917 0     Pain Loc --      Pain Edu? --      Excl. in GC? --    Constitutional: Alert and oriented. Well appearing and in no acute distress. Eyes:. PERRL. EOMI. see vision acuity charted by Nurse. Conjunctiva is injected no visible foreign body. However, or stain uptake revealed cornea abrasion inferior aspect of the eye. Head: Atraumatic. Nose: No congestion/rhinnorhea. Mouth/Throat: Mucous membranes are moist.  Oropharynx non-erythematous. Neck: No stridor. No cervical spine tenderness to palpation Hematological/Lymphatic/Immunilogical: No cervical lymphadenopathy. Cardiovascular: Normal rate, regular rhythm. Grossly normal heart sounds.  Good peripheral circulation. Elevated BP Respiratory: Normal respiratory effort.  No retractions. Lungs CTAB. Gastrointestinal: Soft and nontender. No distention. No abdominal bruits. No CVA tenderness. Musculoskeletal: No lower extremity tenderness nor edema.  No joint effusions. Neurologic:  Normal speech and language. No gross focal neurologic deficits are appreciated. Speech is normal. No gait instability. Skin:  Skin is warm, dry and intact. No rash noted. Psychiatric: Mood and affect are normal. Speech and behavior are normal.  ____________________________________________   LABS (all labs ordered are listed, but only abnormal results are displayed)  Labs Reviewed - No data to display ____________________________________________  EKG   ____________________________________________  RADIOLOGY   ____________________________________________   PROCEDURES  Procedure(s) performed: See procedure note  Critical Care  performed: No  ______ 10 patient consent to instill 3 drops of tetracaine ophthalmic drops into the left eye. Fluoroscopy stain ___reveals a 2 mm cornea abrasion inferior aspect of the pupil. Left eye was irrigated with 20 mL of normal saline. Patient tolerated procedure well. INITIAL IMPRESSION / ASSESSMENT AND PLAN / ED COURSE  Pertinent labs & imaging results that were available during my care of the patient were reviewed by me and considered in my medical decision making (see chart for details).  Left corneal abrasion. Patient will be discharged prescriptions for gentamicin ophthalmic eyedrops to use as directed. Patient also given a prescription for Atarax for the itching and swelling. Patient  advised to follow[facility] if no improvement or worsening of his complaint in 2-3 days. ____________________________________________   FINAL CLINICAL IMPRESSION(S) / ED DIAGNOSES  Final diagnoses:  Left corneal abrasion, initial encounter       Joni ReiningRonald K Smith, PA-C 08/16/14 1026  Darien Ramusavid W Kaminski, MD 08/16/14 1530

## 2014-08-16 NOTE — ED Notes (Signed)
swelling under his left eye

## 2014-08-16 NOTE — Discharge Instructions (Signed)
Corneal Abrasion °The cornea is the clear covering at the front and center of the eye. When looking at the colored portion of the eye (iris), you are looking through the cornea. This very thin tissue is made up of many layers. The surface layer is a single layer of cells (corneal epithelium) and is one of the most sensitive tissues in the body. If a scratch or injury causes the corneal epithelium to come off, it is called a corneal abrasion. If the injury extends to the tissues below the epithelium, the condition is called a corneal ulcer. °CAUSES  °· Scratches. °· Trauma. °· Foreign body in the eye. °Some people have recurrences of abrasions in the area of the original injury even after it has healed (recurrent erosion syndrome). Recurrent erosion syndrome generally improves and goes away with time. °SYMPTOMS  °· Eye pain. °· Difficulty or inability to keep the injured eye open. °· The eye becomes very sensitive to light. °· Recurrent erosions tend to happen suddenly, first thing in the morning, usually after waking up and opening the eye. °DIAGNOSIS  °Your health care provider can diagnose a corneal abrasion during an eye exam. Dye is usually placed in the eye using a drop or a small paper strip moistened by your tears. When the eye is examined with a special light, the abrasion shows up clearly because of the dye. °TREATMENT  °· Small abrasions may be treated with antibiotic drops or ointment alone. °· A pressure patch may be put over the eye. If this is done, follow your doctor's instructions for when to remove the patch. Do not drive or use machines while the eye patch is on. Judging distances is hard to do with a patch on. °If the abrasion becomes infected and spreads to the deeper tissues of the cornea, a corneal ulcer can result. This is serious because it can cause corneal scarring. Corneal scars interfere with light passing through the cornea and cause a loss of vision in the involved eye. °HOME CARE  INSTRUCTIONS °· Use medicine or ointment as directed. Only take over-the-counter or prescription medicines for pain, discomfort, or fever as directed by your health care provider. °· Do not drive or operate machinery if your eye is patched. Your ability to judge distances is impaired. °· If your health care provider has given you a follow-up appointment, it is very important to keep that appointment. Not keeping the appointment could result in a severe eye infection or permanent loss of vision. If there is any problem keeping the appointment, let your health care provider know. °SEEK MEDICAL CARE IF:  °· You have pain, light sensitivity, and a scratchy feeling in one eye or both eyes. °· Your pressure patch keeps loosening up, and you can blink your eye under the patch after treatment. °· Any kind of discharge develops from the eye after treatment or if the lids stick together in the morning. °· You have the same symptoms in the morning as you did with the original abrasion days, weeks, or months after the abrasion healed. °MAKE SURE YOU:  °· Understand these instructions. °· Will watch your condition. °· Will get help right away if you are not doing well or get worse. °Document Released: 02/02/2000 Document Revised: 02/09/2013 Document Reviewed: 10/12/2012 °ExitCare® Patient Information ©2015 [employer]. This information is not intended to replace advice given to you by your health care provider. Make sure you discuss any questions you have with your health care provider. ° °

## 2014-08-29 ENCOUNTER — Ambulatory Visit: Payer: Self-pay | Admitting: Cardiology

## 2014-09-22 ENCOUNTER — Ambulatory Visit (INDEPENDENT_AMBULATORY_CARE_PROVIDER_SITE_OTHER): Payer: Self-pay | Admitting: Cardiology

## 2014-09-22 DIAGNOSIS — I422 Other hypertrophic cardiomyopathy: Secondary | ICD-10-CM

## 2014-09-22 NOTE — Progress Notes (Signed)
No show

## 2014-10-10 ENCOUNTER — Ambulatory Visit (INDEPENDENT_AMBULATORY_CARE_PROVIDER_SITE_OTHER): Payer: Medicaid Other | Admitting: *Deleted

## 2014-10-10 DIAGNOSIS — I422 Other hypertrophic cardiomyopathy: Secondary | ICD-10-CM | POA: Diagnosis not present

## 2014-10-10 NOTE — Progress Notes (Signed)
Remote ICD transmission.   

## 2014-10-14 LAB — CUP PACEART REMOTE DEVICE CHECK
Brady Statistic AP VP Percent: 0.03 %
Brady Statistic RV Percent Paced: 0.05 %
HIGH POWER IMPEDANCE MEASURED VALUE: 59 Ohm
Lead Channel Impedance Value: 361 Ohm
Lead Channel Impedance Value: 418 Ohm
Lead Channel Impedance Value: 475 Ohm
Lead Channel Pacing Threshold Amplitude: 0.625 V
Lead Channel Pacing Threshold Pulse Width: 0.4 ms
Lead Channel Sensing Intrinsic Amplitude: 3.125 mV
Lead Channel Sensing Intrinsic Amplitude: 31.625 mV
Lead Channel Sensing Intrinsic Amplitude: 31.625 mV
Lead Channel Setting Pacing Amplitude: 1.5 V
Lead Channel Setting Pacing Amplitude: 2.5 V
Lead Channel Setting Pacing Pulse Width: 0.4 ms
Lead Channel Setting Sensing Sensitivity: 0.3 mV
MDC IDC MSMT BATTERY REMAINING LONGEVITY: 126 mo
MDC IDC MSMT BATTERY VOLTAGE: 3.07 V
MDC IDC MSMT LEADCHNL RA PACING THRESHOLD AMPLITUDE: 0.625 V
MDC IDC MSMT LEADCHNL RA PACING THRESHOLD PULSEWIDTH: 0.4 ms
MDC IDC MSMT LEADCHNL RA SENSING INTR AMPL: 3.125 mV
MDC IDC SESS DTM: 20160822073326
MDC IDC STAT BRADY AP VS PERCENT: 26.7 %
MDC IDC STAT BRADY AS VP PERCENT: 0.02 %
MDC IDC STAT BRADY AS VS PERCENT: 73.24 %
MDC IDC STAT BRADY RA PERCENT PACED: 26.74 %
Zone Setting Detection Interval: 250 ms
Zone Setting Detection Interval: 300 ms
Zone Setting Detection Interval: 350 ms
Zone Setting Detection Interval: 450 ms

## 2014-10-28 ENCOUNTER — Encounter: Payer: Self-pay | Admitting: Cardiology

## 2014-10-28 DIAGNOSIS — G4733 Obstructive sleep apnea (adult) (pediatric): Secondary | ICD-10-CM | POA: Insufficient documentation

## 2014-10-28 HISTORY — DX: Obstructive sleep apnea (adult) (pediatric): G47.33

## 2014-10-28 NOTE — Progress Notes (Signed)
This encounter was created in error - please disregard.

## 2014-11-03 ENCOUNTER — Encounter: Payer: Self-pay | Admitting: Internal Medicine

## 2014-12-05 NOTE — Progress Notes (Signed)
Cardiology Office Note   Date:  12/06/2014   ID:  Marc Bradford, Marc Bradford 10-26-1960, MRN [id number]  PCP:  Marc Masson, MD    Chief Complaint  Patient presents with  . Sleep Apnea  . Hypertension      History of Present Illness: Marc Bradford is a 54 y.o. male who presents for evaluation of OSA.  He was referred for PSG by Dr. Graciela Husbands due to excessive daytime sleepiness, witnessed apnea and snoring and was found to have severe OSA with an AHI of 77.8/hr.  Most events occurred in nonsupine sleep and in NREM sleep.  Oxygen saturations dropped to 80% with respiratory events.  He underwent successful CPAP titration to 11cm H2O.  He is doing well with his CPAP.  He tolerates the nasal mask and feels the pressure is adequate for the most part.  Since starting CPAP, he feels more rested in the am and daytime sleepiness has improved. His snoring has resolved.   He has no mouth dryness or nasal congestion.       Past Medical History  Diagnosis Date  . Gout   . Hyperlipidemia   . Hypertrophic cardiomyopathy (HCC) 09/30/2013    a. s/p MDT dual chamber ICD implant 03/2014 followed by Dr Graciela Husbands  . Hypertension 09/30/2013  . OSA (obstructive sleep apnea)   . Stroke (HCC) 09/22/2013    a. non-hemorrhagic b. residual left sided weakness  . OSA (obstructive sleep apnea) 10/28/2014    Severe with AHI 78/hr now on CPAP    Past Surgical History  Procedure Laterality Date  . Implantable cardioverter defibrillator implant N/A 04/06/2014    MDT MRI compatible dual chamber ICD implanted by Dr Graciela Husbands     Current Outpatient Prescriptions  Medication Sig Dispense Refill  . aspirin EC 81 MG tablet Take 81 mg by mouth daily.    . furosemide (LASIX) 20 MG tablet Take 1 tablet (20 mg total) by mouth daily. 30 tablet 3  . HYDROcodone-acetaminophen (NORCO/VICODIN) 5-325 MG per tablet Take 1 tablet by mouth every 6 (six) hours as needed for moderate pain or severe pain. 10 tablet 0  . hydrOXYzine  (ATARAX/VISTARIL) 50 MG tablet Take 1 tablet (50 mg total) by mouth 3 (three) times daily as needed. 30 tablet 0  . propranolol ER (INDERAL LA) 80 MG 24 hr capsule Take 1 capsule (80 mg total) by mouth daily. 30 capsule 5  . quinapril (ACCUPRIL) 40 MG tablet Take 40 mg by mouth daily.    . simvastatin (ZOCOR) 20 MG tablet Take 20 mg by mouth at bedtime.     No current facility-administered medications for this visit.    Allergies:   Review of patient's allergies indicates no known allergies.    Social History:  The patient  reports that he has never smoked. His smokeless tobacco use includes Snuff. He reports that he drinks about 1.2 oz of alcohol per week. He reports that he does not use illicit drugs.   Family History:  The patient's family history includes Diabetes in his other; Drug abuse in his brother; Heart attack in his mother; Hypertension in his mother; Sudden death in his father and mother. There is no history of Anemia, Arrhythmia, Asthma, Cancer, Depression, Heart failure, Hyperlipidemia, Kidney disease, Thyroid disease, Fainting, or Stroke.    ROS:  Please see the history of present illness.   Otherwise, review of systems are positive  for none.   All other systems are reviewed and negative.    PHYSICAL EXAM: VS:  BP 154/86 mmHg  Pulse 61  Ht 5\' 6"  (1.676 m)  Wt 238 lb 1.9 oz (108.011 kg)  BMI 38.45 kg/m2  SpO2 96% , BMI Body mass index is 38.45 kg/(m^2). GEN: Well nourished, well developed, in no acute distress HEENT: normal Neck: no JVD, carotid bruits, or masses Cardiac: RRR; no murmurs, rubs, or gallops,no edema  Respiratory:  clear to auscultation bilaterally, normal work of breathing GI: soft, nontender, nondistended, + BS MS: no deformity or atrophy Skin: warm and dry, no rash Neuro:  Strength and sensation are intact Psych: euthymic mood, full affect   EKG:  EKG is not ordered today.    Recent Labs: 08/12/2014: ALT 22; BUN 11; Creatinine, Ser 0.88;  Hemoglobin 13.3; Platelets 148*; Potassium 4.0; Sodium 140    Lipid Panel    Component Value Date/Time   CHOL 180 09/29/2013 0238   CHOL 120 05/24/2011 0444   TRIG 382* 09/29/2013 0238   TRIG 288* 05/24/2011 0444   HDL 35* 09/29/2013 0238   HDL 28* 05/24/2011 0444   CHOLHDL 5.1 09/29/2013 0238   VLDL 76* 09/29/2013 0238   VLDL 58* 05/24/2011 0444   LDLCALC 69 09/29/2013 0238   LDLCALC 34 05/24/2011 0444      Wt Readings from Last 3 Encounters:  12/06/14 238 lb 1.9 oz (108.011 kg)  08/16/14 230 lb (104.327 kg)  08/12/14 230 lb (104.327 kg)       ASSESSMENT AND PLAN:  1.  Severe OSA with AHI 77.8/hr now on CPAP at 11cm H2O and tolerating well.  Patient has been using and benefiting from CPAP use and will continue to benefit from therapy.   I have also instructed the patient on proper sleep hygiene, avoidance of sleeping in the supine position and avoidance of alcohol within 4 hours of bedtime.  The patient was also instructed to avoid driving if sleepy.  I will get a d/l from the DME. 2.  Obesity - he walks 2 miles daily 3.  HTN - borderlinecontrolled on ACE I and BB   Current medicines are reviewed at length with the patient today.  The patient does not have concerns regarding medicines.  The following changes have been made:  no change  Labs/ tests ordered today: See above Assessment and Plan No orders of the defined types were placed in this encounter.     Disposition:   FU with me in 1 year  Signed, Quintella ReichertURNER,Dyanna Seiter R, MD  12/06/2014 8:59 AM    [facility] [address], [place], [place]  [address] Phone: [phone]; Fax: [phone]

## 2014-12-06 ENCOUNTER — Ambulatory Visit (INDEPENDENT_AMBULATORY_CARE_PROVIDER_SITE_OTHER): Payer: Medicaid Other | Admitting: Cardiology

## 2014-12-06 ENCOUNTER — Encounter: Payer: Self-pay | Admitting: Cardiology

## 2014-12-06 VITALS — BP 154/86 | HR 61 | Ht 66.0 in | Wt 238.1 lb

## 2014-12-06 DIAGNOSIS — G4733 Obstructive sleep apnea (adult) (pediatric): Secondary | ICD-10-CM

## 2014-12-06 DIAGNOSIS — I10 Essential (primary) hypertension: Secondary | ICD-10-CM

## 2014-12-06 NOTE — Patient Instructions (Signed)

## 2015-01-11 ENCOUNTER — Ambulatory Visit (INDEPENDENT_AMBULATORY_CARE_PROVIDER_SITE_OTHER): Payer: Self-pay | Admitting: *Deleted

## 2015-01-11 DIAGNOSIS — I422 Other hypertrophic cardiomyopathy: Secondary | ICD-10-CM

## 2015-01-11 NOTE — Progress Notes (Signed)
Remote ICD transmission.   

## 2015-01-23 LAB — CUP PACEART REMOTE DEVICE CHECK
Battery Remaining Longevity: 124 mo
Battery Voltage: 3.04 V
Brady Statistic AP VP Percent: 0.06 %
Brady Statistic AP VS Percent: 35.29 %
Brady Statistic AS VS Percent: 64.62 %
Date Time Interrogation Session: 20161123051704
HighPow Impedance: 61 Ohm
Implantable Lead Implant Date: 20160217
Implantable Lead Implant Date: 20160217
Implantable Lead Location: 753859
Implantable Lead Model: 5076
Lead Channel Impedance Value: 475 Ohm
Lead Channel Pacing Threshold Amplitude: 0.625 V
Lead Channel Pacing Threshold Amplitude: 0.75 V
Lead Channel Pacing Threshold Pulse Width: 0.4 ms
Lead Channel Pacing Threshold Pulse Width: 0.4 ms
Lead Channel Sensing Intrinsic Amplitude: 3.625 mV
Lead Channel Sensing Intrinsic Amplitude: 3.625 mV
Lead Channel Setting Pacing Amplitude: 2.5 V
Lead Channel Setting Pacing Pulse Width: 0.4 ms
MDC IDC LEAD LOCATION: 753860
MDC IDC MSMT LEADCHNL RV IMPEDANCE VALUE: 342 Ohm
MDC IDC MSMT LEADCHNL RV IMPEDANCE VALUE: 418 Ohm
MDC IDC MSMT LEADCHNL RV SENSING INTR AMPL: 31.625 mV
MDC IDC MSMT LEADCHNL RV SENSING INTR AMPL: 31.625 mV
MDC IDC SET LEADCHNL RA PACING AMPLITUDE: 1.5 V
MDC IDC SET LEADCHNL RV SENSING SENSITIVITY: 0.3 mV
MDC IDC STAT BRADY AS VP PERCENT: 0.02 %
MDC IDC STAT BRADY RA PERCENT PACED: 35.35 %
MDC IDC STAT BRADY RV PERCENT PACED: 0.08 %

## 2015-01-24 ENCOUNTER — Encounter: Payer: Self-pay | Admitting: Cardiology

## 2015-02-23 ENCOUNTER — Emergency Department
Admission: EM | Admit: 2015-02-23 | Discharge: 2015-02-23 | Disposition: A | Discharge status: Home / Self Care | Payer: Self-pay | Attending: Emergency Medicine | Admitting: Emergency Medicine

## 2015-02-23 ENCOUNTER — Encounter: Payer: Self-pay | Admitting: Medical Oncology

## 2015-02-23 DIAGNOSIS — Z7982 Long term (current) use of aspirin: Secondary | ICD-10-CM | POA: Insufficient documentation

## 2015-02-23 DIAGNOSIS — B349 Viral infection, unspecified: Secondary | ICD-10-CM | POA: Insufficient documentation

## 2015-02-23 DIAGNOSIS — Z79899 Other long term (current) drug therapy: Secondary | ICD-10-CM | POA: Insufficient documentation

## 2015-02-23 DIAGNOSIS — H6123 Impacted cerumen, bilateral: Secondary | ICD-10-CM | POA: Insufficient documentation

## 2015-02-23 DIAGNOSIS — I10 Essential (primary) hypertension: Secondary | ICD-10-CM | POA: Insufficient documentation

## 2015-02-23 LAB — RAPID INFLUENZA A&B ANTIGENS (ARMC ONLY)
INFLUENZA A (ARMC): NEGATIVE
INFLUENZA B (ARMC): NEGATIVE

## 2015-02-23 MED ORDER — ACETAMINOPHEN 325 MG PO TABS
650.0000 mg | ORAL_TABLET | Freq: Once | ORAL | Status: AC | PRN
  Administered 2015-02-23: 650 mg via ORAL

## 2015-02-23 MED ORDER — ACETAMINOPHEN 325 MG PO TABS
ORAL_TABLET | ORAL | Status: AC
  Filled 2015-02-23: qty 2

## 2015-02-23 MED ORDER — AMOXICILLIN 500 MG PO TABS: 500.0000 mg | ORAL_TABLET | Freq: Three times a day (TID) | ORAL | Status: DC

## 2015-02-23 MED ORDER — HYDROCODONE-HOMATROPINE 5-1.5 MG/5ML PO SYRP: 5.0000 mL | ORAL_SOLUTION | Freq: Four times a day (QID) | ORAL | Status: DC | PRN

## 2015-02-23 NOTE — Discharge Instructions (Signed)
Viral Infections A viral infection can be caused by different types of viruses.Most viral infections are not serious and resolve on their own. However, some infections may cause severe symptoms and may lead to further complications. SYMPTOMS Viruses can frequently cause:  Minor sore throat.  Aches and pains.  Headaches.  Runny nose.  Different types of rashes.  Watery eyes.  Tiredness.  Cough.  Loss of appetite.  Gastrointestinal infections, resulting in nausea, vomiting, and diarrhea. These symptoms do not respond to antibiotics because the infection is not caused by bacteria. However, you might catch a bacterial infection following the viral infection. This is sometimes called a "superinfection." Symptoms of such a bacterial infection may include:  Worsening sore throat with pus and difficulty swallowing.  Swollen neck glands.  Chills and a high or persistent fever.  Severe headache.  Tenderness over the sinuses.  Persistent overall ill feeling (malaise), muscle aches, and tiredness (fatigue).  Persistent cough.  Yellow, green, or brown mucus production with coughing. HOME CARE INSTRUCTIONS   Only take over-the-counter or prescription medicines for pain, discomfort, diarrhea, or fever as directed by your caregiver.  Drink enough water and fluids to keep your urine clear or pale yellow. Sports drinks can provide valuable electrolytes, sugars, and hydration.  Get plenty of rest and maintain proper nutrition. Soups and broths with crackers or rice are fine. SEEK IMMEDIATE MEDICAL CARE IF:   You have severe headaches, shortness of breath, chest pain, neck pain, or an unusual rash.  You have uncontrolled vomiting, diarrhea, or you are unable to keep down fluids.  You or your child has an oral temperature above 102 F (38.9 C), not controlled by medicine.  Your baby is older than 3 months with a rectal temperature of 102 F (38.9 C) or higher.  Your baby is 723  months old or younger with a rectal temperature of 100.4 F (38 C) or higher. MAKE SURE YOU:   Understand these instructions.  Will watch your condition.  Will get help right away if you are not doing well or get worse.   This information is not intended to replace advice given to you by your health care provider. Make sure you discuss any questions you have with your health care provider.   Document Released: 11/14/2004 Document Revised: 04/29/2011 Document Reviewed: 07/13/2014 Elsevier Interactive Patient Education [employer]2016 [employer]   I suspect you have a viral infection that usually will run its course of 3-7 days. Antibiotics do not kill viruses, but if symptoms worsen they may provide a preventative benefit. He may take over-the-counter Mucinex or Robitussin. Use cough medicine as needed. Follow-up with your physician or return to the emergency room if not Improving.

## 2015-02-23 NOTE — ED Provider Notes (Signed)
[facility] Emergency Department Provider Note  ____________________________________________  Time seen: Approximately 5:44 PM  I have reviewed the triage vital signs and the nursing notes.   HISTORY  Chief Complaint Generalized Body Aches and Fever    HPI Marc Bradford is a 55 y.o. male with history of hypertension, cardiomyopathy and defibrillatorwho presents with 2 days of fevers, myalgias, sore throat, cough and congestion. No shortness of breath or wheezing. He has general body aches. No clear exposure to the flu. No abdominal pain. No chest pain.   Past Medical History  Diagnosis Date  . Gout   . Hyperlipidemia   . Hypertrophic cardiomyopathy (HCC) 09/30/2013    a. s/p MDT dual chamber ICD implant 03/2014 followed by Dr Graciela Husbands  . Hypertension 09/30/2013  . OSA (obstructive sleep apnea)   . Stroke (HCC) 09/22/2013    a. non-hemorrhagic b. residual left sided weakness  . OSA (obstructive sleep apnea) 10/28/2014    Severe with AHI 78/hr now on CPAP    Patient Active Problem List   Diagnosis Date Noted  . OSA (obstructive sleep apnea) 10/28/2014  . Morbid obesity (HCC) 10/28/2014  . (HFpEF) heart failure with preserved ejection fraction (HCC) 04/06/2014  . 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 Essential hypertension 09/30/2013  . Hyperlipidemia 09/30/2013  . Left-sided weakness 09/28/2013    Past Surgical History  Procedure Laterality Date  . Implantable cardioverter defibrillator implant N/A 04/06/2014    MDT MRI compatible dual chamber ICD implanted by Dr Graciela Husbands    Current Outpatient Rx  Name  Route  Sig  Dispense  Refill  . amoxicillin (AMOXIL) 500 MG tablet   Oral   Take 1 tablet (500 mg total) by mouth 3 (three) times daily.   30 tablet   0   . aspirin EC 81 MG tablet   Oral   Take 81 mg by mouth daily.         . furosemide (LASIX) 20 MG tablet   Oral   Take 1 tablet (20 mg total) by mouth daily.   30  tablet   3   . HYDROcodone-acetaminophen (NORCO/VICODIN) 5-325 MG per tablet   Oral   Take 1 tablet by mouth every 6 (six) hours as needed for moderate pain or severe pain.   10 tablet   0   . HYDROcodone-homatropine (HYCODAN) 5-1.5 MG/5ML syrup   Oral   Take 5 mLs by mouth every 6 (six) hours as needed for cough.   120 mL   0   . hydrOXYzine (ATARAX/VISTARIL) 50 MG tablet   Oral   Take 1 tablet (50 mg total) by mouth 3 (three) times daily as needed.   30 tablet   0   . propranolol ER (INDERAL LA) 80 MG 24 hr capsule   Oral   Take 1 capsule (80 mg total) by mouth daily.   30 capsule   5   . quinapril (ACCUPRIL) 40 MG tablet   Oral   Take 40 mg by mouth daily.         . simvastatin (ZOCOR) 20 MG tablet   Oral   Take 20 mg by mouth at bedtime.           Allergies Review of patient's allergies indicates no known allergies.  Family History  Problem Relation Age of Onset  . Anemia Neg Hx   . Arrhythmia Neg Hx   . Asthma Neg Hx   . Cancer Neg Hx   .  Depression Neg Hx   . Diabetes Other     family history  . Heart failure Neg Hx   . Hyperlipidemia Neg Hx   . Kidney disease Neg Hx   . Thyroid disease Neg Hx   . Fainting Neg Hx   . Stroke Neg Hx   . Heart attack Mother   . Sudden death Mother   . Hypertension Mother   . Drug abuse Brother   . Sudden death Father     Social History Social History  Substance Use Topics  . Smoking status: Never Smoker   . Smokeless tobacco: Current User    Types: Snuff  . Alcohol Use: 1.2 oz/week    2 Cans of beer per week    Review of Systems Constitutional: positive fever/chills Eyes: No visual changes. ENT: sore throat, no ear pain. Cardiovascular: Denies chest pain. Respiratory: Denies shortness of breath. Gastrointestinal: No abdominal pain.  No nausea, no vomiting.  No diarrhea.  No constipation. Genitourinary: Negative for dysuria. Musculoskeletal:  Per HPI. Skin: Negative for rash. Neurological:  Negative for focal weakness or numbness. 10-point ROS otherwise negative.  ____________________________________________   PHYSICAL EXAM:  VITAL SIGNS: ED Triage Vitals  Enc Vitals Group     BP 02/23/15 1648 136/95 mmHg     Pulse Rate 02/23/15 1648 74     Resp 02/23/15 1648 20     Temp 02/23/15 1648 101.2 F (38.4 C)     Temp Source 02/23/15 1648 Oral     SpO2 02/23/15 1648 96 %     Weight 02/23/15 1648 230 lb (104.327 kg)     Height 02/23/15 1648 5\' 6"  (1.676 m)     Head Cir --      Peak Flow --      Pain Score 02/23/15 1649 10     Pain Loc --      Pain Edu? --      Excl. in GC? --     Constitutional: Alert and oriented. Well appearing and in no acute distress. Eyes: Conjunctivae are normal. PERRL. EOMI. Ears:  Wax bilateral. Head: Atraumatic. Nose: No congestion/rhinnorhea. Mouth/Throat: Mucous membranes are moist.  Oropharynx non-erythematous. No lesions. Neck:  Supple.  No adenopathy. No cervical spine tenderness to palpation. Cardiovascular: Normal rate, regular rhythm. Grossly normal heart sounds.  Good peripheral circulation. Respiratory: Normal respiratory effort.  No retractions. Lungs CTAB. Gastrointestinal: Soft and nontender. No distention. No abdominal bruits. No CVA tenderness. Musculoskeletal: Nml ROM of upper and lower extremity joints. Neurologic:  Normal speech and language. No gross focal neurologic deficits are appreciated. No gait instability. Skin:  Skin is warm, dry and intact. No rash noted. Psychiatric: Mood and affect are normal. Speech and behavior are normal.  ____________________________________________   LABS (all labs ordered are listed, but only abnormal results are displayed)  Labs Reviewed  RAPID INFLUENZA A&B ANTIGENS ([facility] ONLY)   ____________________________________________  EKG   ____________________________________________  RADIOLOGY   ____________________________________________   PROCEDURES  Procedure(s)  performed: None  Critical Care performed: No  ____________________________________________   INITIAL IMPRESSION / ASSESSMENT AND PLAN / ED COURSE  Pertinent labs & imaging results that were available during my care of the patient were reviewed by me and considered in my medical decision making (see chart for details).  35 -year-old male with history of hypertension, sleep apnea, cardiomyopathy with defibrillator, who presents with acute onset of myalgias, fever, sore throat and cough. He denies shortness of breath or chest pain.  Rapid flu test  was negative. Suspect viral respiratory infection. There's over-the-counter treatment for fever and congestion. Given Hycodan. If symptoms worsen may begin amoxicillin, though discussed the antibiotic does not kill viruses. Nonetheless he is a high-risk patient and should return for any worsening symptoms. ____________________________________________   FINAL CLINICAL IMPRESSION(S) / ED DIAGNOSES  Final diagnoses:  Viral syndrome      Ignacia BayleyRobert Roldan Laforest, PA-C 02/23/15 1830  Jeanmarie PlantJames A McShane, MD 02/23/15 2017

## 2015-02-23 NOTE — ED Notes (Signed)
Pt reports body aches, chills, cough, fever since yesterday.

## 2015-02-23 NOTE — ED Notes (Signed)
Developed body aches ,fever and cough since yesterday.

## 2015-05-03 ENCOUNTER — Encounter: Payer: Self-pay | Admitting: Nurse Practitioner

## 2015-05-03 ENCOUNTER — Ambulatory Visit (INDEPENDENT_AMBULATORY_CARE_PROVIDER_SITE_OTHER): Payer: Self-pay | Admitting: Nurse Practitioner

## 2015-05-03 ENCOUNTER — Encounter: Payer: Self-pay | Admitting: Internal Medicine

## 2015-05-03 VITALS — BP 132/82 | HR 60 | Ht 66.0 in | Wt 245.0 lb

## 2015-05-03 DIAGNOSIS — G4733 Obstructive sleep apnea (adult) (pediatric): Secondary | ICD-10-CM

## 2015-05-03 DIAGNOSIS — I421 Obstructive hypertrophic cardiomyopathy: Secondary | ICD-10-CM

## 2015-05-03 DIAGNOSIS — I10 Essential (primary) hypertension: Secondary | ICD-10-CM

## 2015-05-03 LAB — CUP PACEART INCLINIC DEVICE CHECK
Date Time Interrogation Session: 20170315095559
Implantable Lead Implant Date: 20160217
Implantable Lead Location: 753860
Implantable Lead Model: 5076
Lead Channel Setting Sensing Sensitivity: 0.3 mV
MDC IDC LEAD IMPLANT DT: 20160217
MDC IDC LEAD LOCATION: 753859
MDC IDC SET LEADCHNL RA PACING AMPLITUDE: 1.5 V
MDC IDC SET LEADCHNL RV PACING AMPLITUDE: 2.5 V
MDC IDC SET LEADCHNL RV PACING PULSEWIDTH: 0.4 ms

## 2015-05-03 NOTE — Patient Instructions (Signed)
Medication Instructions:   Your physician recommends that you continue on your current medications as directed. Please refer to the Current Medication list given to you today.  If you need a refill on your cardiac medications before your next appointment, please call your pharmacy.  Labwork:  NONE ORDER TODAY    Testing/Procedures: NONE ORDER TODAY    Follow-Up:  Your physician wants you to follow-up in: ONE YEAR WITH Graciela HusbandsKLEIN  You will receive a reminder letter in the mail two months in advance. If you don't receive a letter, please call our office to schedule the follow-up appointment.  Remote monitoring is used to monitor your Pacemaker of ICD from home. This monitoring reduces the number of office visits required to check your device to one time per year. It allows us to keep an eye on the functioning of your device to ensure it is working properly. You are scheduled for a device check from home on .08/04/15..You may send your transmission at any time that day. If you have a wireless device, the transmission will be sent automatically. After your physician reviews your transmission, you will receive a postcard with your next transmission date.     Any Other Special Instructions Will Be Listed Below (If Applicable).

## 2015-05-03 NOTE — Progress Notes (Signed)
Electrophysiology Office Note Date: 05/03/2015  ID:  Dishon, Kehoe Feb 14, 1961, MRN [id number]  PCP: Lars Masson, MD Primary Cardiologist: Delton See Electrophysiologist: Graciela Husbands  CC: Routine ICD follow-up  Coner Gibbard is a 55 y.o. male seen today for Dr Graciela Husbands.  He presents today for routine electrophysiology followup.  Since last being seen in our clinic, the patient reports doing very well. He has occasional orthostatic intolerance but denies chest pain, palpitations, dyspnea, PND, orthopnea, nausea, vomiting, syncope, edema, weight gain, or early satiety.  He has not had ICD shocks.   Device History: MDT dual chamber ICD implanted 2016 for HCM History of appropriate therapy: No History of AAD therapy: No   Past Medical History  Diagnosis Date  . Gout   . Hyperlipidemia   . Hypertrophic cardiomyopathy (HCC) 09/30/2013    a. s/p MDT dual chamber ICD implant 03/2014 followed by Dr Graciela Husbands  . Hypertension 09/30/2013  . OSA (obstructive sleep apnea)   . Stroke (HCC) 09/22/2013    a. non-hemorrhagic b. residual left sided weakness  . OSA (obstructive sleep apnea) 10/28/2014    Severe with AHI 78/hr now on CPAP   Past Surgical History  Procedure Laterality Date  . Implantable cardioverter defibrillator implant N/A 04/06/2014    MDT MRI compatible dual chamber ICD implanted by Dr Graciela Husbands    Current Outpatient Prescriptions  Medication Sig Dispense Refill  . amoxicillin (AMOXIL) 500 MG tablet Take 1 tablet (500 mg total) by mouth 3 (three) times daily. 30 tablet 0  . aspirin EC 81 MG tablet Take 81 mg by mouth daily.    . furosemide (LASIX) 20 MG tablet Take 1 tablet (20 mg total) by mouth daily. 30 tablet 3  . HYDROcodone-acetaminophen (NORCO/VICODIN) 5-325 MG per tablet Take 1 tablet by mouth every 6 (six) hours as needed for moderate pain or severe pain. 10 tablet 0  . HYDROcodone-homatropine (HYCODAN) 5-1.5 MG/5ML syrup Take 5 mLs by mouth every 6 (six) hours as needed for cough.  120 mL 0  . hydrOXYzine (ATARAX/VISTARIL) 50 MG tablet Take 1 tablet (50 mg total) by mouth 3 (three) times daily as needed. 30 tablet 0  . propranolol ER (INDERAL LA) 80 MG 24 hr capsule Take 1 capsule (80 mg total) by mouth daily. 30 capsule 5  . quinapril (ACCUPRIL) 40 MG tablet Take 40 mg by mouth daily.    . simvastatin (ZOCOR) 20 MG tablet Take 20 mg by mouth at bedtime.     No current facility-administered medications for this visit.    Allergies:   Review of patient's allergies indicates no known allergies.   Social History: Social History   Social History  . Marital Status: Married    Spouse Name: N/A  . Number of Children: N/A  . Years of Education: N/A   Occupational History  . Not on file.   Social History Main Topics  . Smoking status: Never Smoker   . Smokeless tobacco: Current User    Types: Snuff  . Alcohol Use: 1.2 oz/week    2 Cans of beer per week  . Drug Use: No  . Sexual Activity: Yes   Other Topics Concern  . Not on file   Social History Narrative    Family History: Family History  Problem Relation Age of Onset  . Anemia Neg Hx   . Arrhythmia Neg Hx   . Asthma Neg Hx   . Cancer Neg Hx   . Depression Neg Hx   . Diabetes  Other     family history  . Heart failure Neg Hx   . Hyperlipidemia Neg Hx   . Kidney disease Neg Hx   . Thyroid disease Neg Hx   . Fainting Neg Hx   . Stroke Neg Hx   . Heart attack Mother   . Sudden death Mother   . Hypertension Mother   . Drug abuse Brother   . Sudden death Father     Review of Systems: All other systems reviewed and are otherwise negative except as noted above.   Physical Exam: VS:  BP 132/82 mmHg  Pulse 60  Ht 5\' 6"  (1.676 m)  Wt 245 lb (111.131 kg)  BMI 39.56 kg/m2 , BMI Body mass index is 39.56 kg/(m^2).  GEN- The patient is obese appearing, alert and oriented x 3 today.   HEENT: normocephalic, atraumatic; sclera clear, conjunctiva pink; hearing intact; oropharynx clear; neck supple    Lungs- Clear to ausculation bilaterally, normal work of breathing.  No wheezes, rales, rhonchi Heart- Regular rate and rhythm, 2/6 SM  GI- soft, non-tender, non-distended, bowel sounds present Extremities- no clubbing, cyanosis, or edema  MS- no significant deformity or atrophy Skin- warm and dry, no rash or lesion; ICD pocket well healed Psych- euthymic mood, full affect Neuro- strength and sensation are intact  ICD interrogation- reviewed in detail today,  See PACEART report  EKG:  EKG is not ordered today.  Recent Labs: 08/12/2014: ALT 22; BUN 11; Creatinine, Ser 0.88; Hemoglobin 13.3; Platelets 148*; Potassium 4.0; Sodium 140   Wt Readings from Last 3 Encounters:  05/03/15 245 lb (111.131 kg)  02/23/15 230 lb (104.327 kg)  12/06/14 238 lb 1.9 oz (108.011 kg)     Other studies Reviewed: Additional studies/ records that were reviewed today include: Dr Odessa FlemingKlein's office notes   Assessment and Plan:  1.  HCM Stable on an appropriate medical regimen Normal ICD function See Pace Art report No changes today  2. OSA Compliance with CPAP encouraged   3.  HTN Stable No change required today  4.  Obesity Weight loss advised    Current medicines are reviewed at length with the patient today.   The patient does not have concerns regarding his medicines.  The following changes were made today:  none  Labs/ tests ordered today include: none    Disposition:   Follow up with [facility], Dr Graciela HusbandsKlein in 1 year     Signed, Gypsy BalsamAmber Daksha Koone, NP 05/03/2015 9:52 AM  [facility] [address] [place] [address] [phone] (office) [phone] (fax)

## 2015-07-17 ENCOUNTER — Encounter: Payer: Self-pay | Admitting: Cardiology

## 2015-08-02 ENCOUNTER — Ambulatory Visit (INDEPENDENT_AMBULATORY_CARE_PROVIDER_SITE_OTHER): Payer: Self-pay | Admitting: *Deleted

## 2015-08-02 DIAGNOSIS — I422 Other hypertrophic cardiomyopathy: Secondary | ICD-10-CM

## 2015-08-02 LAB — CUP PACEART REMOTE DEVICE CHECK
Battery Remaining Longevity: 118 mo
Battery Voltage: 3.02 V
Brady Statistic AP VP Percent: 0.06 %
Brady Statistic AP VS Percent: 49.22 %
Brady Statistic AS VP Percent: 0.02 %
Brady Statistic AS VS Percent: 50.7 %
Date Time Interrogation Session: 20170614073424
HighPow Impedance: 64 Ohm
Implantable Lead Implant Date: 20160217
Implantable Lead Location: 753860
Lead Channel Impedance Value: 342 Ohm
Lead Channel Impedance Value: 418 Ohm
Lead Channel Pacing Threshold Amplitude: 0.5 V
Lead Channel Pacing Threshold Pulse Width: 0.4 ms
Lead Channel Pacing Threshold Pulse Width: 0.4 ms
Lead Channel Sensing Intrinsic Amplitude: 3.125 mV
Lead Channel Setting Sensing Sensitivity: 0.3 mV
MDC IDC LEAD IMPLANT DT: 20160217
MDC IDC LEAD LOCATION: 753859
MDC IDC MSMT LEADCHNL RA IMPEDANCE VALUE: 456 Ohm
MDC IDC MSMT LEADCHNL RA SENSING INTR AMPL: 3.125 mV
MDC IDC MSMT LEADCHNL RV PACING THRESHOLD AMPLITUDE: 0.625 V
MDC IDC MSMT LEADCHNL RV SENSING INTR AMPL: 31.625 mV
MDC IDC MSMT LEADCHNL RV SENSING INTR AMPL: 31.625 mV
MDC IDC SET LEADCHNL RA PACING AMPLITUDE: 1.5 V
MDC IDC SET LEADCHNL RV PACING AMPLITUDE: 2.5 V
MDC IDC SET LEADCHNL RV PACING PULSEWIDTH: 0.4 ms
MDC IDC STAT BRADY RA PERCENT PACED: 49.28 %
MDC IDC STAT BRADY RV PERCENT PACED: 0.08 %

## 2015-08-02 NOTE — Progress Notes (Signed)
Remote ICD transmission.   

## 2015-08-09 ENCOUNTER — Encounter: Payer: Self-pay | Admitting: Cardiology

## 2015-11-01 ENCOUNTER — Ambulatory Visit (INDEPENDENT_AMBULATORY_CARE_PROVIDER_SITE_OTHER): Payer: Self-pay | Admitting: *Deleted

## 2015-11-01 DIAGNOSIS — I422 Other hypertrophic cardiomyopathy: Secondary | ICD-10-CM

## 2015-11-01 NOTE — Progress Notes (Signed)
Remote ICD transmission.   

## 2015-11-02 ENCOUNTER — Encounter: Payer: Self-pay | Admitting: Cardiology

## 2015-11-09 LAB — CUP PACEART REMOTE DEVICE CHECK
Brady Statistic AP VS Percent: 40.35 %
Brady Statistic AS VP Percent: 0.03 %
Date Time Interrogation Session: 20170913041805
HighPow Impedance: 60 Ohm
Implantable Lead Implant Date: 20160217
Implantable Lead Location: 753859
Implantable Lead Model: 5076
Lead Channel Impedance Value: 475 Ohm
Lead Channel Pacing Threshold Amplitude: 0.625 V
Lead Channel Sensing Intrinsic Amplitude: 2.75 mV
Lead Channel Sensing Intrinsic Amplitude: 2.75 mV
Lead Channel Sensing Intrinsic Amplitude: 31.625 mV
Lead Channel Setting Pacing Amplitude: 2.5 V
Lead Channel Setting Pacing Pulse Width: 0.4 ms
MDC IDC LEAD IMPLANT DT: 20160217
MDC IDC LEAD LOCATION: 753860
MDC IDC MSMT BATTERY REMAINING LONGEVITY: 115 mo
MDC IDC MSMT BATTERY VOLTAGE: 3.01 V
MDC IDC MSMT LEADCHNL RA PACING THRESHOLD AMPLITUDE: 0.5 V
MDC IDC MSMT LEADCHNL RA PACING THRESHOLD PULSEWIDTH: 0.4 ms
MDC IDC MSMT LEADCHNL RV IMPEDANCE VALUE: 342 Ohm
MDC IDC MSMT LEADCHNL RV IMPEDANCE VALUE: 418 Ohm
MDC IDC MSMT LEADCHNL RV PACING THRESHOLD PULSEWIDTH: 0.4 ms
MDC IDC MSMT LEADCHNL RV SENSING INTR AMPL: 31.625 mV
MDC IDC SET LEADCHNL RA PACING AMPLITUDE: 1.5 V
MDC IDC SET LEADCHNL RV SENSING SENSITIVITY: 0.3 mV
MDC IDC STAT BRADY AP VP PERCENT: 0.05 %
MDC IDC STAT BRADY AS VS PERCENT: 59.58 %
MDC IDC STAT BRADY RA PERCENT PACED: 40.4 %
MDC IDC STAT BRADY RV PERCENT PACED: 0.07 %

## 2015-12-06 ENCOUNTER — Ambulatory Visit (INDEPENDENT_AMBULATORY_CARE_PROVIDER_SITE_OTHER): Payer: Self-pay | Admitting: Cardiology

## 2015-12-06 ENCOUNTER — Encounter: Payer: Self-pay | Admitting: Cardiology

## 2015-12-06 VITALS — BP 130/70 | HR 62 | Ht 66.0 in | Wt 238.8 lb

## 2015-12-06 DIAGNOSIS — I10 Essential (primary) hypertension: Secondary | ICD-10-CM

## 2015-12-06 DIAGNOSIS — G4733 Obstructive sleep apnea (adult) (pediatric): Secondary | ICD-10-CM

## 2015-12-06 NOTE — Patient Instructions (Signed)

## 2015-12-06 NOTE — Progress Notes (Signed)
Cardiology Office Note    Date:  12/06/2015   ID:  Marc Bradford, DOB 09-Dec-1960, MRN [id number]  PCP:  No primary care provider on file.  Cardiologist:  Armanda Magic, MD   Chief Complaint  Patient presents with  . Sleep Apnea  . Hypertension    History of Present Illness:  Marc Bradford is a 55 y.o. male  who presents for followup of OSA.  He has  severe OSA with an AHI of 77.8/hr and is on CPAP at 11cm H2O.  He is doing well with his CPAP.  He tolerates the nasal mask and feels the pressure is adequate for the most part.  He continues to feel more rested in the am.  He occasionally will have some daytime sleepiness after eating. His snoring has resolved.   He has no mouth dryness but does have some nasal congestion.  Past Medical History:  Diagnosis Date  . Gout   . Hyperlipidemia   . Hypertension 09/30/2013  . 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 s/p MDT dual chamber ICD implant 03/2014 followed by Dr Graciela Husbands  . OSA (obstructive sleep apnea) 10/28/2014   Severe with AHI 78/hr now on CPAP  . Stroke (HCC) 09/22/2013   a. non-hemorrhagic b. residual left sided weakness    Past Surgical History:  Procedure Laterality Date  . IMPLANTABLE CARDIOVERTER DEFIBRILLATOR IMPLANT N/A 04/06/2014   MDT MRI compatible dual chamber ICD implanted by Dr Graciela Husbands    Current Medications: Outpatient Medications Prior to Visit  Medication Sig Dispense Refill  . amoxicillin (AMOXIL) 500 MG tablet Take 1 tablet (500 mg total) by mouth 3 (three) times daily. 30 tablet 0  . aspirin EC 81 MG tablet Take 81 mg by mouth daily.    . furosemide (LASIX) 20 MG tablet Take 1 tablet (20 mg total) by mouth daily. 30 tablet 3  . HYDROcodone-acetaminophen (NORCO/VICODIN) 5-325 MG per tablet Take 1 tablet by mouth every 6 (six) hours as needed for moderate pain or severe pain. 10 tablet 0  . HYDROcodone-homatropine (HYCODAN) 5-1.5 MG/5ML syrup Take 5 mLs by mouth every 6 (six) hours as needed for cough. 120 mL  0  . hydrOXYzine (ATARAX/VISTARIL) 50 MG tablet Take 1 tablet (50 mg total) by mouth 3 (three) times daily as needed. 30 tablet 0  . propranolol ER (INDERAL LA) 80 MG 24 hr capsule Take 1 capsule (80 mg total) by mouth daily. 30 capsule 5  . quinapril (ACCUPRIL) 40 MG tablet Take 40 mg by mouth daily.    . simvastatin (ZOCOR) 20 MG tablet Take 20 mg by mouth at bedtime.     No facility-administered medications prior to visit.      Allergies:   Review of patient's allergies indicates no known allergies.   Social History   Social History  . Marital status: Married    Spouse name: N/A  . Number of children: N/A  . Years of education: N/A   Social History Main Topics  . Smoking status: Never Smoker  . Smokeless tobacco: Current User    Types: Snuff  . Alcohol use 1.2 oz/week    2 Cans of beer per week  . Drug use: No  . Sexual activity: Yes   Other Topics Concern  . None   Social History Narrative  . None     Family History:  The patient's family history includes Diabetes in his other; Drug abuse in his brother; Heart attack in his mother; Hypertension in his mother;  Sudden death in his father and mother.   ROS:   Please see the history of present illness.    ROS All other systems reviewed and are negative.  No flowsheet data found.     PHYSICAL EXAM:   VS:  BP 130/70   Pulse 62   Ht 5\' 6"  (1.676 m)   Wt 238 lb 12.8 oz (108.3 kg)   SpO2 98%   BMI 38.54 kg/m    GEN: Well nourished, well developed, in no acute distress  HEENT: normal  Neck: no JVD, carotid bruits, or masses Cardiac: RRR; no murmurs, rubs, or gallops,no edema.  Intact distal pulses bilaterally.  Respiratory:  clear to auscultation bilaterally, normal work of breathing GI: soft, nontender, nondistended, + BS MS: no deformity or atrophy  Skin: warm and dry, no rash Neuro:  Alert and Oriented x 3, Strength and sensation are intact Psych: euthymic mood, full affect  Wt Readings from Last 3  Encounters:  12/06/15 238 lb 12.8 oz (108.3 kg)  05/03/15 245 lb (111.1 kg)  02/23/15 230 lb (104.3 kg)      Studies/Labs Reviewed:   EKG:  EKG is not ordered today.   Recent Labs: No results found for requested labs within last 8760 hours.   Lipid Panel    Component Value Date/Time   CHOL 180 09/29/2013 0238   CHOL 120 05/24/2011 0444   TRIG 382 (H) 09/29/2013 0238   TRIG 288 (H) 05/24/2011 0444   HDL 35 (L) 09/29/2013 0238   HDL 28 (L) 05/24/2011 0444   CHOLHDL 5.1 09/29/2013 0238   VLDL 76 (H) 09/29/2013 0238   VLDL 58 (H) 05/24/2011 0444   LDLCALC 69 09/29/2013 0238   LDLCALC 34 05/24/2011 0444    Additional studies/ records that were reviewed today include:  None    ASSESSMENT:    1. OSA (obstructive sleep apnea)   2. Essential hypertension   3. Morbid obesity (HCC)      PLAN:  In order of problems listed above:  OSA - the patient is tolerating PAP therapy well without any problems. The patient has been using and benefiting from CPAP use and will continue to benefit from therapy. I will get a download from his DME. 2.  HTN - BP controlled on current meds.  Continue amlodipine/ACE I/BB 3.  Morbid obesity - He has lost 7 lbs.  I have encouraged him to continue to walk daily and cut back on carbs and portions.     Medication Adjustments/Labs and Tests Ordered: Current medicines are reviewed at length with the patient today.  Concerns regarding medicines are outlined above.  Medication changes, Labs and Tests ordered today are listed in the Patient Instructions below.  There are no Patient Instructions on file for this visit.   Signed, Armanda Magicraci Turner, MD  12/06/2015 10:15 AM    [facility] [address], [place], [place]  [address] Phone: [phone]; Fax: [phone]

## 2015-12-08 ENCOUNTER — Emergency Department: Payer: No Typology Code available for payment source

## 2015-12-08 ENCOUNTER — Encounter: Payer: Self-pay | Admitting: *Deleted

## 2015-12-08 ENCOUNTER — Emergency Department
Admission: EM | Admit: 2015-12-08 | Discharge: 2015-12-09 | Disposition: A | Discharge status: Home / Self Care | Payer: No Typology Code available for payment source | Attending: Emergency Medicine | Admitting: Emergency Medicine

## 2015-12-08 DIAGNOSIS — S0990XA Unspecified injury of head, initial encounter: Secondary | ICD-10-CM | POA: Diagnosis not present

## 2015-12-08 DIAGNOSIS — T1490XA Injury, unspecified, initial encounter: Secondary | ICD-10-CM

## 2015-12-08 DIAGNOSIS — Y92410 Unspecified street and highway as the place of occurrence of the external cause: Secondary | ICD-10-CM | POA: Insufficient documentation

## 2015-12-08 DIAGNOSIS — M549 Dorsalgia, unspecified: Secondary | ICD-10-CM | POA: Insufficient documentation

## 2015-12-08 DIAGNOSIS — Y999 Unspecified external cause status: Secondary | ICD-10-CM | POA: Diagnosis not present

## 2015-12-08 DIAGNOSIS — Z23 Encounter for immunization: Secondary | ICD-10-CM | POA: Insufficient documentation

## 2015-12-08 DIAGNOSIS — S3991XA Unspecified injury of abdomen, initial encounter: Secondary | ICD-10-CM | POA: Diagnosis present

## 2015-12-08 DIAGNOSIS — S30811A Abrasion of abdominal wall, initial encounter: Secondary | ICD-10-CM | POA: Diagnosis not present

## 2015-12-08 DIAGNOSIS — Z7982 Long term (current) use of aspirin: Secondary | ICD-10-CM | POA: Diagnosis not present

## 2015-12-08 DIAGNOSIS — M79605 Pain in left leg: Secondary | ICD-10-CM | POA: Diagnosis not present

## 2015-12-08 DIAGNOSIS — M542 Cervicalgia: Secondary | ICD-10-CM | POA: Insufficient documentation

## 2015-12-08 DIAGNOSIS — M79604 Pain in right leg: Secondary | ICD-10-CM | POA: Insufficient documentation

## 2015-12-08 DIAGNOSIS — F17290 Nicotine dependence, other tobacco product, uncomplicated: Secondary | ICD-10-CM | POA: Diagnosis not present

## 2015-12-08 DIAGNOSIS — R079 Chest pain, unspecified: Secondary | ICD-10-CM | POA: Insufficient documentation

## 2015-12-08 DIAGNOSIS — Y9355 Activity, bike riding: Secondary | ICD-10-CM | POA: Insufficient documentation

## 2015-12-08 DIAGNOSIS — Z79899 Other long term (current) drug therapy: Secondary | ICD-10-CM | POA: Insufficient documentation

## 2015-12-08 DIAGNOSIS — I10 Essential (primary) hypertension: Secondary | ICD-10-CM | POA: Diagnosis not present

## 2015-12-08 LAB — CBC WITH DIFFERENTIAL/PLATELET
Basophils Absolute: 0.1 10*3/uL (ref 0–0.1)
Basophils Relative: 1 %
EOS ABS: 0.2 10*3/uL (ref 0–0.7)
Eosinophils Relative: 3 %
HCT: 40.8 % (ref 40.0–52.0)
HEMOGLOBIN: 13.4 g/dL (ref 13.0–18.0)
LYMPHS ABS: 2.2 10*3/uL (ref 1.0–3.6)
LYMPHS PCT: 34 %
MCH: 32.8 pg (ref 26.0–34.0)
MCHC: 32.9 g/dL (ref 32.0–36.0)
MCV: 99.5 fL (ref 80.0–100.0)
Monocytes Absolute: 0.7 10*3/uL (ref 0.2–1.0)
Monocytes Relative: 11 %
NEUTROS PCT: 51 %
Neutro Abs: 3.3 10*3/uL (ref 1.4–6.5)
Platelets: 187 10*3/uL (ref 150–440)
RBC: 4.1 MIL/uL — AB (ref 4.40–5.90)
RDW: 13.8 % (ref 11.5–14.5)
WBC: 6.5 10*3/uL (ref 3.8–10.6)

## 2015-12-08 LAB — COMPREHENSIVE METABOLIC PANEL
ALK PHOS: 64 U/L (ref 38–126)
ALT: 28 U/L (ref 17–63)
AST: 28 U/L (ref 15–41)
Albumin: 4.3 g/dL (ref 3.5–5.0)
Anion gap: 10 (ref 5–15)
BUN: 11 mg/dL (ref 6–20)
CALCIUM: 9.1 mg/dL (ref 8.9–10.3)
CO2: 22 mmol/L (ref 22–32)
CREATININE: 0.8 mg/dL (ref 0.61–1.24)
Chloride: 107 mmol/L (ref 101–111)
GFR calc non Af Amer: >60 mL/min (ref >60)
GLUCOSE: 114 mg/dL — AB (ref 65–99)
Potassium: 3.4 mmol/L — ABNORMAL LOW (ref 3.5–5.1)
SODIUM: 139 mmol/L (ref 135–145)
Total Bilirubin: 1.2 mg/dL (ref 0.3–1.2)
Total Protein: 7.6 g/dL (ref 6.5–8.1)

## 2015-12-08 LAB — TROPONIN I
Troponin I: 0.03 ng/mL (ref <0.03)
Troponin I: 0.04 ng/mL (ref <0.03)

## 2015-12-08 LAB — CK: CK TOTAL: 380 U/L (ref 49–397)

## 2015-12-08 LAB — ETHANOL: Alcohol, Ethyl (B): 144 mg/dL — ABNORMAL HIGH (ref <5)

## 2015-12-08 MED ORDER — FENTANYL CITRATE (PF) 100 MCG/2ML IJ SOLN
50.0000 ug | Freq: Once | INTRAMUSCULAR | Status: AC
  Administered 2015-12-08: 50 ug via INTRAVENOUS
  Filled 2015-12-08: qty 2

## 2015-12-08 MED ORDER — FENTANYL CITRATE (PF) 100 MCG/2ML IJ SOLN
INTRAMUSCULAR | Status: AC
  Filled 2015-12-08: qty 2

## 2015-12-08 MED ORDER — TETANUS-DIPHTH-ACELL PERTUSSIS 5-2.5-18.5 LF-MCG/0.5 IM SUSP
0.5000 mL | Freq: Once | INTRAMUSCULAR | Status: AC
  Administered 2015-12-09: 0.5 mL via INTRAMUSCULAR
  Filled 2015-12-08: qty 0.5

## 2015-12-08 MED ORDER — IOPAMIDOL (ISOVUE-300) INJECTION 61%
125.0000 mL | Freq: Once | INTRAVENOUS | Status: AC | PRN
  Administered 2015-12-08: 125 mL via INTRAVENOUS

## 2015-12-08 MED ORDER — FENTANYL CITRATE (PF) 100 MCG/2ML IJ SOLN
100.0000 ug | Freq: Once | INTRAMUSCULAR | Status: AC
  Administered 2015-12-08: 100 ug via INTRAVENOUS

## 2015-12-08 NOTE — ED Notes (Signed)
Patient left for imaging. 
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
## 2015-12-08 NOTE — ED Notes (Addendum)
C-collar removed and patient placed upright per Dr. Roxan Hockeyobinson. Patient is alert and oriented.

## 2015-12-08 NOTE — ED Notes (Signed)
Patient is drowsy. Patient placed on 2L O2 via [place] for sats of 92%.

## 2015-12-09 LAB — TROPONIN I: Troponin I: 0.03 ng/mL (ref <0.03)

## 2015-12-09 MED ORDER — OXYCODONE-ACETAMINOPHEN 5-325 MG PO TABS
ORAL_TABLET | ORAL | Status: AC
  Administered 2015-12-09: 1 via ORAL
  Filled 2015-12-09: qty 1

## 2015-12-09 MED ORDER — OXYCODONE-ACETAMINOPHEN 5-325 MG PO TABS
1.0000 | ORAL_TABLET | Freq: Once | ORAL | Status: AC
  Administered 2015-12-09: 1 via ORAL

## 2015-12-09 MED ORDER — BACITRACIN ZINC 500 UNIT/GM EX OINT
TOPICAL_OINTMENT | CUTANEOUS | Status: AC
  Filled 2015-12-09: qty 0.9

## 2015-12-09 MED ORDER — BACITRACIN ZINC 500 UNIT/GM EX OINT
TOPICAL_OINTMENT | Freq: Once | CUTANEOUS | Status: AC
Start: 2015-12-09 — End: 2015-12-09
  Administered 2015-12-09: 1 via TOPICAL

## 2015-12-09 NOTE — ED Notes (Signed)
Pt. Going home with family. 
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
## 2016-01-15 ENCOUNTER — Telehealth: Payer: Self-pay | Admitting: Cardiology
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
## 2016-01-15 NOTE — Telephone Encounter (Signed)
Opened in error

## 2016-01-31 ENCOUNTER — Ambulatory Visit (INDEPENDENT_AMBULATORY_CARE_PROVIDER_SITE_OTHER): Payer: Self-pay | Admitting: *Deleted

## 2016-01-31 DIAGNOSIS — I421 Obstructive hypertrophic cardiomyopathy: Secondary | ICD-10-CM

## 2016-01-31 NOTE — Progress Notes (Signed)
Remote ICD transmission.   

## 2016-02-07 ENCOUNTER — Encounter: Payer: Self-pay | Admitting: Cardiology

## 2016-02-16 LAB — CUP PACEART REMOTE DEVICE CHECK
Battery Voltage: 3.01 V
Brady Statistic AP VP Percent: 0.05 %
Brady Statistic AP VS Percent: 38.04 %
Brady Statistic AS VP Percent: 0.03 %
Brady Statistic AS VS Percent: 61.88 %
Brady Statistic RV Percent Paced: 0.08 %
HighPow Impedance: 62 Ohm
Implantable Lead Implant Date: 20160217
Implantable Lead Implant Date: 20160217
Implantable Lead Location: 753859
Implantable Pulse Generator Implant Date: 20160217
Lead Channel Impedance Value: 342 Ohm
Lead Channel Impedance Value: 456 Ohm
Lead Channel Pacing Threshold Amplitude: 0.75 V
Lead Channel Pacing Threshold Pulse Width: 0.4 ms
Lead Channel Pacing Threshold Pulse Width: 0.4 ms
Lead Channel Sensing Intrinsic Amplitude: 2.875 mV
Lead Channel Sensing Intrinsic Amplitude: 31.625 mV
Lead Channel Setting Pacing Amplitude: 1.5 V
Lead Channel Setting Pacing Amplitude: 2.5 V
Lead Channel Setting Sensing Sensitivity: 0.3 mV
MDC IDC LEAD LOCATION: 753860
MDC IDC MSMT BATTERY REMAINING LONGEVITY: 112 mo
MDC IDC MSMT LEADCHNL RA PACING THRESHOLD AMPLITUDE: 0.5 V
MDC IDC MSMT LEADCHNL RA SENSING INTR AMPL: 2.875 mV
MDC IDC MSMT LEADCHNL RV IMPEDANCE VALUE: 456 Ohm
MDC IDC MSMT LEADCHNL RV SENSING INTR AMPL: 31.625 mV
MDC IDC SESS DTM: 20171213051703
MDC IDC SET LEADCHNL RV PACING PULSEWIDTH: 0.4 ms
MDC IDC STAT BRADY RA PERCENT PACED: 38.05 %

## 2016-03-25 ENCOUNTER — Telehealth: Payer: Self-pay | Admitting: Cardiology

## 2016-03-25 DIAGNOSIS — G4733 Obstructive sleep apnea (adult) (pediatric): Secondary | ICD-10-CM

## 2016-03-25 NOTE — Telephone Encounter (Signed)
New message    Pt wife calling asking if he can have an order for the mask and cord for his Cpap machine. She is asking if medicare will cover these?

## 2016-03-25 NOTE — Telephone Encounter (Signed)
Patient states his CPAP is broken and he needs new parts. He is unsure of what needs fixing, but knows his mask is broken.  Informed patient [facility] will call him to troubleshoot for orders. He was grateful for call.

## 2016-03-26 ENCOUNTER — Other Ambulatory Visit: Payer: Self-pay | Admitting: *Deleted

## 2016-03-26 NOTE — Telephone Encounter (Signed)
I will put in an order for a mask refit, cpap supplies and a machine check. The patient initially obtained his machine from the [employer] and not [facility] ,he has [employer] insurance if they don't pay for his supplies he will have to self pay [facility]

## 2016-03-26 NOTE — Telephone Encounter (Signed)
[facility]ed [facility] and spoke to [place] who states the patient is a self pay but [facility] will [facility] out to the patient to see how they can help

## 2016-03-26 NOTE — Addendum Note (Signed)
Addended by: Reesa ChewJONES, Lashonta Pilling G on: 03/26/2016 04:33 PM   Modules accepted: Orders

## 2016-03-26 NOTE — Telephone Encounter (Signed)
Order in for mask refit, cpap supplies and machine check

## 2016-03-26 NOTE — Telephone Encounter (Signed)
Follow up      Calling to ask us to [facility] for a machine and supplies.  Pt cannot use his machine and they have not heard from [facility].  Please call and give pt update on what [facility] is doing.

## 2016-03-27 ENCOUNTER — Telehealth: Payer: Self-pay | Admitting: *Deleted

## 2016-03-27 NOTE — Telephone Encounter (Addendum)
I called the patient and spoke to his wife concerning his cpap machine. She said initially the patient had no insurance and received a cpap through the sleep [facility]nea association but now he has medicare and would like to get a new machine  I called [facility] and spoke to [place]enny in the referral dept.who tells me that the patient needs to have a recent  face to face appointment with the doctor and then he needs to have a diagnostic sleep study before he is eligible for a new machine.

## 2016-03-27 NOTE — Telephone Encounter (Signed)
-----   Message from Henrietta DineKathryn A Kemp, RN sent at 03/25/2016  3:57 PM EST ----- Regarding: broken PAP Melissa - please have someone call this patient. He says his machine is broken and he his pins in it to hold it together. We need to figure out what it is we need to order so his machine works.  Thanks so much!  Orpha BurKaty

## 2016-04-05 ENCOUNTER — Encounter: Payer: Self-pay | Admitting: *Deleted

## 2016-04-16 ENCOUNTER — Ambulatory Visit (INDEPENDENT_AMBULATORY_CARE_PROVIDER_SITE_OTHER): Payer: Medicare Other | Admitting: Cardiology

## 2016-04-16 ENCOUNTER — Encounter (INDEPENDENT_AMBULATORY_CARE_PROVIDER_SITE_OTHER): Payer: Self-pay

## 2016-04-16 ENCOUNTER — Encounter: Payer: Self-pay | Admitting: Cardiology

## 2016-04-16 VITALS — BP 138/80 | HR 82 | Ht 66.0 in | Wt 245.8 lb

## 2016-04-16 DIAGNOSIS — G4733 Obstructive sleep apnea (adult) (pediatric): Secondary | ICD-10-CM | POA: Diagnosis not present

## 2016-04-16 DIAGNOSIS — I10 Essential (primary) hypertension: Secondary | ICD-10-CM | POA: Diagnosis not present

## 2016-04-16 NOTE — Progress Notes (Signed)
Cardiology Office Note    Date:  04/16/2016   ID:  Jarick, Harkins [birth year], MRN [id number]  [facility]P:  [facility]  Cardiologist:  Armanda Magic, MD   Chief Complaint  Patient presents with  . Sleep Apnea  . Hypertension    History of Present Illness:  Marc Bradford is a 56 y.o. male who presents for followup of OSA. He has severe OSA with an AHI of 77.8/hr and is on CPAP at 11cm H2O. He is doing well with his CPAP. He tolerates the nasal mask and feels the pressure is adequate for the most part. He continues to feel more rested in the am.  He occasionally will have some daytime sleepiness after eating. His snoring has resolved. He has no mouth dryness but does have some nasal congestion.    Past Medical History:  Diagnosis Date  . Gout   . Hyperlipidemia   . Hypertension 09/30/2013  . 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 s/p MDT dual chamber ICD implant 03/2014 followed by Dr Graciela Husbands  . OSA (obstructive sleep apnea) 10/28/2014   Severe with AHI 78/hr now on CPAP  . Stroke (HCC) 09/22/2013   a. non-hemorrhagic b. residual left sided weakness    Past Surgical History:  Procedure Laterality Date  . IMPLANTABLE CARDIOVERTER DEFIBRILLATOR IMPLANT N/A 04/06/2014   MDT MRI compatible dual chamber ICD implanted by Dr Graciela Husbands    Current Medications: Current Meds  Medication Sig  . amLODipine (NORVASC) 5 MG tablet Take 5 mg by mouth daily.  Marland Kitchen aspirin EC 81 MG tablet Take 81 mg by mouth daily.  . Cholecalciferol (VITAMIN D PO) Take 1 tablet by mouth daily.  . furosemide (LASIX) 20 MG tablet Take 1 tablet (20 mg total) by mouth daily.  Marland Kitchen HYDROcodone-acetaminophen (NORCO/VICODIN) 5-325 MG per tablet Take 1 tablet by mouth every 6 (six) hours as needed for moderate pain or severe pain.  Marland Kitchen HYDROcodone-homatropine (HYCODAN) 5-1.5 MG/5ML syrup Take 5 mLs by mouth every 6 (six) hours as needed for cough.  . hydrOXYzine (ATARAX/VISTARIL) 50 MG tablet Take 1 tablet (50 mg  total) by mouth 3 (three) times daily as needed.  . propranolol ER (INDERAL LA) 80 MG 24 hr capsule Take 1 capsule (80 mg total) by mouth daily.  . quinapril (ACCUPRIL) 40 MG tablet Take 40 mg by mouth daily.  . simvastatin (ZOCOR) 20 MG tablet Take 20 mg by mouth at bedtime.    Allergies:   Patient has no known allergies.   Social History   Social History  . Marital status: Married    Spouse name: N/A  . Number of children: N/A  . Years of education: N/A   Social History Main Topics  . Smoking status: Never Smoker  . Smokeless tobacco: Current User    Types: Snuff  . Alcohol use 1.2 oz/week    2 Cans of beer per week  . Drug use: No  . Sexual activity: Yes   Other Topics Concern  . None   Social History Narrative  . None     Family History:  The patient's family history includes Diabetes in his other; Drug abuse in his brother; Heart attack in his mother; Hypertension in his mother; Sudden death in his father and mother.   ROS:   Please see the history of present illness.    ROS All other systems reviewed and are negative.  No flowsheet data found.     PHYSICAL EXAM:   VS:  BP 138/80   Pulse 82   Ht 5\' 6"  (1.676 m)   Wt 245 lb 12.8 oz (111.5 kg)   SpO2 96%   BMI 39.67 kg/m    GEN: Well nourished, well developed, in no acute distress  HEENT: normal  Neck: no JVD, carotid bruits, or masses Cardiac: RRR; no murmurs, rubs, or gallops,no edema.  Intact distal pulses bilaterally.  Respiratory:  clear to auscultation bilaterally, normal work of breathing GI: soft, nontender, nondistended, + BS MS: no deformity or atrophy  Skin: warm and dry, no rash Neuro:  Alert and Oriented x 3, Strength and sensation are intact Psych: euthymic mood, full affect  Wt Readings from Last 3 Encounters:  04/16/16 245 lb 12.8 oz (111.5 kg)  12/08/15 238 lb (108 kg)  12/06/15 238 lb 12.8 oz (108.3 kg)      Studies/Labs Reviewed:   EKG:  EKG is not ordered today.   Recent  Labs: 12/08/2015: ALT 28; BUN 11; Creatinine, Ser 0.80; Hemoglobin 13.4; Platelets 187; Potassium 3.4; Sodium 139   Lipid Panel    Component Value Date/Time   CHOL 180 09/29/2013 0238   CHOL 120 05/24/2011 0444   TRIG 382 (H) 09/29/2013 0238   TRIG 288 (H) 05/24/2011 0444   HDL 35 (L) 09/29/2013 0238   HDL 28 (L) 05/24/2011 0444   CHOLHDL 5.1 09/29/2013 0238   VLDL 76 (H) 09/29/2013 0238   VLDL 58 (H) 05/24/2011 0444   LDLCALC 69 09/29/2013 0238   LDLCALC 34 05/24/2011 0444    Additional studies/ records that were reviewed today include:  CPAP download    ASSESSMENT:    1. OSA (obstructive sleep apnea)   2. Essential hypertension   3. Morbid obesity (HCC)      PLAN:  In order of problems listed above:  OSA - the patient is tolerating PAP therapy well without any problems.  The patient has been using and benefiting from CPAP use and will continue to benefit from therapy. I will get a download on his device.  He would like a new device.  I will go ahead and order him and new ResMed CPAP device. HTN - BP controlled on current meds.  He will continue on BB and ACE I. Morbid Obesity -He has not been going back to walking outside  but says that he is going after getting on the scales today.   I have encouraged him to get into a routine exercise program and cut back on carbs and portions.      Medication Adjustments/Labs and Tests Ordered: Current medicines are reviewed at length with the patient today.  Concerns regarding medicines are outlined above.  Medication changes, Labs and Tests ordered today are listed in the Patient Instructions below.  There are no Patient Instructions on file for this visit.   Signed, Armanda Magicraci Alyanah Elliott, MD  04/16/2016 1:35 PM    [facility] [address], [place], [place]  [address] Phone: [phone]; Fax: [phone]

## 2016-04-16 NOTE — Patient Instructions (Signed)
Medication Instructions:  Your physician recommends that you continue on your current medications as directed. Please refer to the Current Medication list given to you today.   Labwork: None  Testing/Procedures: None  Follow-Up: Your physician wants you to follow-up in: 1 year with Dr. Mayford Knifeurner. You will receive a reminder letter in the mail two months in advance. If you don't receive a letter, please call our office to schedule the follow-up appointment.   Any Other Special Instructions Will Be Listed Below (If Applicable). Dr. Mayford Knifeurner has ordered you a new CPAP. Please call our CPAP assistant directly at [phone] if you have any PAP-related questions or concerns.    If you need a refill on your cardiac medications before your next appointment, please call your pharmacy.

## 2016-04-23 ENCOUNTER — Telehealth: Payer: Self-pay | Admitting: Cardiology

## 2016-04-23 NOTE — Telephone Encounter (Signed)
New Message     Pt states he went to pick up CPAP machine and [facility] said that you did not order the machine , just the hoses

## 2016-04-23 NOTE — Telephone Encounter (Signed)
Informed patient that an order for a new machine was in fact placed. He understands the CPAP assistant will follow-up with him in a couple days to make sure [facility] has called him.  [facility] for clarification and to call the patient ASAP.

## 2016-04-24 NOTE — Telephone Encounter (Signed)
Called the patient today to check on the progress of his cpap machine and he informed me that [facility]. I will contact [facility] on Thursday 04/24/16 and I will follow up with the patient with an update on his cpap

## 2016-04-25 ENCOUNTER — Telehealth: Payer: Self-pay | Admitting: *Deleted

## 2016-04-25 NOTE — Telephone Encounter (Signed)
-----   Message from Henrietta DineKathryn A Kemp, RN sent at 04/23/2016  2:53 PM EST ----- Regarding: DME ASAP This patient states [facility] told him only tubing was ordered for him, but the most recent order is for a new machine. Please contact the patient ASAP to update him or let Coralee Northina know what else is necessary for him to get a new machine so she can call him tomorrow.  Thank you!  2/27 order under "other orders": Order Questions   Question Answer Comment Patient has OSA or probable OSA Yes  Settings Other see comments  CPAP supplies needed Mask, headgear, cushions, filters, heated tubing and water chamber    Comments   Order for new Resmed CPAP at 11 cm H2O and supplies

## 2016-04-25 NOTE — Telephone Encounter (Signed)
Called the patient and was informed that he now has medicare. I called [facility]  And spoke to Boneta LucksJenny gave her the new insurance information faxed over the office note and the order so the patient can get his new cpap machine. I will notify the patient that he will be receiving his new machine ASAP.

## 2016-04-26 ENCOUNTER — Telehealth: Payer: Self-pay | Admitting: Cardiology

## 2016-04-26 NOTE — Telephone Encounter (Signed)
Called the patient and he is stating to me that he has not received his cpap yet and [facility] is telling him he needs to pay $68 more dollars this month to get his machine and next month he needs to pay $40. He states he has already paid $112 dollars for the supplies and he thought that included the machine. I explained to him that I do not handle that part of his care.

## 2016-04-26 NOTE — Telephone Encounter (Signed)
Patient is calling because he has not received CPAP machine as yet, also stated he was informed to call back if he had not received it. Please call, thanks.

## 2016-05-22 ENCOUNTER — Encounter: Payer: Self-pay | Admitting: Internal Medicine

## 2016-05-24 ENCOUNTER — Encounter: Payer: Self-pay | Admitting: Internal Medicine

## 2016-06-26 ENCOUNTER — Encounter: Payer: Self-pay | Admitting: Cardiology

## 2016-07-10 ENCOUNTER — Encounter: Payer: Self-pay | Admitting: Cardiology

## 2016-07-15 NOTE — Progress Notes (Deleted)
Cardiology Office Note    Date:  07/15/2016   ID:  Marc Bradford, Marc Bradford, Marc Bradford, MRN [id number]  [facility]  Cardiologist:  Marc Magic, MD   Chief Complaint  Patient presents with  . Sleep Apnea  . Hypertension    History of Present Illness:  Marc Bradford is a 56 y.o. male who presents for followupof OSA. He has severe OSA with an AHI of 77.8/hr and is onCPAP at11cm H2O. He is doing well with his CPAP. He continues to tolerate the nasal mask and feels the pressure is adequate.  He continues tofeel rested in the am but occasionally will have some daytime sleepiness after eating. He continues to have no snoring.He has no significant mouth dryness but does have some nasal congestion.   Past Medical History:  Diagnosis Date  . Gout   . Hyperlipidemia   . Hypertension 8/Bradford/2015  . Hypertrophic cardiomyopathy (HCC) 8/Bradford/2015   a. s/p MDT dual chamber ICD implant 03/2014 followed by Dr Graciela Husbands  . OSA (obstructive sleep apnea) 10/28/2014   Severe with AHI 78/hr now on CPAP  . Stroke (HCC) 09/22/2013   a. non-hemorrhagic b. residual left sided weakness    Past Surgical History:  Procedure Laterality Date  . IMPLANTABLE CARDIOVERTER DEFIBRILLATOR IMPLANT N/A 04/06/2014   MDT MRI compatible dual chamber ICD implanted by Dr Graciela Husbands    Current Medications: No outpatient prescriptions have been marked as taking for the 07/16/16 encounter (Office Visit) with Marc Reichert, MD.    Allergies:   Patient has no known allergies.   Social History   Social History  . Marital status: Married    Spouse name: N/A  . Number of children: N/A  . Years of education: N/A   Social History Main Topics  . Smoking status: Never Smoker  . Smokeless tobacco: Current User    Types: Snuff  . Alcohol use 1.2 oz/week    2 Cans of beer per week  . Drug use: No  . Sexual activity: Yes   Other Topics Concern  . Not on file   Social History Narrative  . No  narrative on file     Family History:  The patient's family history includes Diabetes in his other; Drug abuse in his brother; Heart attack in his mother; Hypertension in his mother; Sudden death in his father and mother.   ROS:   Please see the history of present illness.    ROS All other systems reviewed and are negative.  No flowsheet data found.     PHYSICAL EXAM:   VS:  There were no vitals taken for this visit.   GEN: Well nourished, well developed, in no acute distress  HEENT: normal  Neck: no JVD, carotid bruits, or masses Cardiac: RRR; no murmurs, rubs, or gallops,no edema.  Intact distal pulses bilaterally.  Respiratory:  clear to auscultation bilaterally, normal work of breathing GI: soft, nontender, nondistended, + BS MS: no deformity or atrophy  Skin: warm and dry, no rash Neuro:  Alert and Oriented x 3, Strength and sensation are intact Psych: euthymic mood, full affect  Wt Readings from Last 3 Encounters:  04/16/16 245 lb 12.8 oz (111.5 kg)  12/08/15 238 lb (108 kg)  12/06/15 238 lb 12.8 oz (108.3 kg)      Studies/Labs Reviewed:   EKG:  EKG is not ordered today.    Recent Labs: 12/08/2015: ALT 28; BUN 11; Creatinine, Ser 0.80; Hemoglobin Bradford.4; Platelets 187; Potassium  3.4; Sodium 139   Lipid Panel    Component Value Date/Time   CHOL 180 09/29/2013 0238   CHOL 120 05/24/2011 0444   TRIG 382 (H) 09/29/2013 0238   TRIG 288 (H) 05/24/2011 0444   HDL 35 (L) 09/29/2013 0238   HDL 28 (L) 05/24/2011 0444   CHOLHDL 5.1 09/29/2013 0238   VLDL 76 (H) 09/29/2013 0238   VLDL 58 (H) 05/24/2011 0444   LDLCALC 69 09/29/2013 0238   LDLCALC 34 05/24/2011 0444    Additional studies/ records that were reviewed today include:  CPAP download    ASSESSMENT:    1. OSA (obstructive sleep apnea)   2. Essential hypertension   3. Morbid obesity (HCC)      PLAN:  In order of problems listed above:  OSA - the patient is tolerating PAP therapy well without any  problems. The PAP download was reviewed today and showed an AHI of ***/hr on *** cm H2O with ***% compliance in using more than 4 hours nightly.  The patient has been using and benefiting from CPAP use and will continue to benefit from therapy.   2.   HTN - His BP is adequately controlled on exam today.  He will continue on amlodipine 5mg  daily, INderal 80mg  daily and accupril 40mg  daily.    3.   Morbid obesity - I have encouraged him to get into a routine exercise program and cut back on carbs and portions.      Medication Adjustments/Labs and Tests Ordered: Current medicines are reviewed at length with the patient today.  Concerns regarding medicines are outlined above.  Medication changes, Labs and Tests ordered today are listed in the Patient Instructions below.  There are no Patient Instructions on file for this visit.   Signed, Marc Magicraci Turner, MD  07/15/2016 7:50 PM    [facility] [address], [place], [place]  [address] Phone: [phone]; Fax: [phone]

## 2016-07-16 ENCOUNTER — Encounter: Payer: Medicare Other | Admitting: Cardiology

## 2016-07-17 ENCOUNTER — Telehealth: Payer: Self-pay | Admitting: *Deleted

## 2016-07-17 NOTE — Progress Notes (Signed)
This encounter was created in error - please disregard.

## 2016-07-17 NOTE — Telephone Encounter (Signed)
Received [facility] from [place] [employer], forwarded to [place] for email/scan/SLS 05/30

## 2016-07-25 ENCOUNTER — Telehealth: Payer: Self-pay | Admitting: *Deleted

## 2016-07-25 NOTE — Telephone Encounter (Signed)
-----   Message from Quintella Reichertraci R Turner, MD sent at 07/24/2016 12:22 PM EDT ----- Good AHI and compliance.  Continue current CPAP settings.

## 2016-07-25 NOTE — Telephone Encounter (Signed)
Informed patient of compliance results and patient understanding was verbalized. Patient understands his settings will not change. 

## 2016-07-31 ENCOUNTER — Ambulatory Visit (INDEPENDENT_AMBULATORY_CARE_PROVIDER_SITE_OTHER): Payer: Medicare Other | Admitting: Internal Medicine

## 2016-07-31 ENCOUNTER — Encounter: Payer: Self-pay | Admitting: Internal Medicine

## 2016-07-31 VITALS — BP 126/70 | HR 76 | Ht 66.0 in | Wt 241.0 lb

## 2016-07-31 DIAGNOSIS — I5030 Unspecified diastolic (congestive) heart failure: Secondary | ICD-10-CM

## 2016-07-31 DIAGNOSIS — I422 Other hypertrophic cardiomyopathy: Secondary | ICD-10-CM

## 2016-07-31 DIAGNOSIS — I10 Essential (primary) hypertension: Secondary | ICD-10-CM | POA: Diagnosis not present

## 2016-07-31 DIAGNOSIS — Z95810 Presence of automatic (implantable) cardiac defibrillator: Secondary | ICD-10-CM | POA: Diagnosis not present

## 2016-07-31 NOTE — Patient Instructions (Addendum)
Medication Instructions: - Your physician recommends that you continue on your current medications as directed. Please refer to the Current Medication list given to you today.   Labwork: - Your physician recommends that you have lab work today: [employer]   Procedures/Testing: - none ordered  Follow-Up: - Remote monitoring is used to monitor your Pacemaker of ICD from home. This monitoring reduces the number of office visits required to check your device to one time per year. It allows us to keep an eye on the functioning of your device to ensure it is working properly. You are scheduled for a device check from home on 10/30/16. You may send your transmission at any time that day. If you have a wireless device, the transmission will be sent automatically. After your physician reviews your transmission, you will receive a postcard with your next transmission date.  - Your physician wants you to follow-up in: 1 year with Dr. [place]. You will receive a reminder letter in the mail two months in advance. If you don't receive a letter, please call our office to schedule the follow-up appointment.   Any Additional Special Instructions Will Be Listed Below (If Applicable).     If you need a refill on your cardiac medications before your next appointment, please call your pharmacy.

## 2016-07-31 NOTE — Progress Notes (Signed)
Patient Care Team: [facility] as PCP - General (General Practice)   HPI  Marc Bradford is a 55 y.o. male Seen in follow-up for apical hypertrophic cardiomyopathy. MRI demonstrates significant gadolinium enhancement. He had nonsustained ventricular tachycardia and underwent primary prevention ICD. He has had problems with shortness of breath HFpEF. This is unchanged  He is much better with CPAP  Intermittent edema  No chest pain   No ICD shocks  He has a history of a prior stroke with residual weakness as well as hypertension   Records and Results Reviewed  Interval office notes  Date Cr K  10/17 0.8 3.4          Past Medical History:  Diagnosis Date  . Gout   . Hyperlipidemia   . Hypertension 09/30/2013  . 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 s/p MDT dual chamber ICD implant 03/2014 followed by Dr Graciela Husbands  . OSA (obstructive sleep apnea) 10/28/2014   Severe with AHI 78/hr now on CPAP  . Stroke (HCC) 09/22/2013   a. non-hemorrhagic b. residual left sided weakness    Past Surgical History:  Procedure Laterality Date  . IMPLANTABLE CARDIOVERTER DEFIBRILLATOR IMPLANT N/A 04/06/2014   MDT MRI compatible dual chamber ICD implanted by Dr Graciela Husbands    Current Outpatient Prescriptions  Medication Sig Dispense Refill  . amLODipine (NORVASC) 5 MG tablet Take 5 mg by mouth daily.    Marland Kitchen aspirin EC 81 MG tablet Take 81 mg by mouth daily.    . Cholecalciferol (VITAMIN D PO) Take 1 tablet by mouth daily.    . furosemide (LASIX) 20 MG tablet Take 1 tablet (20 mg total) by mouth daily. 30 tablet 3  . HYDROcodone-acetaminophen (NORCO/VICODIN) 5-325 MG per tablet Take 1 tablet by mouth every 6 (six) hours as needed for moderate pain or severe pain. 10 tablet 0  . HYDROcodone-homatropine (HYCODAN) 5-1.5 MG/5ML syrup Take 5 mLs by mouth every 6 (six) hours as needed for cough. 120 mL 0  . hydrOXYzine (ATARAX/VISTARIL) 50 MG tablet Take 1 tablet (50 mg total)  by mouth 3 (three) times daily as needed. 30 tablet 0  . propranolol ER (INDERAL LA) 80 MG 24 hr capsule Take 1 capsule (80 mg total) by mouth daily. 30 capsule 5  . quinapril (ACCUPRIL) 40 MG tablet Take 40 mg by mouth daily.    . simvastatin (ZOCOR) 20 MG tablet Take 20 mg by mouth at bedtime.     No current facility-administered medications for this visit.     No Known Allergies    Review of Systems negative except from HPI and PMH  Physical Exam BP 126/70   Pulse 76   Ht 5\' 6"  (1.676 m)   Wt 241 lb (109.3 kg)   SpO2 98%   BMI 38.90 kg/m  Well developed and well nourished in no acute distress HENT normal E scleral and icterus clear Neck Supple JVP8;   Clear to ausculation Device pocket well healed; without hematoma or erythema.  There is no tethering  Regular rate and rhythm, no murmurs gallops or rub Soft with active bowel sounds No clubbing cyanosis Trace Edema Alert and oriented, grossly normal motor and sensory function Skin Warm and Dry  Sinus at 63 Intervals 22/11/45 LVH with repolarization abnormalities   Assessment and  Plan  HCM-atypical variant  VT nonsustained   HFpEF  Morbidly obese  Hypertension  He is euvolemic. We will continue him on his low-dose diuretics.  His last potassium level was low and we will recheck it today.  Encourage weight loss.  Blood pressure is well-controlled. He has a history of edema which may be related to amlodipine; it is not evident today. We elected to continue him on his current antihypertensive regime.  No intercurrent Ventricular tachycardia  No intercurrent Ventricular tachycardia       Current medicines are reviewed at length with the patient today .  The patient does not  have concerns regarding medicines.

## 2016-08-01 LAB — BASIC METABOLIC PANEL
BUN / CREAT RATIO: 10 (ref 9–20)
BUN: 11 mg/dL (ref 6–24)
CHLORIDE: 103 mmol/L (ref 96–106)
CO2: 25 mmol/L (ref 20–29)
Calcium: 9.4 mg/dL (ref 8.7–10.2)
Creatinine, Ser: 1.15 mg/dL (ref 0.76–1.27)
GFR calc non Af Amer: 71 mL/min/{1.73_m2} (ref >59)
GFR, EST AFRICAN AMERICAN: 82 mL/min/{1.73_m2} (ref >59)
GLUCOSE: 100 mg/dL — AB (ref 65–99)
POTASSIUM: 3.9 mmol/L (ref 3.5–5.2)
SODIUM: 143 mmol/L (ref 134–144)

## 2016-08-04 ENCOUNTER — Encounter: Payer: Self-pay | Admitting: Cardiology

## 2016-08-08 ENCOUNTER — Ambulatory Visit (INDEPENDENT_AMBULATORY_CARE_PROVIDER_SITE_OTHER): Payer: Medicare Other | Admitting: Cardiology

## 2016-08-08 ENCOUNTER — Encounter: Payer: Self-pay | Admitting: Cardiology

## 2016-08-08 VITALS — BP 148/84 | HR 60 | Ht 66.0 in | Wt 241.0 lb

## 2016-08-08 DIAGNOSIS — G4733 Obstructive sleep apnea (adult) (pediatric): Secondary | ICD-10-CM

## 2016-08-08 DIAGNOSIS — I10 Essential (primary) hypertension: Secondary | ICD-10-CM

## 2016-08-08 NOTE — Progress Notes (Signed)
Cardiology Office Note    Date:  08/08/2016   ID:  Marc Bradford, Marc Bradford 04-06-1960, MRN [id number]  PCP:  [facility]  Cardiologist:  Berton Mount, MD   Chief Complaint  Patient presents with  . Sleep Apnea  . Hypertension    History of Present Illness:  Marc Bradford is a 56 y.o. male with a history of severe OSA with an AHI of 77.8/hr and is onCPAP at11cm H2O. He is here today for followup and is doing well with his CPAP. He tolerates the nasal mask and feels the pressure is adequate. He continues tofeel more rested in the am but he will occasionally  have some daytime sleepiness after eating and will nap. His snoring has resolved. He has no mouth dryness.  His nasal congestion has improved with nasal spray.    Past Medical History:  Diagnosis Date  . Gout   . Hyperlipidemia   . Hypertension 09/30/2013  . 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 s/p MDT dual chamber ICD implant 03/2014 followed by Dr Graciela Husbands  . OSA (obstructive sleep apnea) 10/28/2014   Severe with AHI 78/hr now on CPAP  . Stroke (HCC) 09/22/2013   a. non-hemorrhagic b. residual left sided weakness    Past Surgical History:  Procedure Laterality Date  . IMPLANTABLE CARDIOVERTER DEFIBRILLATOR IMPLANT N/A 04/06/2014   MDT MRI compatible dual chamber ICD implanted by Dr Graciela Husbands    Current Medications: Current Meds  Medication Sig  . amLODipine (NORVASC) 5 MG tablet Take 5 mg by mouth daily.  Marland Kitchen aspirin EC 81 MG tablet Take 81 mg by mouth daily.  . Cholecalciferol (VITAMIN D PO) Take 1 tablet by mouth daily.  . furosemide (LASIX) 20 MG tablet Take 1 tablet (20 mg total) by mouth daily.  Marland Kitchen HYDROcodone-acetaminophen (NORCO/VICODIN) 5-325 MG per tablet Take 1 tablet by mouth every 6 (six) hours as needed for moderate pain or severe pain.  Marland Kitchen HYDROcodone-homatropine (HYCODAN) 5-1.5 MG/5ML syrup Take 5 mLs by mouth every 6 (six) hours as needed for cough.  . hydrOXYzine (ATARAX/VISTARIL)  50 MG tablet Take 1 tablet (50 mg total) by mouth 3 (three) times daily as needed.  . propranolol ER (INDERAL LA) 80 MG 24 hr capsule Take 1 capsule (80 mg total) by mouth daily.  . quinapril (ACCUPRIL) 40 MG tablet Take 40 mg by mouth daily.  . simvastatin (ZOCOR) 20 MG tablet Take 20 mg by mouth at bedtime.    Allergies:   Patient has no known allergies.   Social History   Social History  . Marital status: Married    Spouse name: N/A  . Number of children: N/A  . Years of education: N/A   Social History Main Topics  . Smoking status: Never Smoker  . Smokeless tobacco: Current User    Types: Snuff  . Alcohol use 1.2 oz/week    2 Cans of beer per week  . Drug use: No  . Sexual activity: Yes   Other Topics Concern  . None   Social History Narrative  . None     Family History:  The patient's family history includes Diabetes in his other; Drug abuse in his brother; Heart attack in his mother; Hypertension in his mother; Sudden death in his father and mother.   ROS:   Please see the history of present illness.    ROS All other systems reviewed and are negative.  No flowsheet data found.     PHYSICAL EXAM:  VS:  BP (!) 148/84   Pulse 60   Ht 5\' 6"  (1.676 m)   Wt 241 lb (109.3 kg)   BMI 38.90 kg/m    GEN: Well nourished, well developed, in no acute distress  HEENT: normal  Neck: no JVD, carotid bruits, or masses Cardiac: RRR; no murmurs, rubs, or gallops,no edema.  Intact distal pulses bilaterally.  Respiratory:  clear to auscultation bilaterally, normal work of breathing GI: soft, nontender, nondistended, + BS MS: no deformity or atrophy  Skin: warm and dry, no rash Neuro:  Alert and Oriented x 3, Strength and sensation are intact Psych: euthymic mood, full affect  Wt Readings from Last 3 Encounters:  08/08/16 241 lb (109.3 kg)  07/31/16 241 lb (109.3 kg)  04/16/16 245 lb 12.8 oz (111.5 kg)      Studies/Labs Reviewed:   EKG:  EKG is not ordered today.     Recent Labs: 12/08/2015: ALT 28; Hemoglobin 13.4; Platelets 187 07/31/2016: BUN 11; Creatinine, Ser 1.15; Potassium 3.9; Sodium 143   Lipid Panel    Component Value Date/Time   CHOL 180 09/29/2013 0238   CHOL 120 05/24/2011 0444   TRIG 382 (H) 09/29/2013 0238   TRIG 288 (H) 05/24/2011 0444   HDL 35 (L) 09/29/2013 0238   HDL 28 (L) 05/24/2011 0444   CHOLHDL 5.1 09/29/2013 0238   VLDL 76 (H) 09/29/2013 0238   VLDL 58 (H) 05/24/2011 0444   LDLCALC 69 09/29/2013 0238   LDLCALC 34 05/24/2011 0444    Additional studies/ records that were reviewed today include:  CPAP download     ASSESSMENT:    1. OSA (obstructive sleep apnea)   2. Essential hypertension   3. Morbid obesity (HCC)      PLAN:  In order of problems listed above:  OSA - the patient is tolerating PAP therapy well without any problems. The PAP download was reviewed today and showed an AHI of 0.7/hr on 11 cm H2O with 100% compliance in using more than 4 hours nightly.  The patient has been using and benefiting from CPAP use and will continue to benefit from therapy.  HTN - His BP is borderline controlled on exam today but took his BP meds around 8am.  He will continue on amlodipine 5mg  daily, quinapril 40mg  daily and Inderal 80mg  daily.  Morbid obesity - I have encouraged him to continue in his exercise program and cut back on carbs and portions.      Medication Adjustments/Labs and Tests Ordered: Current medicines are reviewed at length with the patient today.  Concerns regarding medicines are outlined above.  Medication changes, Labs and Tests ordered today are listed in the Patient Instructions below.  There are no Patient Instructions on file for this visit.   Signed, Armanda Magicraci Shaune Malacara, MD  08/08/2016 10:19 AM    [facility] [address], [place], [place]  [address] Phone: [phone]; Fax: [phone]

## 2016-08-08 NOTE — Patient Instructions (Signed)

## 2016-08-13 LAB — CUP PACEART INCLINIC DEVICE CHECK
Battery Remaining Longevity: 105 mo
Battery Voltage: 3.01 V
Brady Statistic AP VP Percent: 0.06 %
Brady Statistic AP VS Percent: 39.77 %
Brady Statistic RV Percent Paced: 0.09 %
HIGH POWER IMPEDANCE MEASURED VALUE: 64 Ohm
Implantable Lead Implant Date: 20160217
Implantable Lead Implant Date: 20160217
Implantable Lead Location: 753859
Implantable Lead Location: 753860
Implantable Lead Model: 5076
Implantable Pulse Generator Implant Date: 20160217
Lead Channel Impedance Value: 361 Ohm
Lead Channel Impedance Value: 456 Ohm
Lead Channel Impedance Value: 475 Ohm
Lead Channel Pacing Threshold Amplitude: 0.5 V
Lead Channel Pacing Threshold Amplitude: 0.75 V
Lead Channel Pacing Threshold Pulse Width: 0.4 ms
Lead Channel Sensing Intrinsic Amplitude: 3.375 mV
Lead Channel Setting Pacing Amplitude: 2 V
MDC IDC MSMT LEADCHNL RV PACING THRESHOLD PULSEWIDTH: 0.4 ms
MDC IDC MSMT LEADCHNL RV SENSING INTR AMPL: 31.625 mV
MDC IDC SESS DTM: 20180613174951
MDC IDC SET LEADCHNL RV PACING AMPLITUDE: 2.5 V
MDC IDC SET LEADCHNL RV PACING PULSEWIDTH: 0.4 ms
MDC IDC SET LEADCHNL RV SENSING SENSITIVITY: 0.3 mV
MDC IDC STAT BRADY AS VP PERCENT: 0.02 %
MDC IDC STAT BRADY AS VS PERCENT: 60.15 %
MDC IDC STAT BRADY RA PERCENT PACED: 39.79 %

## 2016-10-30 ENCOUNTER — Ambulatory Visit (INDEPENDENT_AMBULATORY_CARE_PROVIDER_SITE_OTHER): Payer: Medicare Other | Admitting: *Deleted

## 2016-10-30 DIAGNOSIS — I422 Other hypertrophic cardiomyopathy: Secondary | ICD-10-CM

## 2016-10-30 DIAGNOSIS — Z95810 Presence of automatic (implantable) cardiac defibrillator: Secondary | ICD-10-CM

## 2016-10-30 NOTE — Progress Notes (Signed)
Remote ICD transmission.   

## 2016-10-31 ENCOUNTER — Emergency Department
Admission: EM | Admit: 2016-10-31 | Discharge: 2016-10-31 | Disposition: A | Discharge status: Home / Self Care | Payer: Medicare Other | Attending: Emergency Medicine | Admitting: Emergency Medicine

## 2016-10-31 DIAGNOSIS — Z79899 Other long term (current) drug therapy: Secondary | ICD-10-CM | POA: Diagnosis not present

## 2016-10-31 DIAGNOSIS — Z8673 Personal history of transient ischemic attack (TIA), and cerebral infarction without residual deficits: Secondary | ICD-10-CM | POA: Insufficient documentation

## 2016-10-31 DIAGNOSIS — Z95810 Presence of automatic (implantable) cardiac defibrillator: Secondary | ICD-10-CM | POA: Insufficient documentation

## 2016-10-31 DIAGNOSIS — K047 Periapical abscess without sinus: Secondary | ICD-10-CM | POA: Diagnosis not present

## 2016-10-31 DIAGNOSIS — Z7982 Long term (current) use of aspirin: Secondary | ICD-10-CM | POA: Diagnosis not present

## 2016-10-31 DIAGNOSIS — I10 Essential (primary) hypertension: Secondary | ICD-10-CM | POA: Diagnosis not present

## 2016-10-31 DIAGNOSIS — R220 Localized swelling, mass and lump, head: Secondary | ICD-10-CM

## 2016-10-31 DIAGNOSIS — F17228 Nicotine dependence, chewing tobacco, with other nicotine-induced disorders: Secondary | ICD-10-CM | POA: Insufficient documentation

## 2016-10-31 DIAGNOSIS — R600 Localized edema: Secondary | ICD-10-CM | POA: Insufficient documentation

## 2016-10-31 DIAGNOSIS — K0889 Other specified disorders of teeth and supporting structures: Secondary | ICD-10-CM | POA: Diagnosis present

## 2016-10-31 LAB — CUP PACEART REMOTE DEVICE CHECK
Battery Voltage: 3 V
Brady Statistic AP VP Percent: 0.05 %
Brady Statistic AS VP Percent: 0.03 %
Brady Statistic RA Percent Paced: 39.95 %
Brady Statistic RV Percent Paced: 0.08 %
Date Time Interrogation Session: 20180912052204
HIGH POWER IMPEDANCE MEASURED VALUE: 61 Ohm
Implantable Lead Implant Date: 20160217
Implantable Lead Location: 753860
Implantable Lead Model: 5076
Implantable Pulse Generator Implant Date: 20160217
Lead Channel Impedance Value: 342 Ohm
Lead Channel Impedance Value: 418 Ohm
Lead Channel Pacing Threshold Pulse Width: 0.4 ms
Lead Channel Sensing Intrinsic Amplitude: 31.625 mV
Lead Channel Sensing Intrinsic Amplitude: 31.625 mV
Lead Channel Setting Pacing Amplitude: 2.5 V
Lead Channel Setting Sensing Sensitivity: 0.3 mV
MDC IDC LEAD IMPLANT DT: 20160217
MDC IDC LEAD LOCATION: 753859
MDC IDC MSMT BATTERY REMAINING LONGEVITY: 101 mo
MDC IDC MSMT LEADCHNL RA IMPEDANCE VALUE: 456 Ohm
MDC IDC MSMT LEADCHNL RA PACING THRESHOLD AMPLITUDE: 0.5 V
MDC IDC MSMT LEADCHNL RA SENSING INTR AMPL: 3.5 mV
MDC IDC MSMT LEADCHNL RA SENSING INTR AMPL: 3.5 mV
MDC IDC MSMT LEADCHNL RV PACING THRESHOLD AMPLITUDE: 0.625 V
MDC IDC MSMT LEADCHNL RV PACING THRESHOLD PULSEWIDTH: 0.4 ms
MDC IDC SET LEADCHNL RA PACING AMPLITUDE: 2 V
MDC IDC SET LEADCHNL RV PACING PULSEWIDTH: 0.4 ms
MDC IDC STAT BRADY AP VS PERCENT: 39.95 %
MDC IDC STAT BRADY AS VS PERCENT: 59.98 %

## 2016-10-31 MED ORDER — DIPHENHYDRAMINE HCL 25 MG PO CAPS: 25.0000 mg | ORAL_CAPSULE | ORAL | 0 refills | Status: DC | PRN

## 2016-10-31 MED ORDER — DIPHENHYDRAMINE HCL 50 MG/ML IJ SOLN
25.0000 mg | Freq: Once | INTRAMUSCULAR | Status: AC
  Administered 2016-10-31: 25 mg via INTRAVENOUS
  Filled 2016-10-31: qty 1

## 2016-10-31 MED ORDER — DEXAMETHASONE SODIUM PHOSPHATE 10 MG/ML IJ SOLN
10.0000 mg | Freq: Once | INTRAMUSCULAR | Status: AC
  Administered 2016-10-31: 10 mg via INTRAVENOUS
  Filled 2016-10-31: qty 1

## 2016-10-31 MED ORDER — CLINDAMYCIN HCL 300 MG PO CAPS: 300.0000 mg | ORAL_CAPSULE | Freq: Four times a day (QID) | ORAL | 0 refills | Status: AC

## 2016-10-31 MED ORDER — KETOROLAC TROMETHAMINE 30 MG/ML IJ SOLN
30.0000 mg | Freq: Once | INTRAMUSCULAR | Status: AC
  Administered 2016-10-31: 30 mg via INTRAVENOUS
  Filled 2016-10-31: qty 1

## 2016-10-31 MED ORDER — CLINDAMYCIN PHOSPHATE 600 MG/50ML IV SOLN
600.0000 mg | Freq: Once | INTRAVENOUS | Status: AC
  Administered 2016-10-31: 600 mg via INTRAVENOUS
  Filled 2016-10-31: qty 50

## 2016-10-31 NOTE — ED Notes (Signed)
See triage note developed a possible toothache to left side for the past 3 days  Possible fever at home   Afebrile on arrival has swelling noted to left side of face with increased pain

## 2016-10-31 NOTE — ED Provider Notes (Signed)
[facility] Emergency Department Provider Note  ____________________________________________  Time seen: Approximately 10:34 AM  I have reviewed the triage vital signs and the nursing notes.   HISTORY  Chief Complaint Dental Pain and Facial Swelling    HPI Marc Bradford is a 56 y.o. male that presents to the emergency department for evaluation of left sided facial pain and swelling for three days. Pain is over upper left teeth. He is not having difficulty opening and closing mouth. He has not seen a dentist in a long time due to financial reasons. This happened once to him previously and he used OTC mouthwash and symptoms improved. No fever, shortness breath, chest pain, nausea, vomiting, abdominal pain.   Past Medical History:  Diagnosis Date  . Gout   . Hyperlipidemia   . Hypertension 09/30/2013  . 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 s/p MDT dual chamber ICD implant 03/2014 followed by Dr Graciela Husbands  . OSA (obstructive sleep apnea) 10/28/2014   Severe with AHI 78/hr now on CPAP  . Stroke (HCC) 09/22/2013   a. non-hemorrhagic b. residual left sided weakness    Patient Active Problem List   Diagnosis Date Noted  . OSA (obstructive sleep apnea) 10/28/2014  . Morbid obesity (HCC) 10/28/2014  . (HFpEF) heart failure with preserved ejection fraction (HCC) 04/06/2014  . 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 Essential hypertension 09/30/2013  . Hyperlipidemia 09/30/2013  . Left-sided weakness 09/28/2013    Past Surgical History:  Procedure Laterality Date  . IMPLANTABLE CARDIOVERTER DEFIBRILLATOR IMPLANT N/A 04/06/2014   MDT MRI compatible dual chamber ICD implanted by Dr Graciela Husbands    Prior to Admission medications   Medication Sig Start Date End Date Taking? Authorizing Provider  amLODipine (NORVASC) 5 MG tablet Take 5 mg by mouth daily.    [provider]  aspirin EC 81 MG tablet Take 81 mg by mouth daily.     [provider]  Cholecalciferol (VITAMIN D PO) Take 1 tablet by mouth daily.    [provider]  clindamycin (CLEOCIN) 300 MG capsule Take 1 capsule (300 mg total) by mouth 4 (four) times daily. 10/31/16 11/10/16  Enid Derry, PA-C  diphenhydrAMINE (BENADRYL) 25 mg capsule Take 1 capsule (25 mg total) by mouth every 4 (four) hours as needed. 10/31/16 10/31/17  Enid Derry, PA-C  furosemide (LASIX) 20 MG tablet Take 1 tablet (20 mg total) by mouth daily. 07/22/14   Duke Salvia, MD  HYDROcodone-acetaminophen (NORCO/VICODIN) 5-325 MG per tablet Take 1 tablet by mouth every 6 (six) hours as needed for moderate pain or severe pain. 04/07/14   Azalee Course, PA  HYDROcodone-homatropine [facility]) 5-1.5 MG/5ML syrup Take 5 mLs by mouth every 6 (six) hours as needed for cough. 02/23/15   Ignacia Bayley, PA-C  hydrOXYzine (ATARAX/VISTARIL) 50 MG tablet Take 1 tablet (50 mg total) by mouth 3 (three) times daily as needed. 08/16/14   Joni Reining, PA-C  propranolol ER (INDERAL LA) 80 MG 24 hr capsule Take 1 capsule (80 mg total) by mouth daily. 04/07/14   Azalee Course, PA  quinapril (ACCUPRIL) 40 MG tablet Take 40 mg by mouth daily.    [provider]  simvastatin (ZOCOR) 20 MG tablet Take 20 mg by mouth at bedtime.    [provider]    Allergies Patient has no known allergies.  Family History  Problem Relation Age of Onset  . Heart attack Mother   . Sudden death Mother   . Hypertension  Mother   . Sudden death Father   . Diabetes Other        family history  . Drug abuse Brother   . Anemia Neg Hx   . Arrhythmia Neg Hx   . Asthma Neg Hx   . Cancer Neg Hx   . Depression Neg Hx   . Heart failure Neg Hx   . Hyperlipidemia Neg Hx   . Kidney disease Neg Hx   . Thyroid disease Neg Hx   . Fainting Neg Hx   . Stroke Neg Hx     Social History Social History  Substance Use Topics  . Smoking status: Never Smoker  . Smokeless tobacco: Current User    Types: Snuff  .  Alcohol use 1.2 oz/week    2 Cans of beer per week     Review of Systems  Constitutional: No fever/chills Cardiovascular: No chest pain. Respiratory: No SOB. Gastrointestinal: No abdominal pain.  No nausea, no vomiting.  Skin: Negative for rash, abrasions, lacerations, ecchymosis. Neurological: Negative for headaches, numbness or tingling   ____________________________________________   PHYSICAL EXAM:  VITAL SIGNS: ED Triage Vitals  Enc Vitals Group     BP 10/31/16 0959 128/88     Pulse Rate 10/31/16 0959 68     Resp 10/31/16 0959 18     Temp 10/31/16 0959 98.7 F (37.1 C)     Temp Source 10/31/16 0959 Oral     SpO2 10/31/16 0959 100 %     Weight 10/31/16 1000 237 lb (107.5 kg)     Height 10/31/16 1000 5\' 6"  (1.676 m)     Head Circumference --      Peak Flow --      Pain Score 10/31/16 0959 10     Pain Loc --      Pain Edu? --      Excl. in GC? --      Constitutional: Alert and oriented. Well appearing and in no acute distress. Eyes: Conjunctivae are normal. PERRL. EOMI. Head: Atraumatic. ENT:      Ears:      Nose: No congestion/rhinnorhea.      Mouth/Throat: Mucous membranes are moist. Moderate swelling to left cheek. Tenderness to palpation over left upper teeth. No drainage from mouth. No swelling or tenderness under chin. Neck: No stridor.  Cardiovascular: Normal rate, regular rhythm.  Good peripheral circulation. Respiratory: Normal respiratory effort without tachypnea or retractions. Lungs CTAB. Good air entry to the bases with no decreased or absent breath sounds. Musculoskeletal: Full range of motion to all extremities. No gross deformities appreciated. Neurologic:  Normal speech and language. No gross focal neurologic deficits are appreciated.  Skin:  Skin is warm, dry and intact. No rash noted.   ____________________________________________   LABS (all labs ordered are listed, but only abnormal results are displayed)  Labs Reviewed - No data to  display ____________________________________________  EKG   ____________________________________________  RADIOLOGY  No results found.  ____________________________________________    PROCEDURES  Procedure(s) performed:    Procedures    Medications  clindamycin (CLEOCIN) IVPB 600 mg (0 mg Intravenous Stopped 10/31/16 1222)  diphenhydrAMINE (BENADRYL) injection 25 mg (25 mg Intravenous Given 10/31/16 1114)  ketorolac (TORADOL) 30 MG/ML injection 30 mg (30 mg Intravenous Given 10/31/16 1114)  dexamethasone (DECADRON) injection 10 mg (10 mg Intravenous Given 10/31/16 1114)     ____________________________________________   INITIAL IMPRESSION / ASSESSMENT AND PLAN / ED COURSE  Pertinent labs & imaging results that were available during my  care of the patient were reviewed by me and considered in my medical decision making (see chart for details).  Review of the [place] CSRS was performed in accordance of the [facility] prior to dispensing any controlled drugs.    Patient's diagnosis is consistent with dental abscess. Vital signs and exam are reassuring. Patient is afebrile and appears well. He was given clindamycin, Decadron, Benadryl, Toradol in ED. He was instructed to follow-up with a dentist as soon as possible. Patient will be discharged home with prescriptions for clindamycin. Patient is to follow up with dentist as directed. Patient is given ED precautions to return to the ED for any worsening or new symptoms.     ____________________________________________  FINAL CLINICAL IMPRESSION(S) / ED DIAGNOSES  Final diagnoses:  Dental abscess  Facial swelling      NEW MEDICATIONS STARTED DURING THIS VISIT:  Discharge Medication List as of 10/31/2016 12:14 PM    START taking these medications   Details  clindamycin (CLEOCIN) 300 MG capsule Take 1 capsule (300 mg total) by mouth 4 (four) times daily., Starting Thu 10/31/2016, Until Sun 11/10/2016, Print    diphenhydrAMINE  (BENADRYL) 25 mg capsule Take 1 capsule (25 mg total) by mouth every 4 (four) hours as needed., Starting Thu 10/31/2016, Until Fri 10/31/2017, Print            This chart was dictated using voice recognition software/Dragon. Despite best efforts to proofread, errors can occur which can change the meaning. Any change was purely unintentional.    Enid Derry, PA-C 10/31/16 1755    Minna Antis, MD 11/03/16 [phone]

## 2016-10-31 NOTE — ED Triage Notes (Signed)
Left sided dental pain and swelling X 3 days.

## 2016-10-31 NOTE — Discharge Instructions (Signed)
OPTIONS FOR DENTAL FOLLOW UP CARE ° °[place] Department of Health and Human Services - Local Safety Net Dental Clinics °http://www.ncdhhs.gov/dph/oralhealth/services/safetynetclinics.htm °  °[facility] (336-562-3123) ° °[facility] [place] ([phone]) ° °[facility] ([phone] ext [phone]) ° °[facility] ([phone]) ° °[facility] ([phone]) °This clinic caters to the indigent population and is on a lottery system. °Location: °[facility], [address], [place] °Clinic Hours: °Wednesdays from 6pm - 9pm, patients seen by a lottery system. °For dates, call or go to www.med.[facility].edu/[facility]/patients/Dental-[facility] °Services: °Cleanings, fillings and simple extractions. °Payment Options: °DENTAL WORK IS FREE OF CHARGE. Bring proof of income or support. °Best way to get seen: °Arrive at 5:15 pm - this is a lottery, NOT first come/first serve, so arriving earlier will not increase your chances of being seen. °  °  °[facility] °[phone] °Select option 1 for emergencies °  °Location: °[facility], [address], [place] °Clinic Hours: °No walk-ins accepted - call the day before to schedule an appointment. °Check in times are 9:30 am and 1:30 pm. °Services: °Simple extractions, temporary fillings, pulpectomy/pulp debridement, uncomplicated abscess drainage. °Payment Options: °PAYMENT IS DUE AT THE TIME OF SERVICE.  Fee is usually $100-200, additional surgical procedures (e.g. abscess drainage) may be extra. °Cash, checks, Visa/MasterCard accepted.  Can file Medicaid if patient is covered for dental - patient should call case worker to check. °No discount for [facility] patients. °Best way to get seen: °MUST call the day before and get onto the schedule. Can usually be seen the next 1-2 days. No walk-ins accepted. °  °  °[facility] °[phone] °   °Location: °[facility], [address], [place] °Clinic Hours: °M, W, Th, F 8am or 1:30pm, Tues 9a or 1:30 - first come/first served. °Services: °Simple extractions, temporary fillings, uncomplicated abscess drainage.  You do not need to be an [place] resident. °Payment Options: °PAYMENT IS DUE AT THE TIME OF SERVICE. °Dental insurance, otherwise sliding scale - bring proof of income or support. °Depending on income and treatment needed, cost is usually $50-200. °Best way to get seen: °Arrive early as it is first come/first served. °  °  °[facility] °[phone] °  °Location: °[address] °Clinic Hours: °Mon-Thu 8a-5p °Services: °Most basic dental services including extractions and fillings. °Payment Options: °PAYMENT IS DUE AT THE TIME OF SERVICE. °Sliding scale, up to 50% off - bring proof if income or support. °Medicaid with dental option accepted. °Best way to get seen: °Call to schedule an appointment, can usually be seen within 2 weeks OR they will try to see walk-ins - show up at 8a or 2p (you may have to wait). °  °  °[facility] °[phone] °[place] RESIDENTS ONLY °  °Location: °[facility], [address], [place],  [address] °Clinic Hours: By appointment only. °Monday - Thursday 8am-5pm, Friday 8am-12pm °Services: Cleanings, fillings, extractions. °Payment Options: °PAYMENT IS DUE AT THE TIME OF SERVICE. °Cash, Visa or MasterCard. Sliding scale - $30 minimum per service. °Best way to get seen: °Come in to office, complete packet and make an appointment - need proof of income °or support monies for each household member and proof of [place] residence. °Usually takes about a month to get in. °  °  °[facility] °[phone] °  °Location: °[address]   [place] °Clinic Hours: Walk-in Urgent Care Dental Services are offered Monday-Friday  mornings only. °The numbers of emergencies accepted daily is limited to the number of °providers available. °Maximum 15 - Mondays, Wednesdays & Thursdays °Maximum 10 - Tuesdays & Fridays °Services: °You do not need to be a [place] resident to be seen for a dental emergency. °Emergencies are defined as pain, swelling, abnormal bleeding, or dental trauma. Walkins will receive x-rays if needed. °NOTE: Dental cleaning is not an emergency. °Payment Options: °PAYMENT IS DUE AT THE TIME OF SERVICE. °Minimum co-pay is $40.00 for uninsured patients. °Minimum co-pay is $3.00 for Medicaid with dental coverage. °Dental Insurance is accepted and must be presented at time of visit. °Medicare does not cover dental. °Forms of payment: Cash, credit card, checks. °Best way to get seen: °If not previously registered with the clinic, walk-in dental registration begins at 7:15 am and is on a first come/first serve basis. °If previously registered with the clinic, call to make an appointment. °  °  °[facility] °[phone] °[place] RESIDENTS ONLY °  °Location: °[address], [place], [place] °Clinic Hours: °Mon-Thu 10a-2p °Services: Extractions only! °Payment Options: °FREE (donations accepted) - bring proof of income or support °Best way to get seen: °Call and schedule an appointment OR come at 8am on the 1st Monday of every month (except for holidays) when it is first come/first served. °  °  °[facility] °[phone] °  °Location: °[address], [place] °Clinic Hours: °Friday mornings °Services, Payment Options, Best way to get seen: °Call for info °

## 2016-11-01 ENCOUNTER — Encounter: Payer: Self-pay | Admitting: Cardiology

## 2016-12-29 ENCOUNTER — Other Ambulatory Visit: Payer: Self-pay

## 2016-12-29 ENCOUNTER — Emergency Department
Admission: EM | Admit: 2016-12-29 | Discharge: 2016-12-29 | Disposition: A | Discharge status: Home / Self Care | Payer: Medicare Other | Attending: Emergency Medicine | Admitting: Emergency Medicine

## 2016-12-29 DIAGNOSIS — E785 Hyperlipidemia, unspecified: Secondary | ICD-10-CM | POA: Insufficient documentation

## 2016-12-29 DIAGNOSIS — I10 Essential (primary) hypertension: Secondary | ICD-10-CM | POA: Insufficient documentation

## 2016-12-29 DIAGNOSIS — R197 Diarrhea, unspecified: Secondary | ICD-10-CM | POA: Diagnosis present

## 2016-12-29 DIAGNOSIS — Z8673 Personal history of transient ischemic attack (TIA), and cerebral infarction without residual deficits: Secondary | ICD-10-CM | POA: Insufficient documentation

## 2016-12-29 DIAGNOSIS — Z79899 Other long term (current) drug therapy: Secondary | ICD-10-CM | POA: Insufficient documentation

## 2016-12-29 DIAGNOSIS — Z7982 Long term (current) use of aspirin: Secondary | ICD-10-CM | POA: Insufficient documentation

## 2016-12-29 DIAGNOSIS — A09 Infectious gastroenteritis and colitis, unspecified: Secondary | ICD-10-CM | POA: Diagnosis not present

## 2016-12-29 LAB — URINALYSIS, COMPLETE (UACMP) WITH MICROSCOPIC
BACTERIA UA: NONE SEEN
BILIRUBIN URINE: NEGATIVE
Glucose, UA: NEGATIVE mg/dL
HGB URINE DIPSTICK: NEGATIVE
Ketones, ur: NEGATIVE mg/dL
LEUKOCYTES UA: NEGATIVE
NITRITE: NEGATIVE
PH: 5 (ref 5.0–8.0)
Protein, ur: NEGATIVE mg/dL
SPECIFIC GRAVITY, URINE: 1.011 (ref 1.005–1.030)
WBC, UA: NONE SEEN WBC/hpf (ref 0–5)

## 2016-12-29 LAB — GASTROINTESTINAL PANEL BY PCR, STOOL (REPLACES STOOL CULTURE)
ADENOVIRUS F40/41: NOT DETECTED
Astrovirus: NOT DETECTED
CAMPYLOBACTER SPECIES: NOT DETECTED
CRYPTOSPORIDIUM: NOT DETECTED
CYCLOSPORA CAYETANENSIS: NOT DETECTED
ENTAMOEBA HISTOLYTICA: NOT DETECTED
Enteroaggregative E coli (EAEC): NOT DETECTED
Enteropathogenic E coli (EPEC): NOT DETECTED
Enterotoxigenic E coli (ETEC): NOT DETECTED
Giardia lamblia: NOT DETECTED
Norovirus GI/GII: NOT DETECTED
PLESIMONAS SHIGELLOIDES: NOT DETECTED
Rotavirus A: NOT DETECTED
SAPOVIRUS (I, II, IV, AND V): NOT DETECTED
SHIGA LIKE TOXIN PRODUCING E COLI (STEC): NOT DETECTED
Salmonella species: NOT DETECTED
Shigella/Enteroinvasive E coli (EIEC): NOT DETECTED
VIBRIO CHOLERAE: NOT DETECTED
VIBRIO SPECIES: NOT DETECTED
YERSINIA ENTEROCOLITICA: NOT DETECTED

## 2016-12-29 LAB — COMPREHENSIVE METABOLIC PANEL
ALT: 21 U/L (ref 17–63)
AST: 27 U/L (ref 15–41)
Albumin: 4.1 g/dL (ref 3.5–5.0)
Alkaline Phosphatase: 83 U/L (ref 38–126)
Anion gap: 9 (ref 5–15)
BILIRUBIN TOTAL: 1.3 mg/dL — AB (ref 0.3–1.2)
BUN: 8 mg/dL (ref 6–20)
CHLORIDE: 101 mmol/L (ref 101–111)
CO2: 26 mmol/L (ref 22–32)
CREATININE: 1.07 mg/dL (ref 0.61–1.24)
Calcium: 9.2 mg/dL (ref 8.9–10.3)
Glucose, Bld: 192 mg/dL — ABNORMAL HIGH (ref 65–99)
POTASSIUM: 3.7 mmol/L (ref 3.5–5.1)
Sodium: 136 mmol/L (ref 135–145)
TOTAL PROTEIN: 7.9 g/dL (ref 6.5–8.1)

## 2016-12-29 LAB — CBC
HCT: 40.5 % (ref 40.0–52.0)
Hemoglobin: 13.4 g/dL (ref 13.0–18.0)
MCH: 31.3 pg (ref 26.0–34.0)
MCHC: 33.1 g/dL (ref 32.0–36.0)
MCV: 94.6 fL (ref 80.0–100.0)
Platelets: 198 10*3/uL (ref 150–440)
RBC: 4.28 MIL/uL — AB (ref 4.40–5.90)
RDW: 14.4 % (ref 11.5–14.5)
WBC: 3.9 10*3/uL (ref 3.8–10.6)

## 2016-12-29 LAB — C DIFFICILE QUICK SCREEN W PCR REFLEX
C Diff antigen: POSITIVE — AB
C Diff toxin: NEGATIVE

## 2016-12-29 LAB — CLOSTRIDIUM DIFFICILE BY PCR: CDIFFPCR: POSITIVE — AB

## 2016-12-29 LAB — LIPASE, BLOOD: Lipase: 34 U/L (ref 11–51)

## 2016-12-29 MED ORDER — ONDANSETRON 4 MG PO TBDP: 4.0000 mg | ORAL_TABLET | Freq: Three times a day (TID) | ORAL | 0 refills | Status: DC | PRN

## 2016-12-29 NOTE — Discharge Instructions (Signed)
please drink clear liquids in small amounts frequently today. Use the Zofran melt on your tongue pills 3 times a day as needed for nausea. Once you can keep down the clear liquids start the Brat diet bananas rice applesauce toast crackers bland food like that. If you can tolerate that for about 8 hours he can try eating regular food. Use Pepto-Bismol as directed for the diarrhea. Please return here for fever unable to keep down fluids or any bloody diarrhea worsening diarrhea or abdominal pain. Please remember the Pepto-Bismol will make your stool black.

## 2016-12-29 NOTE — ED Notes (Signed)
Pt has had no episodes of diarrhea at this time

## 2016-12-29 NOTE — ED Provider Notes (Signed)
[facility] Emergency Department Provider Note   ____________________________________________   First MD Initiated Contact with Patient 12/29/16 1131     (approximate)  I have reviewed the triage vital signs and the nursing notes.   HISTORY  Chief Complaint Diarrhea   HPI Marc Bradford is a 56 y.o. male Patient reports she's had nausea vomiting and diarrhea for 5 or 6 days. Anything he drinks comes back up he's tried water chicken broth and other liquids but they do not work. He's having frequent diarrhea as of 10 and 24 hours no blood some cramping but not bad no fever known else is sick. I see the nurse's notes 1 places he denies vomiting but does not what he told me patient also complains of chronic toothache. He has called around and tried to get seen but he tells me that the $179 [facility] and he can't afford it. He does have bad dentition.  Past Medical History:  Diagnosis Date  . Gout   . Hyperlipidemia   . Hypertension 09/30/2013  . 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 s/p MDT dual chamber ICD implant 03/2014 followed by Dr Graciela HusbandsKlein  . OSA (obstructive sleep apnea) 10/28/2014   Severe with AHI 78/hr now on CPAP  . Stroke (HCC) 09/22/2013   a. non-hemorrhagic b. residual left sided weakness    Patient Active Problem List   Diagnosis Date Noted  . OSA (obstructive sleep apnea) 10/28/2014  . Morbid obesity (HCC) 10/28/2014  . (HFpEF) heart failure with preserved ejection fraction (HCC) 04/06/2014  . 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 Essential hypertension 09/30/2013  . Hyperlipidemia 09/30/2013  . Left-sided weakness 09/28/2013    History reviewed. No pertinent surgical history.  Prior to Admission medications   Medication Sig Start Date End Date Taking? Authorizing Provider  amLODipine (NORVASC) 5 MG tablet Take 5 mg by mouth daily.    [provider]  aspirin EC 81  MG tablet Take 81 mg by mouth daily.    [provider]  Cholecalciferol (VITAMIN D PO) Take 1 tablet by mouth daily.    [provider]  diphenhydrAMINE (BENADRYL) 25 mg capsule Take 1 capsule (25 mg total) by mouth every 4 (four) hours as needed. 10/31/16 10/31/17  Enid DerryWagner, Ashley, PA-C  furosemide (LASIX) 20 MG tablet Take 1 tablet (20 mg total) by mouth daily. 07/22/14   Duke SalviaKlein, Steven C, MD  HYDROcodone-acetaminophen (NORCO/VICODIN) 5-325 MG per tablet Take 1 tablet by mouth every 6 (six) hours as needed for moderate pain or severe pain. 04/07/14   Azalee CourseMeng, Hao, PA  HYDROcodone-homatropine [facility](HYCODAN) 5-1.5 MG/5ML syrup Take 5 mLs by mouth every 6 (six) hours as needed for cough. 02/23/15   Ignacia Bayleyumey, Robert, PA-C  hydrOXYzine (ATARAX/VISTARIL) 50 MG tablet Take 1 tablet (50 mg total) by mouth 3 (three) times daily as needed. 08/16/14   Joni ReiningSmith, Ronald K, PA-C  ondansetron (ZOFRAN ODT) 4 MG disintegrating tablet Take 1 tablet (4 mg total) every 8 (eight) hours as needed by mouth for nausea or vomiting. 12/29/16   Arnaldo NatalMalinda, Mirakle Tomlin F, MD  propranolol ER (INDERAL LA) 80 MG 24 hr capsule Take 1 capsule (80 mg total) by mouth daily. 04/07/14   Azalee CourseMeng, Hao, PA  quinapril (ACCUPRIL) 40 MG tablet Take 40 mg by mouth daily.    [provider]  simvastatin (ZOCOR) 20 MG tablet Take 20 mg by mouth at bedtime.    [provider]  Allergies Patient has no known allergies.  Family History  Problem Relation Age of Onset  . Heart attack Mother   . Sudden death Mother   . Hypertension Mother   . Sudden death Father   . Diabetes Other        family history  . Drug abuse Brother   . Anemia Neg Hx   . Arrhythmia Neg Hx   . Asthma Neg Hx   . Cancer Neg Hx   . Depression Neg Hx   . Heart failure Neg Hx   . Hyperlipidemia Neg Hx   . Kidney disease Neg Hx   . Thyroid disease Neg Hx   . Fainting Neg Hx   . Stroke Neg Hx     Social History Social History   Tobacco Use  . Smoking  status: Never Smoker  . Smokeless tobacco: Current User    Types: Snuff  Substance Use Topics  . Alcohol use: Yes    Alcohol/week: 1.2 oz    Types: 2 Cans of beer per week  . Drug use: No    Review of Systems  Constitutional: No fever/chills Eyes: No visual changes. ENT: No sore throat. Cardiovascular: Denies chest pain. Respiratory: Denies shortness of breath. Gastrointestinal: see history of present illness Genitourinary: Negative for dysuria. Musculoskeletal: Negative for back pain. Skin: Negative for rash. Neurological: Negative for headaches, focal weakness   ____________________________________________   PHYSICAL EXAM:  VITAL SIGNS: ED Triage Vitals  Enc Vitals Group     BP 12/29/16 1054 (!) 144/69     Pulse Rate 12/29/16 1054 (!) 59     Resp 12/29/16 1054 17     Temp 12/29/16 1054 97.6 F (36.4 C)     Temp Source 12/29/16 1054 Oral     SpO2 12/29/16 1054 99 %     Weight 12/29/16 1050 220 lb (99.8 kg)     Height 12/29/16 1050 5\' 6"  (1.676 m)     Head Circumference --      Peak Flow --      Pain Score 12/29/16 1050 0     Pain Loc --      Pain Edu? --      Excl. in GC? --     Constitutional: Alert and oriented. Well appearing and in no acute distress. Eyes: Conjunctivae are normal.  Head: Atraumatic. Nose: No congestion/rhinnorhea. Mouth/Throat: Mucous membranes are moist.  Oropharynx non-erythematous. Neck: No stridor.   Cardiovascular: Normal rate, regular rhythm. Grossly normal heart sounds.  Good peripheral circulation. Respiratory: Normal respiratory effort.  No retractions. Lungs CTAB. Gastrointestinal: Soft and nontender. No distention. No abdominal bruits. No CVA tenderness. }Musculoskeletal: No lower extremity tenderness nor edema.  No joint effusions. Neurologic:  Normal speech and language. No gross focal neurologic deficits are appreciated.  Skin:  Skin is warm, dry and intact. No rash noted. Psychiatric: Mood and affect are normal. Speech  and behavior are normal.  ____________________________________________   LABS (all labs ordered are listed, but only abnormal results are displayed)  Labs Reviewed  C DIFFICILE QUICK SCREEN W PCR REFLEX - Abnormal; Notable for the following components:      Result Value   C Diff antigen POSITIVE (*)    All other components within normal limits  COMPREHENSIVE METABOLIC PANEL - Abnormal; Notable for the following components:   Glucose, Bld 192 (*)    Total Bilirubin 1.3 (*)    All other components within normal limits  CBC - Abnormal; Notable for the following components:  RBC 4.28 (*)    All other components within normal limits  URINALYSIS, COMPLETE (UACMP) WITH MICROSCOPIC - Abnormal; Notable for the following components:   Color, Urine YELLOW (*)    APPearance CLEAR (*)    Squamous Epithelial / LPF 0-5 (*)    All other components within normal limits  GASTROINTESTINAL PANEL BY PCR, STOOL (REPLACES STOOL CULTURE)  CLOSTRIDIUM DIFFICILE BY PCR  LIPASE, BLOOD   ____________________________________________  EKG   ____________________________________________  RADIOLOGY   ____________________________________________   PROCEDURES  Procedure(s) performed:  Procedures  Critical Care performed:   ____________________________________________   INITIAL IMPRESSION / ASSESSMENT AND PLAN / ED COURSE  As part of my medical decision making, I reviewed the following data within the electronic MEDICAL RECORD NUMBERcare everywhere was reviewed as were the hospital notes the hospital records show a visit for dental abscess with facial swelling.   I added a prescription for penicillin VK 504 times a day #40 and Percocet 06/21/2018 514 times a day #10. Patient is doing better tolerating by mouth well at him go the stool PCR is negative E C. difficile is positive for antigen but not toxic and he is not acting like a C. difficile patient he has no fever he only had one episode of  diarrhea all time he is here I will let him go as I said on not treat him with anything more than Pepto-Bismol.  ____________________________________________   FINAL CLINICAL IMPRESSION(S) / ED DIAGNOSES  Final diagnoses:  Diarrhea of infectious origin     ED Discharge Orders        Ordered    ondansetron (ZOFRAN ODT) 4 MG disintegrating tablet  Every 8 hours PRN     12/29/16 1611       Note:  This document was prepared using Dragon voice recognition software and may include unintentional dictation errors.    Arnaldo Natal, MD 12/29/16 [phone]

## 2016-12-29 NOTE — ED Notes (Signed)
Pt is aware that stool sample is needed

## 2016-12-29 NOTE — ED Notes (Addendum)
Pt reports he has had diarrhea for 5-6 days (10+ in 24 hours) and nausea - denies vomiting - reports lower abd pain that radiates to center of abd - pain is off and on for the last 5-6 days Pt also reports left jaw swelling and dental pain upper and lower - reports multiple dental caries and no insurance or money to get teeth pulled

## 2016-12-29 NOTE — ED Triage Notes (Signed)
Pt arrives to ED via POV with c/o diarrhea x5-6 days. Pt reports "normal" diarrhea, non-bloody; denies OTC treatment PTA. Pt denies N/V/D, no SHOB or CP. Pt also asking for pain medications r/t tooth/mouth pain.

## 2017-01-29 ENCOUNTER — Ambulatory Visit (INDEPENDENT_AMBULATORY_CARE_PROVIDER_SITE_OTHER): Payer: Medicare Other | Admitting: *Deleted

## 2017-01-29 DIAGNOSIS — I422 Other hypertrophic cardiomyopathy: Secondary | ICD-10-CM | POA: Diagnosis not present

## 2017-01-29 NOTE — Progress Notes (Signed)
Remote ICD transmission.   

## 2017-01-31 LAB — CUP PACEART REMOTE DEVICE CHECK
Brady Statistic AP VP Percent: 0.06 %
Brady Statistic AP VS Percent: 43.62 %
Brady Statistic AS VP Percent: 0.03 %
Brady Statistic AS VS Percent: 56.29 %
Brady Statistic RV Percent Paced: 0.1 %
HIGH POWER IMPEDANCE MEASURED VALUE: 65 Ohm
Implantable Lead Implant Date: 20160217
Implantable Lead Location: 753859
Lead Channel Impedance Value: 342 Ohm
Lead Channel Impedance Value: 475 Ohm
Lead Channel Pacing Threshold Amplitude: 0.5 V
Lead Channel Pacing Threshold Amplitude: 0.625 V
Lead Channel Pacing Threshold Pulse Width: 0.4 ms
Lead Channel Sensing Intrinsic Amplitude: 31.625 mV
Lead Channel Sensing Intrinsic Amplitude: 31.625 mV
Lead Channel Setting Pacing Amplitude: 2 V
Lead Channel Setting Pacing Amplitude: 2.5 V
Lead Channel Setting Pacing Pulse Width: 0.4 ms
Lead Channel Setting Sensing Sensitivity: 0.3 mV
MDC IDC LEAD IMPLANT DT: 20160217
MDC IDC LEAD LOCATION: 753860
MDC IDC MSMT BATTERY REMAINING LONGEVITY: 98 mo
MDC IDC MSMT BATTERY VOLTAGE: 3 V
MDC IDC MSMT LEADCHNL RA PACING THRESHOLD PULSEWIDTH: 0.4 ms
MDC IDC MSMT LEADCHNL RA SENSING INTR AMPL: 3.5 mV
MDC IDC MSMT LEADCHNL RA SENSING INTR AMPL: 3.5 mV
MDC IDC MSMT LEADCHNL RV IMPEDANCE VALUE: 456 Ohm
MDC IDC PG IMPLANT DT: 20160217
MDC IDC SESS DTM: 20181212072503
MDC IDC STAT BRADY RA PERCENT PACED: 43.56 %

## 2017-02-04 ENCOUNTER — Encounter: Payer: Self-pay | Admitting: Cardiology

## 2017-03-11 ENCOUNTER — Telehealth: Payer: Self-pay

## 2017-03-11 NOTE — Telephone Encounter (Signed)
   Primary Cardiologist:Steven Graciela HusbandsKlein, MD  Chart reviewed as part of pre-operative protocol coverage. Because of Marc Bradford's past medical history and time since last visit, he/she will require a follow-up visit in order to better assess preoperative cardiovascular risk.  Pre-op covering staff: - Please schedule appointment and call patient to inform them. - Please contact requesting surgeon's office via preferred method (i.e, phone, fax) to inform them of need for appointment prior to surgery.  Theodore Demarkhonda Barrett, PA-C  03/11/2017, 2:55 PM

## 2017-03-11 NOTE — Telephone Encounter (Signed)
Patient called back, was given his appointment time and stated he will be at his appointment.

## 2017-03-11 NOTE — Telephone Encounter (Signed)
Called patient to inform him that an appointment was made with Np for pre op clearance, spoke with wife she went to put husband, Mr Marc Bradford on the phone and some how we got disconnected 3 times.

## 2017-03-11 NOTE — Telephone Encounter (Signed)
   [facility] Pre-operative Risk Assessment    Request for surgical clearance:  1. What type of surgery is being performed? Dental extraction  2. When is this surgery scheduled? Pending clearance  3. Are there any medications that need to be held prior to surgery and how long? Please advise  4. Practice name and name of physician performing surgery? [facility], DDS, MD  5. What is your office phone and fax number? Phone: [phone] Fax: [phone]  6. Anesthesia type (None, local, MAC, general) ? IV sedation   Marc Bradford 03/11/2017, 2:11 PM  _________________________________________________________________   (provider comments below)

## 2017-03-17 ENCOUNTER — Ambulatory Visit (INDEPENDENT_AMBULATORY_CARE_PROVIDER_SITE_OTHER): Payer: Medicare Other | Admitting: Physician Assistant

## 2017-03-17 ENCOUNTER — Encounter: Payer: Self-pay | Admitting: Cardiology

## 2017-03-17 VITALS — BP 146/82 | HR 71 | Resp 16 | Ht 66.0 in | Wt 247.0 lb

## 2017-03-17 DIAGNOSIS — I4729 Other ventricular tachycardia: Secondary | ICD-10-CM

## 2017-03-17 DIAGNOSIS — I422 Other hypertrophic cardiomyopathy: Secondary | ICD-10-CM | POA: Diagnosis not present

## 2017-03-17 DIAGNOSIS — I5030 Unspecified diastolic (congestive) heart failure: Secondary | ICD-10-CM

## 2017-03-17 DIAGNOSIS — I10 Essential (primary) hypertension: Secondary | ICD-10-CM

## 2017-03-17 DIAGNOSIS — Z01818 Encounter for other preprocedural examination: Secondary | ICD-10-CM | POA: Insufficient documentation

## 2017-03-17 DIAGNOSIS — I472 Ventricular tachycardia: Secondary | ICD-10-CM | POA: Diagnosis not present

## 2017-03-17 NOTE — Patient Instructions (Signed)
Medication Instructions:  Your physician recommends that you continue on your current medications as directed. Please refer to the Current Medication list given to you today.  Labwork: NONE ORDERED TODAY  Testing/Procedures: NONE ORDERED TODAY  Follow-Up: WE WILL SEND OUT REMINDER LETTERS FOR YOU TO MAKE APPTS FOR BOTH DR. Mayford KnifeURNER AND DR. KLEIN.   Any Other Special Instructions Will Be Listed Below (If Applicable). YOU HAVE BEEN CLEARED FOR YOUR DENTAL SURGERY. OUR OFFICE WILL FAX OVER CLEARANCE TO THE SURGEON    If you need a refill on your cardiac medications before your next appointment, please call your pharmacy.

## 2017-03-17 NOTE — Progress Notes (Signed)
Cardiology Office Note:    Date:  03/17/2017   ID:  Marc Bradford, DOB [birth year], MRN [id number]  PCP:  [facility]  Cardiologist:  Sherryl Manges, MD   Referring MD: [facility]   Chief Complaint  Patient presents with  . Medical Clearance    dental procedure     History of Present Illness:    Marc Bradford is a 57 y.o. male with a hx of severe OSA on CPAP (sees Dr. Mayford Knife), atypical hypertrophic cardiomyopathy, NSVT s/p ICD placement, HFpEF, HTN, prior stroke with residual weakness, and HLD. He recently saw Dr. Graciela Husbands in clinic on 07/31/16. At that time, he was euvolemic on exam and was doing well on his diuretic regimen.   He was recently seen in the ED with diarrhea. GI panel was negative. C diff antigen was positive and toxin was negative. He was not treated for C diff at that time given his clinical picture. He has also had tooth abscess for which he was prescribed penicillin.   He comes back today for preoperative clearance for dental extraction. He has been doing well and is using his CPAP at night. He denies chest pain, shortness of breath, orthopnea, lower extremity swelling, bleeding problems, dizziness, and feelings of syncope. His pressure was initially elevated at 146/82; however, I rechecked this and found his systolic pressure to be 130. EKG is stable from previous. No problems with his medications, he has been compliant. He tries to avoid salt.   Of note, his wife died 2 days ago and he is busy planning her funeral. He is doing very well under the circumstances.   Past Medical History:  Diagnosis Date  . Gout   . Hyperlipidemia   . Hypertension 09/30/2013  . 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 s/p MDT dual chamber ICD implant 03/2014 followed by Dr Graciela Husbands  . OSA (obstructive sleep apnea) 10/28/2014   Severe with AHI 78/hr now on CPAP  . Stroke (HCC) 09/22/2013   a. non-hemorrhagic b. residual left sided weakness    Past  Surgical History:  Procedure Laterality Date  . IMPLANTABLE CARDIOVERTER DEFIBRILLATOR IMPLANT N/A 04/06/2014   MDT MRI compatible dual chamber ICD implanted by Dr Graciela Husbands    Current Medications: Current Meds  Medication Sig  . amLODipine (NORVASC) 5 MG tablet Take 5 mg by mouth daily.  Marland Kitchen aspirin EC 81 MG tablet Take 81 mg by mouth daily.  . Cholecalciferol (VITAMIN D PO) Take 1 tablet by mouth daily.  . diphenhydrAMINE (BENADRYL) 25 mg capsule Take 1 capsule (25 mg total) by mouth every 4 (four) hours as needed.  . furosemide (LASIX) 20 MG tablet Take 1 tablet (20 mg total) by mouth daily.  Marland Kitchen HYDROcodone-acetaminophen (NORCO/VICODIN) 5-325 MG per tablet Take 1 tablet by mouth every 6 (six) hours as needed for moderate pain or severe pain.  Marland Kitchen HYDROcodone-homatropine (HYCODAN) 5-1.5 MG/5ML syrup Take 5 mLs by mouth every 6 (six) hours as needed for cough.  . hydrOXYzine (ATARAX/VISTARIL) 50 MG tablet Take 1 tablet (50 mg total) by mouth 3 (three) times daily as needed.  . ondansetron (ZOFRAN ODT) 4 MG disintegrating tablet Take 1 tablet (4 mg total) every 8 (eight) hours as needed by mouth for nausea or vomiting.  . propranolol ER (INDERAL LA) 80 MG 24 hr capsule Take 1 capsule (80 mg total) by mouth daily.  . quinapril (ACCUPRIL) 40 MG tablet Take 40 mg by mouth daily.  . simvastatin (ZOCOR) 20  MG tablet Take 20 mg by mouth at bedtime.     Allergies:   Patient has no known allergies.   Social History   Socioeconomic History  . Marital status: Married    Spouse name: None  . Number of children: None  . Years of education: None  . Highest education level: None  Social Needs  . Financial resource strain: None  . Food insecurity - worry: None  . Food insecurity - inability: None  . Transportation needs - medical: None  . Transportation needs - non-medical: None  Occupational History  . None  Tobacco Use  . Smoking status: Never Smoker  . Smokeless tobacco: Current User    Types:  Snuff  Substance and Sexual Activity  . Alcohol use: Yes    Alcohol/week: 1.2 oz    Types: 2 Cans of beer per week  . Drug use: No  . Sexual activity: Yes  Other Topics Concern  . None  Social History Narrative  . None     Family History: The patient's family history includes Diabetes in his other; Drug abuse in his brother; Heart attack in his mother; Hypertension in his mother; Sudden death in his father and mother. There is no history of Anemia, Arrhythmia, Asthma, Cancer, Depression, Heart failure, Hyperlipidemia, Kidney disease, Thyroid disease, Fainting, or Stroke.  ROS:   Please see the history of present illness.     All other systems reviewed and are negative.  EKGs/Labs/Other Studies Reviewed:    The following studies were reviewed today: Echo, ekg  EKG:  EKG is ordered today.  The ekg ordered today demonstrates sinus with TWI precordial leads, unchanged from previous  Recent Labs: 12/29/2016: ALT 21; BUN 8; Creatinine, Ser 1.07; Hemoglobin 13.4; Platelets 198; Potassium 3.7; Sodium 136  Recent Lipid Panel    Component Value Date/Time   CHOL 180 09/29/2013 0238   CHOL 120 05/24/2011 0444   TRIG 382 (H) 09/29/2013 0238   TRIG 288 (H) 05/24/2011 0444   HDL 35 (L) 09/29/2013 0238   HDL 28 (L) 05/24/2011 0444   CHOLHDL 5.1 09/29/2013 0238   VLDL 76 (H) 09/29/2013 0238   VLDL 58 (H) 05/24/2011 0444   LDLCALC 69 09/29/2013 0238   LDLCALC 34 05/24/2011 0444    Physical Exam:    VS:  BP (!) 146/82   Pulse 71   Resp 16   Ht 5\' 6"  (1.676 m)   Wt 247 lb (112 kg)   SpO2 96%   BMI 39.87 kg/m     Wt Readings from Last 3 Encounters:  03/17/17 247 lb (112 kg)  12/29/16 220 lb (99.8 kg)  10/31/16 237 lb (107.5 kg)     GEN:  Well nourished, well developed in no acute distress HEENT: Normal NECK: No JVD; No carotid bruits LYMPHATICS: No lymphadenopathy CARDIAC: RRR, no murmurs, rubs, gallops RESPIRATORY:  Clear to auscultation without rales, wheezing or  rhonchi  ABDOMEN: Soft, non-tender, non-distended MUSCULOSKELETAL:  No edema; No deformity  SKIN: Warm and dry NEUROLOGIC:  Alert and oriented x 3 PSYCHIATRIC:  Normal affect    ASSESSMENT and PLAN:    1. HCM-atypical variant, chronic diastolic heart failure, ICD in place - remote device check with normal function and stable impedance - last echo 2015 with normal EF - continue propranolol and lasix 20 mg daily - pt denies new symptoms   2. NSVT - EKG today unchanged from previous - last device check with no shocks delivered   3. HTN -  continue norvasc, quinapril - recheck BP with systolic 130   4. Morbid obesity - pt down 20 lbs from June to November 2018 (220 lbs) - today weight is 247 lbs - encouraged weight loss, although he is under circumstantial stress with the recent (2 days ago) loss of his wife   5. HLD - continue zocor and ASA - last lipid panel 2015, follow up with PCP   6.Preoperative clearance for dental extraction - Pt does not need any further testing. He is at acceptable risk for dental extraction. Pt should continue taking ASA, but OK to hold and resume per dentistry.  Chart reviewed as part of pre-operative protocol coverage. Given past medical history and time since last visit, based on ACC/AHA guidelines, Jasmine DecemberJohnny Zappia would be at acceptable risk for the planned procedure without further cardiovascular testing.   I will route this recommendation to the requesting party via Epic fax function and remove from pre-op pool.  Please call with questions.   Medication Adjustments/Labs and Tests Ordered: Current medicines are reviewed at length with the patient today.  Concerns regarding medicines are outlined above.  Orders Placed This Encounter  Procedures  . EKG 12-Lead   No orders of the defined types were placed in this encounter.   Signed, Marcelino Dusterngela Nicole Shawnte Demarest, [place]  03/17/2017 11:54 AM    [facility]

## 2017-04-10 ENCOUNTER — Encounter: Payer: Self-pay | Admitting: Emergency Medicine

## 2017-04-10 ENCOUNTER — Emergency Department
Admission: EM | Admit: 2017-04-10 | Discharge: 2017-04-10 | Disposition: A | Discharge status: Home / Self Care | Payer: Medicare Other | Attending: Emergency Medicine | Admitting: Emergency Medicine

## 2017-04-10 ENCOUNTER — Other Ambulatory Visit: Payer: Self-pay

## 2017-04-10 DIAGNOSIS — Z79899 Other long term (current) drug therapy: Secondary | ICD-10-CM | POA: Diagnosis not present

## 2017-04-10 DIAGNOSIS — R69 Illness, unspecified: Secondary | ICD-10-CM

## 2017-04-10 DIAGNOSIS — R05 Cough: Secondary | ICD-10-CM | POA: Diagnosis present

## 2017-04-10 DIAGNOSIS — Z95810 Presence of automatic (implantable) cardiac defibrillator: Secondary | ICD-10-CM | POA: Insufficient documentation

## 2017-04-10 DIAGNOSIS — J111 Influenza due to unidentified influenza virus with other respiratory manifestations: Secondary | ICD-10-CM | POA: Insufficient documentation

## 2017-04-10 DIAGNOSIS — Z7982 Long term (current) use of aspirin: Secondary | ICD-10-CM | POA: Diagnosis not present

## 2017-04-10 DIAGNOSIS — I10 Essential (primary) hypertension: Secondary | ICD-10-CM | POA: Diagnosis not present

## 2017-04-10 DIAGNOSIS — Z8673 Personal history of transient ischemic attack (TIA), and cerebral infarction without residual deficits: Secondary | ICD-10-CM | POA: Diagnosis not present

## 2017-04-10 MED ORDER — GUAIFENESIN-CODEINE 100-10 MG/5ML PO SOLN: 5.0000 mL | Freq: Four times a day (QID) | ORAL | 0 refills | Status: DC | PRN

## 2017-04-10 NOTE — ED Provider Notes (Signed)
[facility] Emergency Department Provider Note  ____________________________________________   First MD Initiated Contact with Patient 04/10/17 [phone]     (approximate)  I have reviewed the triage vital signs and the nursing notes.   HISTORY  Chief Complaint Cough and Fever  HPI Marc Bradford is a 57 y.o. male is here with complaint of cough and fever for the last 2-3 days.  Patient states that his symptoms began 5 days ago.  Patient states that his grandson was diagnosed with the flu last week and he came to stay with him.  Patient's grandson has been taking Tamiflu and patient decided to take some of his last evening.  He complains of body aches, cough, fever.  He rates his pain as 6/10.   Past Medical History:  Diagnosis Date  . Gout   . Hyperlipidemia   . Hypertension 09/30/2013  . 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 s/p MDT dual chamber ICD implant 03/2014 followed by Dr Graciela Husbands  . OSA (obstructive sleep apnea) 10/28/2014   Severe with AHI 78/hr now on CPAP  . Stroke (HCC) 09/22/2013   a. non-hemorrhagic b. residual left sided weakness    Patient Active Problem List   Diagnosis Date Noted  . NSVT (nonsustained ventricular tachycardia) (HCC) 03/17/2017  . Pre-operative clearance 03/17/2017  . OSA (obstructive sleep apnea) 10/28/2014  . Morbid obesity (HCC) 10/28/2014  . (HFpEF) heart failure with preserved ejection fraction (HCC) 04/06/2014  . 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 Essential hypertension 09/30/2013  . Hyperlipidemia 09/30/2013  . Left-sided weakness 09/28/2013    Past Surgical History:  Procedure Laterality Date  . IMPLANTABLE CARDIOVERTER DEFIBRILLATOR IMPLANT N/A 04/06/2014   MDT MRI compatible dual chamber ICD implanted by Dr Graciela Husbands    Prior to Admission medications   Medication Sig Start Date End Date Taking? Authorizing Provider  amLODipine (NORVASC) 5 MG tablet Take 5 mg by mouth daily.     [provider]  aspirin EC 81 MG tablet Take 81 mg by mouth daily.    [provider]  Cholecalciferol (VITAMIN D PO) Take 1 tablet by mouth daily.    [provider]  diphenhydrAMINE (BENADRYL) 25 mg capsule Take 1 capsule (25 mg total) by mouth every 4 (four) hours as needed. 10/31/16 10/31/17  Enid Derry, PA-C  furosemide (LASIX) 20 MG tablet Take 1 tablet (20 mg total) by mouth daily. 07/22/14   Duke Salvia, MD  guaiFENesin-codeine 100-10 MG/5ML syrup Take 5 mLs by mouth every 6 (six) hours as needed. 04/10/17   Tommi Rumps, PA-C  ondansetron (ZOFRAN ODT) 4 MG disintegrating tablet Take 1 tablet (4 mg total) every 8 (eight) hours as needed by mouth for nausea or vomiting. 12/29/16   Arnaldo Natal, MD  propranolol ER (INDERAL LA) 80 MG 24 hr capsule Take 1 capsule (80 mg total) by mouth daily. 04/07/14   Azalee Course, PA  quinapril (ACCUPRIL) 40 MG tablet Take 40 mg by mouth daily.    [provider]  simvastatin (ZOCOR) 20 MG tablet Take 20 mg by mouth at bedtime.    [provider]    Allergies Patient has no known allergies.  Family History  Problem Relation Age of Onset  . Heart attack Mother   . Sudden death Mother   . Hypertension Mother   . Sudden death Father   . Diabetes Other        family history  . Drug abuse Brother   .  Anemia Neg Hx   . Arrhythmia Neg Hx   . Asthma Neg Hx   . Cancer Neg Hx   . Depression Neg Hx   . Heart failure Neg Hx   . Hyperlipidemia Neg Hx   . Kidney disease Neg Hx   . Thyroid disease Neg Hx   . Fainting Neg Hx   . Stroke Neg Hx     Social History Social History   Tobacco Use  . Smoking status: Never Smoker  . Smokeless tobacco: Current User    Types: Snuff  Substance Use Topics  . Alcohol use: Yes    Alcohol/week: 1.2 oz    Types: 2 Cans of beer per week  . Drug use: No    Review of Systems Constitutional: Positive fever/chills Eyes: No visual changes. ENT: Positive sore  throat. Cardiovascular: Denies chest pain. Respiratory: Denies shortness of breath.  Positive cough. Gastrointestinal:  No nausea, no vomiting.  No diarrhea. Musculoskeletal: Positive body aches. Skin: Negative for rash. Neurological: Negative for headaches, focal weakness or numbness. ___________________________________________   PHYSICAL EXAM:  VITAL SIGNS: ED Triage Vitals  Enc Vitals Group     BP 04/10/17 0822 140/69     Pulse Rate 04/10/17 0822 72     Resp 04/10/17 0822 18     Temp 04/10/17 0822 99.4 F (37.4 C)     Temp src --      SpO2 04/10/17 0822 97 %     Weight --      Height --      Head Circumference --      Peak Flow --      Pain Score 04/10/17 0817 6     Pain Loc --      Pain Edu? --      Excl. in GC? --    Constitutional: Alert and oriented. Well appearing and in no acute distress. Eyes: Conjunctivae are normal.  Head: Atraumatic. Nose: Mild congestion/rhinnorhea. Mouth/Throat: Mucous membranes are moist.  Oropharynx non-erythematous.  Positive for posterior drainage.  Uvula is midline.  No exudate noted. Neck: No stridor.   Hematological/Lymphatic/Immunilogical: No cervical lymphadenopathy. Cardiovascular: Normal rate, regular rhythm. Grossly normal heart sounds.  Good peripheral circulation. Respiratory: Normal respiratory effort.  No retractions. Lungs CTAB. Gastrointestinal: Soft and nontender. No distention. Musculoskeletal: Moves upper and lower extremities without any difficulty.  Normal gait was noted. Neurologic:  Normal speech and language. No gross focal neurologic deficits are appreciated. No gait instability. Skin:  Skin is warm, dry and intact. No rash noted. Psychiatric: Mood and affect are normal. Speech and behavior are normal.  ____________________________________________   LABS (all labs ordered are listed, but only abnormal results are displayed)  Labs Reviewed - No data to display  PROCEDURES  Procedure(s) performed:  None  Procedures  Critical Care performed: No  ____________________________________________   INITIAL IMPRESSION / ASSESSMENT AND PLAN / ED COURSE  Explained to patient that his symptoms began greater than 72 hours prior to his arrival.  Patient was given a prescription for Robitussin-AC as needed for cough and congestion.  He is to continue with Tylenol or ibuprofen as needed for fever and body aches.  He is to follow-up with his PCP if any continued problems. ___________________________________________   FINAL CLINICAL IMPRESSION(S) / ED DIAGNOSES  Final diagnoses:  Influenza-like illness     ED Discharge Orders        Ordered    guaiFENesin-codeine 100-10 MG/5ML syrup  Every 6 hours PRN  04/10/17 0949       Note:  This document was prepared using Dragon voice recognition software and may include unintentional dictation errors.    Tommi Rumps, PA-C 04/10/17 1242    Jene Every, MD 04/10/17 1335

## 2017-04-10 NOTE — Discharge Instructions (Signed)
Follow-up with your doctor at [facility] if any continued problems.  Begin taking Robitussin-AC as needed for cough and congestion.  Increase fluids. Take Tylenol or ibuprofen as needed for fever.

## 2017-04-10 NOTE — ED Triage Notes (Signed)
C/O cough and fever x 2-3 days.  States symptoms worsened yesterday.  Ibuprofen taken this morning.

## 2017-04-10 NOTE — ED Notes (Signed)
See triage note  States he developed body aches,subjective fever and cough last pm  Low grade fever on arrival

## 2017-04-30 ENCOUNTER — Ambulatory Visit (INDEPENDENT_AMBULATORY_CARE_PROVIDER_SITE_OTHER): Payer: Medicare Other | Admitting: *Deleted

## 2017-04-30 ENCOUNTER — Telehealth: Payer: Self-pay | Admitting: Cardiology

## 2017-04-30 DIAGNOSIS — I5030 Unspecified diastolic (congestive) heart failure: Secondary | ICD-10-CM

## 2017-04-30 DIAGNOSIS — I422 Other hypertrophic cardiomyopathy: Secondary | ICD-10-CM

## 2017-04-30 NOTE — Telephone Encounter (Signed)
Spoke with pt and reminded pt of remote transmission that is due today. Pt verbalized understanding.   

## 2017-05-01 ENCOUNTER — Encounter: Payer: Self-pay | Admitting: Cardiology

## 2017-05-01 NOTE — Progress Notes (Signed)
Remote ICD transmission.   

## 2017-05-08 LAB — CUP PACEART REMOTE DEVICE CHECK
Battery Remaining Longevity: 95 mo
Brady Statistic AP VS Percent: 38.03 %
Brady Statistic AS VP Percent: 0.02 %
Brady Statistic RA Percent Paced: 38.02 %
Date Time Interrogation Session: 20190313183356
HighPow Impedance: 70 Ohm
Implantable Lead Implant Date: 20160217
Implantable Lead Implant Date: 20160217
Implantable Lead Location: 753860
Implantable Lead Model: 5076
Lead Channel Impedance Value: 475 Ohm
Lead Channel Pacing Threshold Amplitude: 0.5 V
Lead Channel Pacing Threshold Amplitude: 0.625 V
Lead Channel Sensing Intrinsic Amplitude: 3.875 mV
Lead Channel Sensing Intrinsic Amplitude: 3.875 mV
Lead Channel Setting Pacing Amplitude: 2 V
Lead Channel Setting Pacing Amplitude: 2.5 V
MDC IDC LEAD LOCATION: 753859
MDC IDC MSMT BATTERY VOLTAGE: 2.99 V
MDC IDC MSMT LEADCHNL RA IMPEDANCE VALUE: 513 Ohm
MDC IDC MSMT LEADCHNL RA PACING THRESHOLD PULSEWIDTH: 0.4 ms
MDC IDC MSMT LEADCHNL RV IMPEDANCE VALUE: 399 Ohm
MDC IDC MSMT LEADCHNL RV PACING THRESHOLD PULSEWIDTH: 0.4 ms
MDC IDC MSMT LEADCHNL RV SENSING INTR AMPL: 31.625 mV
MDC IDC MSMT LEADCHNL RV SENSING INTR AMPL: 31.625 mV
MDC IDC PG IMPLANT DT: 20160217
MDC IDC SET LEADCHNL RV PACING PULSEWIDTH: 0.4 ms
MDC IDC SET LEADCHNL RV SENSING SENSITIVITY: 0.3 mV
MDC IDC STAT BRADY AP VP PERCENT: 0.04 %
MDC IDC STAT BRADY AS VS PERCENT: 61.9 %
MDC IDC STAT BRADY RV PERCENT PACED: 0.06 %

## 2017-07-10 ENCOUNTER — Emergency Department
Admission: EM | Admit: 2017-07-10 | Discharge: 2017-07-10 | Disposition: A | Discharge status: Home / Self Care | Payer: Medicare Other | Attending: Emergency Medicine | Admitting: Emergency Medicine

## 2017-07-10 ENCOUNTER — Encounter: Payer: Self-pay | Admitting: Emergency Medicine

## 2017-07-10 DIAGNOSIS — Z79899 Other long term (current) drug therapy: Secondary | ICD-10-CM | POA: Insufficient documentation

## 2017-07-10 DIAGNOSIS — L299 Pruritus, unspecified: Secondary | ICD-10-CM | POA: Diagnosis not present

## 2017-07-10 DIAGNOSIS — F17220 Nicotine dependence, chewing tobacco, uncomplicated: Secondary | ICD-10-CM | POA: Insufficient documentation

## 2017-07-10 DIAGNOSIS — I10 Essential (primary) hypertension: Secondary | ICD-10-CM | POA: Diagnosis not present

## 2017-07-10 DIAGNOSIS — R21 Rash and other nonspecific skin eruption: Secondary | ICD-10-CM | POA: Diagnosis present

## 2017-07-10 LAB — DIFFERENTIAL
BASOS PCT: 1 %
Basophils Absolute: 0 10*3/uL (ref 0–0.1)
EOS ABS: 0.1 10*3/uL (ref 0–0.7)
Eosinophils Relative: 4 %
Lymphocytes Relative: 41 %
Lymphs Abs: 1.3 10*3/uL (ref 1.0–3.6)
MONOS PCT: 13 %
Monocytes Absolute: 0.4 10*3/uL (ref 0.2–1.0)
NEUTROS ABS: 1.3 10*3/uL — AB (ref 1.4–6.5)
NEUTROS PCT: 41 %

## 2017-07-10 LAB — COMPREHENSIVE METABOLIC PANEL
ALT: 23 U/L (ref 17–63)
AST: 33 U/L (ref 15–41)
Albumin: 4.4 g/dL (ref 3.5–5.0)
Alkaline Phosphatase: 70 U/L (ref 38–126)
Anion gap: 5 (ref 5–15)
BUN: 11 mg/dL (ref 6–20)
CO2: 25 mmol/L (ref 22–32)
Calcium: 8.8 mg/dL — ABNORMAL LOW (ref 8.9–10.3)
Chloride: 107 mmol/L (ref 101–111)
Creatinine, Ser: 0.91 mg/dL (ref 0.61–1.24)
GFR calc Af Amer: >60 mL/min (ref >60)
GFR calc non Af Amer: >60 mL/min (ref >60)
Glucose, Bld: 113 mg/dL — ABNORMAL HIGH (ref 65–99)
Potassium: 3.9 mmol/L (ref 3.5–5.1)
Sodium: 137 mmol/L (ref 135–145)
Total Bilirubin: 1.2 mg/dL (ref 0.3–1.2)
Total Protein: 7.7 g/dL (ref 6.5–8.1)

## 2017-07-10 LAB — CBC
HCT: 38.7 % — ABNORMAL LOW (ref 40.0–52.0)
Hemoglobin: 13.1 g/dL (ref 13.0–18.0)
MCH: 33.4 pg (ref 26.0–34.0)
MCHC: 33.9 g/dL (ref 32.0–36.0)
MCV: 98.6 fL (ref 80.0–100.0)
PLATELETS: 191 10*3/uL (ref 150–440)
RBC: 3.93 MIL/uL — AB (ref 4.40–5.90)
RDW: 14 % (ref 11.5–14.5)
WBC: 3.2 10*3/uL — ABNORMAL LOW (ref 3.8–10.6)

## 2017-07-10 MED ORDER — HYDROXYZINE HCL 10 MG PO TABS: 10.0000 mg | ORAL_TABLET | Freq: Every day | ORAL | 0 refills | Status: DC

## 2017-07-10 MED ORDER — PRAMOXINE HCL 1 % EX LOTN: 1.0000 "application " | TOPICAL_LOTION | Freq: Two times a day (BID) | CUTANEOUS | 0 refills | Status: DC

## 2017-07-10 NOTE — ED Notes (Signed)
See triage note  Presents with possible rash   States he feels like the rash is all over  No visible rash at present

## 2017-07-10 NOTE — ED Triage Notes (Signed)
Patient presents to ED via POV from home due to rash "all over". No rash visible to this RN. Patient reports going to his PCP and being told he has dry skin.

## 2017-07-10 NOTE — ED Provider Notes (Signed)
[facility] Emergency Department Provider Note  ____________________________________________  Time seen: Approximately 8:31 AM  I have reviewed the triage vital signs and the nursing notes.   HISTORY  Chief Complaint Rash    HPI Marc Bradford is a 57 y.o. male that presents to the emergency department for pruritus for 3 weeks.  Patient states that itching is worse between 11 PM and 3 AM.  He does not have any pets.  No insect exposures or bedbugs.  He denies any new laundry detergents, body washes. No new medications. He states he washed his sheets 2 weeks ago without improvement.  His PCP told him that he has dry skin and gave him a prescription for triamcinolone cream.  This is not helping.  No fever, chills, shortness of breath, chest pain, nausea, vomiting.   Past Medical History:  Diagnosis Date  . Gout   . Hyperlipidemia   . Hypertension 09/30/2013  . 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 s/p MDT dual chamber ICD implant 03/2014 followed by Dr Graciela Husbands  . OSA (obstructive sleep apnea) 10/28/2014   Severe with AHI 78/hr now on CPAP  . Stroke (HCC) 09/22/2013   a. non-hemorrhagic b. residual left sided weakness    Patient Active Problem List   Diagnosis Date Noted  . NSVT (nonsustained ventricular tachycardia) (HCC) 03/17/2017  . Pre-operative clearance 03/17/2017  . OSA (obstructive sleep apnea) 10/28/2014  . Morbid obesity (HCC) 10/28/2014  . (HFpEF) heart failure with preserved ejection fraction (HCC) 04/06/2014  . 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 Essential hypertension 09/30/2013  . Hyperlipidemia 09/30/2013  . Left-sided weakness 09/28/2013    Past Surgical History:  Procedure Laterality Date  . IMPLANTABLE CARDIOVERTER DEFIBRILLATOR IMPLANT N/A 04/06/2014   MDT MRI compatible dual chamber ICD implanted by Dr Graciela Husbands    Prior to Admission medications   Medication Sig Start Date End Date Taking? Authorizing  Provider  amLODipine (NORVASC) 5 MG tablet Take 5 mg by mouth daily.    [provider]  aspirin EC 81 MG tablet Take 81 mg by mouth daily.    [provider]  Cholecalciferol (VITAMIN D PO) Take 1 tablet by mouth daily.    [provider]  furosemide (LASIX) 20 MG tablet Take 1 tablet (20 mg total) by mouth daily. 07/22/14   Duke Salvia, MD  guaiFENesin-codeine 100-10 MG/5ML syrup Take 5 mLs by mouth every 6 (six) hours as needed. 04/10/17   Tommi Rumps, PA-C  hydrOXYzine (ATARAX/VISTARIL) 10 MG tablet Take 1 tablet (10 mg total) by mouth at bedtime. 07/10/17   Enid Derry, PA-C  ondansetron (ZOFRAN ODT) 4 MG disintegrating tablet Take 1 tablet (4 mg total) every 8 (eight) hours as needed by mouth for nausea or vomiting. 12/29/16   Arnaldo Natal, MD  pramoxine (SARNA SENSITIVE) 1 % LOTN Apply 1 application topically 2 (two) times daily. 07/10/17   Enid Derry, PA-C  propranolol ER (INDERAL LA) 80 MG 24 hr capsule Take 1 capsule (80 mg total) by mouth daily. 04/07/14   Azalee Course, PA  quinapril (ACCUPRIL) 40 MG tablet Take 40 mg by mouth daily.    [provider]  simvastatin (ZOCOR) 20 MG tablet Take 20 mg by mouth at bedtime.    [provider]    Allergies Patient has no known allergies.  Family History  Problem Relation Age of Onset  . Heart attack Mother   . Sudden death Mother   . Hypertension Mother   .  Sudden death Father   . Diabetes Other        family history  . Drug abuse Brother   . Anemia Neg Hx   . Arrhythmia Neg Hx   . Asthma Neg Hx   . Cancer Neg Hx   . Depression Neg Hx   . Heart failure Neg Hx   . Hyperlipidemia Neg Hx   . Kidney disease Neg Hx   . Thyroid disease Neg Hx   . Fainting Neg Hx   . Stroke Neg Hx     Social History Social History   Tobacco Use  . Smoking status: Never Smoker  . Smokeless tobacco: Current User    Types: Snuff  Substance Use Topics  . Alcohol use: Yes    Alcohol/week:  1.2 oz    Types: 2 Cans of beer per week  . Drug use: No     Review of Systems  Constitutional: No fever/chills Cardiovascular: No chest pain. Respiratory: No SOB. Gastrointestinal: No abdominal pain.  No nausea, no vomiting.  Musculoskeletal: Negative for musculoskeletal pain. Skin: Negative for abrasions, lacerations, ecchymosis. Neurological: Negative for headaches   ____________________________________________   PHYSICAL EXAM:  VITAL SIGNS: ED Triage Vitals [07/10/17 0823]  Enc Vitals Group     BP (!) 170/78     Pulse Rate 60     Resp 16     Temp 97.8 F (36.6 C)     Temp Source Oral     SpO2 99 %     Weight 230 lb (104.3 kg)     Height  (1.676 m)     Head Circumference      Peak Flow      Pain Score 0     Pain Loc      Pain Edu?      Excl. in GC?      Constitutional: Alert and oriented. Well appearing and in no acute distress. Eyes: Conjunctivae are normal. PERRL. EOMI. Head: Atraumatic. ENT:      Ears:      Nose: No congestion/rhinnorhea.      Mouth/Throat: Mucous membranes are moist.  Neck: No stridor.  Cardiovascular: Normal rate, regular rhythm.  Good peripheral circulation. Respiratory: Normal respiratory effort without tachypnea or retractions. Lungs CTAB. Good air entry to the bases with no decreased or absent breath sounds. Musculoskeletal: Full range of motion to all extremities. No gross deformities appreciated. Neurologic:  Normal speech and language. No gross focal neurologic deficits are appreciated.  Skin:  Skin is warm, dry and intact. No rash noted.   ____________________________________________   LABS (all labs ordered are listed, but only abnormal results are displayed)  Labs Reviewed  CBC - Abnormal; Notable for the following components:      Result Value   WBC 3.2 (*)    RBC 3.93 (*)    HCT 38.7 (*)    All other components within normal limits  COMPREHENSIVE METABOLIC PANEL - Abnormal; Notable for the following  components:   Glucose, Bld 113 (*)    Calcium 8.8 (*)    All other components within normal limits  DIFFERENTIAL - Abnormal; Notable for the following components:   Neutro Abs 1.3 (*)    All other components within normal limits   ____________________________________________  EKG   ____________________________________________  RADIOLOGY  No results found.  ____________________________________________    PROCEDURES  Procedure(s) performed:    Procedures    Medications - No data to display   ____________________________________________   INITIAL  IMPRESSION / ASSESSMENT AND PLAN / ED COURSE  Pertinent labs & imaging results that were available during my care of the patient were reviewed by me and considered in my medical decision making (see chart for details).  Review of the [place] CSRS was performed in accordance of the [facility] prior to dispensing any controlled drugs.    Patient presented to the emergency department for pruitis for 1 month. Vital signs and exam are reassuring. Mildly decreased WBC, RBC on CBC. Patient is agreeable to follow up with PCP for recheck of CBC in one week. Itching is only between the hours of 11pm and 3am and patient was encouraged to rewash sheets, pajamas, and check for bed bugs. Patient will be discharged home with prescriptions for atarax and pramoxine. Patient is to follow up with PCP as directed. Patient is given ED precautions to return to the ED for any worsening or new symptoms.     ____________________________________________  FINAL CLINICAL IMPRESSION(S) / ED DIAGNOSES  Final diagnoses:  Itching      NEW MEDICATIONS STARTED DURING THIS VISIT:  ED Discharge Orders        Ordered    hydrOXYzine (ATARAX/VISTARIL) 10 MG tablet  Daily at bedtime     07/10/17 0958    pramoxine (SARNA SENSITIVE) 1 % LOTN  2 times daily     07/10/17 1610          This chart was dictated using voice recognition software/Dragon. Despite  best efforts to proofread, errors can occur which can change the meaning. Any change was purely unintentional.    Enid Derry, PA-C 07/10/17 1538    Don Perking, [place], MD 07/28/17 1017

## 2017-07-10 NOTE — Discharge Instructions (Addendum)
Follow-up with primary care in 1 week for repeat blood work.

## 2017-07-30 ENCOUNTER — Ambulatory Visit (INDEPENDENT_AMBULATORY_CARE_PROVIDER_SITE_OTHER): Payer: Medicare Other | Admitting: *Deleted

## 2017-07-30 DIAGNOSIS — I422 Other hypertrophic cardiomyopathy: Secondary | ICD-10-CM | POA: Diagnosis not present

## 2017-07-30 NOTE — Progress Notes (Signed)
Remote ICD transmission.   

## 2017-07-31 ENCOUNTER — Encounter: Payer: Self-pay | Admitting: Cardiology

## 2017-08-05 LAB — CUP PACEART REMOTE DEVICE CHECK
Brady Statistic AP VP Percent: 0.08 %
Brady Statistic AS VP Percent: 0.03 %
Brady Statistic RA Percent Paced: 35.39 %
HighPow Impedance: 63 Ohm
Implantable Lead Implant Date: 20160217
Implantable Lead Location: 753859
Implantable Pulse Generator Implant Date: 20160217
Lead Channel Impedance Value: 418 Ohm
Lead Channel Impedance Value: 475 Ohm
Lead Channel Pacing Threshold Pulse Width: 0.4 ms
Lead Channel Sensing Intrinsic Amplitude: 2.875 mV
Lead Channel Sensing Intrinsic Amplitude: 31.625 mV
Lead Channel Sensing Intrinsic Amplitude: 31.625 mV
Lead Channel Setting Pacing Amplitude: 2 V
Lead Channel Setting Pacing Pulse Width: 0.4 ms
MDC IDC LEAD IMPLANT DT: 20160217
MDC IDC LEAD LOCATION: 753860
MDC IDC MSMT BATTERY REMAINING LONGEVITY: 91 mo
MDC IDC MSMT BATTERY VOLTAGE: 2.99 V
MDC IDC MSMT LEADCHNL RA PACING THRESHOLD AMPLITUDE: 0.5 V
MDC IDC MSMT LEADCHNL RA PACING THRESHOLD PULSEWIDTH: 0.4 ms
MDC IDC MSMT LEADCHNL RA SENSING INTR AMPL: 2.875 mV
MDC IDC MSMT LEADCHNL RV IMPEDANCE VALUE: 361 Ohm
MDC IDC MSMT LEADCHNL RV PACING THRESHOLD AMPLITUDE: 0.75 V
MDC IDC SESS DTM: 20190612083724
MDC IDC SET LEADCHNL RV PACING AMPLITUDE: 2.5 V
MDC IDC SET LEADCHNL RV SENSING SENSITIVITY: 0.3 mV
MDC IDC STAT BRADY AP VS PERCENT: 35.6 %
MDC IDC STAT BRADY AS VS PERCENT: 64.29 %
MDC IDC STAT BRADY RV PERCENT PACED: 0.13 %

## 2017-08-19 ENCOUNTER — Emergency Department: Payer: No Typology Code available for payment source

## 2017-08-19 ENCOUNTER — Encounter: Payer: Self-pay | Admitting: Emergency Medicine

## 2017-08-19 ENCOUNTER — Emergency Department
Admission: EM | Admit: 2017-08-19 | Discharge: 2017-08-19 | Disposition: A | Discharge status: Home / Self Care | Payer: No Typology Code available for payment source | Attending: Emergency Medicine | Admitting: Emergency Medicine

## 2017-08-19 DIAGNOSIS — F17220 Nicotine dependence, chewing tobacco, uncomplicated: Secondary | ICD-10-CM | POA: Diagnosis not present

## 2017-08-19 DIAGNOSIS — Y999 Unspecified external cause status: Secondary | ICD-10-CM | POA: Diagnosis not present

## 2017-08-19 DIAGNOSIS — M25512 Pain in left shoulder: Secondary | ICD-10-CM | POA: Diagnosis not present

## 2017-08-19 DIAGNOSIS — Z7982 Long term (current) use of aspirin: Secondary | ICD-10-CM | POA: Diagnosis not present

## 2017-08-19 DIAGNOSIS — Z95810 Presence of automatic (implantable) cardiac defibrillator: Secondary | ICD-10-CM | POA: Insufficient documentation

## 2017-08-19 DIAGNOSIS — Y92410 Unspecified street and highway as the place of occurrence of the external cause: Secondary | ICD-10-CM | POA: Insufficient documentation

## 2017-08-19 DIAGNOSIS — I10 Essential (primary) hypertension: Secondary | ICD-10-CM | POA: Insufficient documentation

## 2017-08-19 DIAGNOSIS — Y939 Activity, unspecified: Secondary | ICD-10-CM | POA: Diagnosis not present

## 2017-08-19 DIAGNOSIS — Z8673 Personal history of transient ischemic attack (TIA), and cerebral infarction without residual deficits: Secondary | ICD-10-CM | POA: Insufficient documentation

## 2017-08-19 DIAGNOSIS — R5383 Other fatigue: Secondary | ICD-10-CM | POA: Insufficient documentation

## 2017-08-19 DIAGNOSIS — Z79899 Other long term (current) drug therapy: Secondary | ICD-10-CM | POA: Insufficient documentation

## 2017-08-19 LAB — TROPONIN I

## 2017-08-19 LAB — COMPREHENSIVE METABOLIC PANEL
ALK PHOS: 67 U/L (ref 38–126)
ALT: 25 U/L (ref 0–44)
ANION GAP: 8 (ref 5–15)
AST: 38 U/L (ref 15–41)
Albumin: 4 g/dL (ref 3.5–5.0)
BILIRUBIN TOTAL: 1.5 mg/dL — AB (ref 0.3–1.2)
BUN: 13 mg/dL (ref 6–20)
CO2: 24 mmol/L (ref 22–32)
Calcium: 8.8 mg/dL — ABNORMAL LOW (ref 8.9–10.3)
Chloride: 106 mmol/L (ref 98–111)
Creatinine, Ser: 0.84 mg/dL (ref 0.61–1.24)
GFR calc non Af Amer: >60 mL/min (ref >60)
Glucose, Bld: 106 mg/dL — ABNORMAL HIGH (ref 70–99)
POTASSIUM: 4.2 mmol/L (ref 3.5–5.1)
SODIUM: 138 mmol/L (ref 135–145)
TOTAL PROTEIN: 7.5 g/dL (ref 6.5–8.1)

## 2017-08-19 LAB — CBC
HEMATOCRIT: 40.6 % (ref 40.0–52.0)
HEMOGLOBIN: 13.7 g/dL (ref 13.0–18.0)
MCH: 33.9 pg (ref 26.0–34.0)
MCHC: 33.7 g/dL (ref 32.0–36.0)
MCV: 100.5 fL — ABNORMAL HIGH (ref 80.0–100.0)
Platelets: 167 10*3/uL (ref 150–440)
RBC: 4.04 MIL/uL — ABNORMAL LOW (ref 4.40–5.90)
RDW: 14.4 % (ref 11.5–14.5)
WBC: 3.7 10*3/uL — ABNORMAL LOW (ref 3.8–10.6)

## 2017-08-19 NOTE — ED Notes (Signed)
Pt returned to room from XR at this time.  

## 2017-08-19 NOTE — ED Notes (Signed)
Pt defib was interrogated at this time. Awaiting results.

## 2017-08-19 NOTE — ED Notes (Signed)
This RN received call from [facility] Incylian Cocklear from [employer] in regards to pt interrogation. Device is functioning as programmed and no episodes have been recorded. Fax will sent at this time.
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
## 2017-08-19 NOTE — ED Notes (Signed)
Pt up urinating at this time. Pt is agreeable to d/c. RN educated pt on [facility] appt [facility].

## 2017-08-19 NOTE — ED Notes (Signed)
Patient transported to X-ray 

## 2017-08-19 NOTE — ED Notes (Signed)
.   Pt is resting, Respirations even and unlabored, NAD. Stretcher lowest postion and locked. Call bell within reach. Denies any needs at this time RN will continue to monitor.    
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
## 2017-10-29 ENCOUNTER — Telehealth: Payer: Self-pay

## 2017-10-29 ENCOUNTER — Ambulatory Visit (INDEPENDENT_AMBULATORY_CARE_PROVIDER_SITE_OTHER): Payer: Medicare Other | Admitting: *Deleted

## 2017-10-29 DIAGNOSIS — I422 Other hypertrophic cardiomyopathy: Secondary | ICD-10-CM | POA: Diagnosis not present

## 2017-10-29 NOTE — Telephone Encounter (Signed)
Spoke with pt and reminded pt of remote transmission that is due today. Pt verbalized understanding.   

## 2017-10-30 NOTE — Progress Notes (Signed)
Remote ICD transmission.   

## 2017-11-19 LAB — CUP PACEART REMOTE DEVICE CHECK
Date Time Interrogation Session: 20190912011355
HIGH POWER IMPEDANCE MEASURED VALUE: 70 Ohm
Implantable Lead Implant Date: 20160217
Implantable Lead Location: 753859
Implantable Lead Model: 5076
Implantable Pulse Generator Implant Date: 20160217
Lead Channel Impedance Value: 475 Ohm
Lead Channel Pacing Threshold Amplitude: 0.5 V
Lead Channel Pacing Threshold Amplitude: 0.75 V
Lead Channel Pacing Threshold Pulse Width: 0.4 ms
Lead Channel Sensing Intrinsic Amplitude: 3.375 mV
Lead Channel Sensing Intrinsic Amplitude: 3.375 mV
Lead Channel Sensing Intrinsic Amplitude: 31.625 mV
Lead Channel Setting Pacing Amplitude: 2.5 V
Lead Channel Setting Pacing Pulse Width: 0.4 ms
MDC IDC LEAD IMPLANT DT: 20160217
MDC IDC LEAD LOCATION: 753860
MDC IDC MSMT BATTERY REMAINING LONGEVITY: 86 mo
MDC IDC MSMT BATTERY VOLTAGE: 2.99 V
MDC IDC MSMT LEADCHNL RA PACING THRESHOLD PULSEWIDTH: 0.4 ms
MDC IDC MSMT LEADCHNL RV IMPEDANCE VALUE: 342 Ohm
MDC IDC MSMT LEADCHNL RV IMPEDANCE VALUE: 418 Ohm
MDC IDC MSMT LEADCHNL RV SENSING INTR AMPL: 31.625 mV
MDC IDC SET LEADCHNL RA PACING AMPLITUDE: 2 V
MDC IDC SET LEADCHNL RV SENSING SENSITIVITY: 0.3 mV
MDC IDC STAT BRADY AP VP PERCENT: 0.05 %
MDC IDC STAT BRADY AP VS PERCENT: 24.37 %
MDC IDC STAT BRADY AS VP PERCENT: 0.03 %
MDC IDC STAT BRADY AS VS PERCENT: 75.55 %
MDC IDC STAT BRADY RA PERCENT PACED: 24.27 %
MDC IDC STAT BRADY RV PERCENT PACED: 0.09 %

## 2017-12-07 ENCOUNTER — Other Ambulatory Visit: Payer: Self-pay

## 2017-12-07 ENCOUNTER — Emergency Department
Admission: EM | Admit: 2017-12-07 | Discharge: 2017-12-07 | Disposition: A | Discharge status: Home / Self Care | Payer: Medicare Other | Attending: Student in an Organized Health Care Education/Training Program | Admitting: Student in an Organized Health Care Education/Training Program

## 2017-12-07 ENCOUNTER — Emergency Department: Payer: Medicare Other

## 2017-12-07 DIAGNOSIS — R600 Localized edema: Secondary | ICD-10-CM | POA: Diagnosis not present

## 2017-12-07 DIAGNOSIS — Z7982 Long term (current) use of aspirin: Secondary | ICD-10-CM | POA: Insufficient documentation

## 2017-12-07 DIAGNOSIS — Z95810 Presence of automatic (implantable) cardiac defibrillator: Secondary | ICD-10-CM | POA: Diagnosis not present

## 2017-12-07 DIAGNOSIS — Z8673 Personal history of transient ischemic attack (TIA), and cerebral infarction without residual deficits: Secondary | ICD-10-CM | POA: Diagnosis not present

## 2017-12-07 DIAGNOSIS — M79662 Pain in left lower leg: Secondary | ICD-10-CM | POA: Diagnosis not present

## 2017-12-07 DIAGNOSIS — Z79899 Other long term (current) drug therapy: Secondary | ICD-10-CM | POA: Insufficient documentation

## 2017-12-07 DIAGNOSIS — I10 Essential (primary) hypertension: Secondary | ICD-10-CM | POA: Insufficient documentation

## 2017-12-07 DIAGNOSIS — M79605 Pain in left leg: Secondary | ICD-10-CM

## 2017-12-07 LAB — URINALYSIS, COMPLETE (UACMP) WITH MICROSCOPIC
BACTERIA UA: NONE SEEN
BILIRUBIN URINE: NEGATIVE
Glucose, UA: NEGATIVE mg/dL
KETONES UR: NEGATIVE mg/dL
LEUKOCYTES UA: NEGATIVE
Nitrite: NEGATIVE
PH: 5.5 (ref 5.0–8.0)
Protein, ur: NEGATIVE mg/dL
SPECIFIC GRAVITY, URINE: 1.025 (ref 1.005–1.030)

## 2017-12-07 MED ORDER — CYCLOBENZAPRINE HCL 5 MG PO TABS: 5.0000 mg | ORAL_TABLET | Freq: Three times a day (TID) | ORAL | 0 refills | Status: DC | PRN

## 2017-12-07 MED ORDER — CYCLOBENZAPRINE HCL 10 MG PO TABS
5.0000 mg | ORAL_TABLET | Freq: Once | ORAL | Status: AC
  Administered 2017-12-07: 5 mg via ORAL
  Filled 2017-12-07: qty 1

## 2017-12-07 MED ORDER — ACETAMINOPHEN 500 MG PO TABS
1000.0000 mg | ORAL_TABLET | Freq: Once | ORAL | Status: AC
  Administered 2017-12-07: 1000 mg via ORAL
  Filled 2017-12-07: qty 2

## 2017-12-07 MED ORDER — TRAMADOL HCL 50 MG PO TABS: 50.0000 mg | ORAL_TABLET | Freq: Four times a day (QID) | ORAL | 0 refills | Status: AC | PRN

## 2017-12-07 NOTE — ED Provider Notes (Signed)
[facility] Emergency Department Provider Note    First MD Initiated Contact with Patient 12/07/17 1003     (approximate)  I have reviewed the triage vital signs and the nursing notes.   HISTORY  Chief Complaint Leg Swelling    HPI Marc Bradford is a 57 y.o. male history of gout hypertension CHF presents the ER for worsening swelling in his left leg and achiness in the calf started about 6 days ago.  Denies any trauma.  No fevers.  Has had swelling in the calf before and had noticed some pain there but is never been this severe.  Is now having pain with walking.  Describes it as a mild to moderate achy sensation.  Denies any chest pain, shortness of breath, nausea or vomiting.  Says it does not feel like his gout.  Past Medical History:  Diagnosis Date  . Gout   . Hyperlipidemia   . Hypertension 09/30/2013  . 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 s/p MDT dual chamber ICD implant 03/2014 followed by Dr Graciela Husbands  . OSA (obstructive sleep apnea) 10/28/2014   Severe with AHI 78/hr now on CPAP  . Stroke (HCC) 09/22/2013   a. non-hemorrhagic b. residual left sided weakness   Family History  Problem Relation Age of Onset  . Heart attack Mother   . Sudden death Mother   . Hypertension Mother   . Sudden death Father   . Diabetes Other        family history  . Drug abuse Brother   . Anemia Neg Hx   . Arrhythmia Neg Hx   . Asthma Neg Hx   . Cancer Neg Hx   . Depression Neg Hx   . Heart failure Neg Hx   . Hyperlipidemia Neg Hx   . Kidney disease Neg Hx   . Thyroid disease Neg Hx   . Fainting Neg Hx   . Stroke Neg Hx    Past Surgical History:  Procedure Laterality Date  . IMPLANTABLE CARDIOVERTER DEFIBRILLATOR IMPLANT N/A 04/06/2014   MDT MRI compatible dual chamber ICD implanted by Dr Graciela Husbands   Patient Active Problem List   Diagnosis Date Noted  . NSVT (nonsustained ventricular tachycardia) (HCC) 03/17/2017  . Pre-operative clearance 03/17/2017    . OSA (obstructive sleep apnea) 10/28/2014  . Morbid obesity (HCC) 10/28/2014  . (HFpEF) heart failure with preserved ejection fraction (HCC) 04/06/2014  . 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 Essential hypertension 09/30/2013  . Hyperlipidemia 09/30/2013  . Left-sided weakness 09/28/2013      Prior to Admission medications   Medication Sig Start Date End Date Taking? Authorizing Provider  amLODipine (NORVASC) 5 MG tablet Take 5 mg by mouth daily.    [provider]  aspirin EC 81 MG tablet Take 81 mg by mouth daily.    [provider]  Cholecalciferol (VITAMIN D PO) Take 1 tablet by mouth daily.    [provider]  cyclobenzaprine (FLEXERIL) 5 MG tablet Take 1 tablet (5 mg total) by mouth 3 (three) times daily as needed for muscle spasms. 12/07/17   Willy Eddy, MD  furosemide (LASIX) 20 MG tablet Take 1 tablet (20 mg total) by mouth daily. 07/22/14   Duke Salvia, MD  guaiFENesin-codeine 100-10 MG/5ML syrup Take 5 mLs by mouth every 6 (six) hours as needed. 04/10/17   Tommi Rumps, PA-C  hydrOXYzine (ATARAX/VISTARIL) 10 MG tablet Take 1 tablet (10 mg total) by mouth at bedtime. 07/10/17  Enid Derry, PA-C  ondansetron (ZOFRAN ODT) 4 MG disintegrating tablet Take 1 tablet (4 mg total) every 8 (eight) hours as needed by mouth for nausea or vomiting. 12/29/16   Arnaldo Natal, MD  pramoxine (SARNA SENSITIVE) 1 % LOTN Apply 1 application topically 2 (two) times daily. 07/10/17   Enid Derry, PA-C  propranolol ER (INDERAL LA) 80 MG 24 hr capsule Take 1 capsule (80 mg total) by mouth daily. 04/07/14   Azalee Course, PA  quinapril (ACCUPRIL) 40 MG tablet Take 40 mg by mouth daily.    [provider]  simvastatin (ZOCOR) 20 MG tablet Take 20 mg by mouth at bedtime.    [provider]  traMADol (ULTRAM) 50 MG tablet Take 1 tablet (50 mg total) by mouth every 6 (six) hours as needed. 12/07/17 12/07/18  Willy Eddy, MD    Allergies Patient has no known allergies.    Social History Social History   Tobacco Use  . Smoking status: Never Smoker  . Smokeless tobacco: Current User    Types: Snuff  Substance Use Topics  . Alcohol use: Yes    Alcohol/week: 2.0 standard drinks    Types: 2 Cans of beer per week  . Drug use: No    Review of Systems Patient denies headaches, rhinorrhea, blurry vision, numbness, shortness of breath, chest pain, edema, cough, abdominal pain, nausea, vomiting, diarrhea, dysuria, fevers, rashes or hallucinations unless otherwise stated above in HPI. ____________________________________________   PHYSICAL EXAM:  VITAL SIGNS: Vitals:   12/07/17 0957  BP: 131/82  Pulse: 70  Resp: 16  Temp: 98.1 F (36.7 C)  SpO2: 96%    Constitutional: Alert and oriented. Well appearing and in no acute distress. Eyes: Conjunctivae are normal.  Head: Atraumatic. Nose: No congestion/rhinnorhea. Mouth/Throat: Mucous membranes are moist.   Neck: Painless ROM.  Cardiovascular:   Good peripheral circulation. Respiratory: Normal respiratory effort.  No retractions.  Gastrointestinal: Soft and nontender.  Musculoskeletal: 1+ pitting edema to the left leg greater than the right. ttp in posterior claf without erythema There is some tenderness to palpation in the posterior calf but no fluctuance or warmth to suggest abscess.  Strong palpable DP and PT pulses.  No evidence of erythema, crepitus or blistering.  No joint effusions. Neurologic:  Normal speech and language. No gross focal neurologic deficits are appreciated.  Skin:  Skin is warm, dry and intact. No rash noted. Psychiatric: Mood and affect are normal. Speech and behavior are normal.  ____________________________________________   LABS (all labs ordered are listed, but only abnormal results are displayed)  Results for orders placed or performed during the hospital encounter of 12/07/17 (from the past 24 hour(s))    Urinalysis, Complete w Microscopic     Status: Abnormal   Collection Time: 12/07/17 11:01 AM  Result Value Ref Range   Color, Urine YELLOW YELLOW   APPearance CLEAR CLEAR   Specific Gravity, Urine 1.025 1.005 - 1.030   pH 5.5 5.0 - 8.0   Glucose, UA NEGATIVE NEGATIVE mg/dL   Hgb urine dipstick TRACE (A) NEGATIVE   Bilirubin Urine NEGATIVE NEGATIVE   Ketones, ur NEGATIVE NEGATIVE mg/dL   Protein, ur NEGATIVE NEGATIVE mg/dL   Nitrite NEGATIVE NEGATIVE   Leukocytes, UA NEGATIVE NEGATIVE   Squamous Epithelial / LPF 0-5 0 - 5   WBC, UA 0-5 0 - 5 WBC/hpf   RBC / HPF 0-5 0 - 5 RBC/hpf   Bacteria, UA NONE SEEN NONE SEEN   Mucus PRESENT  ____________________________________________  ____________________________________________  RADIOLOGY  I personally reviewed all radiographic images ordered to evaluate for the above acute complaints and reviewed radiology reports and findings.  These findings were personally discussed with the patient.  Please see medical record for radiology report.  ____________________________________________   PROCEDURES  Procedure(s) performed:  Procedures    Critical Care performed: no ____________________________________________   INITIAL IMPRESSION / ASSESSMENT AND PLAN / ED COURSE  Pertinent labs & imaging results that were available during my care of the patient were reviewed by me and considered in my medical decision making (see chart for details).  DDX: dvt, bakers cyst, msk strain, celluliitis  Larson Limones is a 57 y.o. who presents to the ED with leg pain and swelling as described above.  No evidence of infectious process.  No evidence of ischemic limb.  Will order ultrasound to evaluate for DVT was Baker's cyst.  No neuro deficits.  Clinical Course as of Dec 07 1245  Wynelle Link Dec 07, 2017  1205 Lower extremity ultrasound does not show any evidence of DVT.  There is a cystic structure related to area of pain.  Certainly no evidence of  abscess.  Repeat bedside ultrasound does seem more clinically consistent with possible muscle injury or hematoma.  Negative Doppler signals.  No vascularity.  Do not feel that emergent MRI clinically indicated based on patient's mild symptoms and this is most likely muscle strain.  Patient does have outpatient follow-up I think is appropriate for trial of conservative management is not having any other associated symptoms and will also be given referral to orthopedics.   [PR]    Clinical Course User Index [PR] Willy Eddy, MD     ____________________________________________   FINAL CLINICAL IMPRESSION(S) / ED DIAGNOSES  Final diagnoses:  Left leg pain      NEW MEDICATIONS STARTED DURING THIS VISIT:  New Prescriptions   CYCLOBENZAPRINE (FLEXERIL) 5 MG TABLET    Take 1 tablet (5 mg total) by mouth 3 (three) times daily as needed for muscle spasms.   TRAMADOL (ULTRAM) 50 MG TABLET    Take 1 tablet (50 mg total) by mouth every 6 (six) hours as needed.     Note:  This document was prepared using Dragon voice recognition software and may include unintentional dictation errors.     Willy Eddy, MD 12/07/17 1247

## 2017-12-07 NOTE — ED Triage Notes (Signed)
L leg pain and swelling x 6 days. Takes ASA, no blood thinners. Denies injury. Has been walking on it. Pain in calf, not behind knee. A&O x 4, no distress noted.

## 2017-12-07 NOTE — ED Notes (Signed)
VO for [place] of L leg from Dr. Roxan Hockey, no blood work at this time.

## 2018-01-28 ENCOUNTER — Ambulatory Visit (INDEPENDENT_AMBULATORY_CARE_PROVIDER_SITE_OTHER): Payer: Medicare Other

## 2018-01-28 DIAGNOSIS — I422 Other hypertrophic cardiomyopathy: Secondary | ICD-10-CM

## 2018-01-28 DIAGNOSIS — I5022 Chronic systolic (congestive) heart failure: Secondary | ICD-10-CM

## 2018-01-29 NOTE — Progress Notes (Signed)
Remote ICD transmission.   

## 2018-01-30 ENCOUNTER — Encounter: Payer: Self-pay | Admitting: Cardiology

## 2018-02-26 ENCOUNTER — Telehealth: Payer: Self-pay | Admitting: Internal Medicine

## 2018-02-26 NOTE — Telephone Encounter (Signed)
Pt has not seen Dr Graciela Husbands in over a year. He understands he needs a follow up appt before any letter can be written. Pt has been scheduled for an OV on 1/31; agrees with plan.

## 2018-02-26 NOTE — Telephone Encounter (Signed)
New Message   Patient is calling because he is needing a letter stating that it is more beneficial for him to have a downstairs apartment due to his heart condition. Please call to discuss.

## 2018-03-08 LAB — CUP PACEART REMOTE DEVICE CHECK
Brady Statistic AP VP Percent: 0.05 %
Brady Statistic AS VP Percent: 0.04 %
Brady Statistic AS VS Percent: 78.17 %
Brady Statistic RV Percent Paced: 0.1 %
Date Time Interrogation Session: 20191211072503
HIGH POWER IMPEDANCE MEASURED VALUE: 68 Ohm
Implantable Lead Implant Date: 20160217
Implantable Lead Implant Date: 20160217
Implantable Lead Location: 753859
Lead Channel Impedance Value: 342 Ohm
Lead Channel Impedance Value: 475 Ohm
Lead Channel Pacing Threshold Amplitude: 0.5 V
Lead Channel Pacing Threshold Pulse Width: 0.4 ms
Lead Channel Sensing Intrinsic Amplitude: 31.625 mV
Lead Channel Setting Pacing Amplitude: 2 V
Lead Channel Setting Pacing Amplitude: 2.5 V
Lead Channel Setting Sensing Sensitivity: 0.3 mV
MDC IDC LEAD LOCATION: 753860
MDC IDC MSMT BATTERY REMAINING LONGEVITY: 81 mo
MDC IDC MSMT BATTERY VOLTAGE: 2.99 V
MDC IDC MSMT LEADCHNL RA SENSING INTR AMPL: 2.625 mV
MDC IDC MSMT LEADCHNL RA SENSING INTR AMPL: 2.625 mV
MDC IDC MSMT LEADCHNL RV IMPEDANCE VALUE: 456 Ohm
MDC IDC MSMT LEADCHNL RV PACING THRESHOLD AMPLITUDE: 0.75 V
MDC IDC MSMT LEADCHNL RV PACING THRESHOLD PULSEWIDTH: 0.4 ms
MDC IDC MSMT LEADCHNL RV SENSING INTR AMPL: 31.625 mV
MDC IDC PG IMPLANT DT: 20160217
MDC IDC SET LEADCHNL RV PACING PULSEWIDTH: 0.4 ms
MDC IDC STAT BRADY AP VS PERCENT: 21.73 %
MDC IDC STAT BRADY RA PERCENT PACED: 21.67 %

## 2018-03-20 ENCOUNTER — Encounter (INDEPENDENT_AMBULATORY_CARE_PROVIDER_SITE_OTHER): Payer: Self-pay

## 2018-03-20 ENCOUNTER — Ambulatory Visit (INDEPENDENT_AMBULATORY_CARE_PROVIDER_SITE_OTHER): Payer: Medicare HMO | Admitting: Internal Medicine

## 2018-03-20 ENCOUNTER — Encounter: Payer: Self-pay | Admitting: Internal Medicine

## 2018-03-20 VITALS — BP 124/86 | HR 70 | Ht 66.0 in | Wt 230.4 lb

## 2018-03-20 DIAGNOSIS — Z95810 Presence of automatic (implantable) cardiac defibrillator: Secondary | ICD-10-CM

## 2018-03-20 DIAGNOSIS — I5022 Chronic systolic (congestive) heart failure: Secondary | ICD-10-CM

## 2018-03-20 DIAGNOSIS — I422 Other hypertrophic cardiomyopathy: Secondary | ICD-10-CM

## 2018-03-20 NOTE — Patient Instructions (Signed)

## 2018-03-20 NOTE — Progress Notes (Signed)
Patient Care Team: Oswaldo Conroy, MD as PCP - General (Family Medicine) Duke Salvia, MD as PCP - Cardiology (Cardiology)   HPI  Marc Bradford is a 58 y.o. male Seen in follow-up for apical hypertrophic cardiomyopathy. MRI demonstrates significant gadolinium enhancement. He had nonsustained ventricular tachycardia and underwent primary prevention ICD.    He has a history of a prior stroke with residual weakness as well as hypertension    The patient denies chest pain, shortness of breath, nocturnal dyspnea, orthopnea or peripheral edema.  There have been no palpitations, lightheadedness or syncope.    Date Cr K  10/17 0.8 3.4  7/19  0.84 4.2     Past Medical History:  Diagnosis Date  . Gout   . Hyperlipidemia   . Hypertension 09/30/2013  . 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 s/p MDT dual chamber ICD implant 03/2014 followed by Dr Graciela Husbands  . OSA (obstructive sleep apnea) 10/28/2014   Severe with AHI 78/hr now on CPAP  . Stroke (HCC) 09/22/2013   a. non-hemorrhagic b. residual left sided weakness    Past Surgical History:  Procedure Laterality Date  . IMPLANTABLE CARDIOVERTER DEFIBRILLATOR IMPLANT N/A 04/06/2014   MDT MRI compatible dual chamber ICD implanted by Dr Graciela Husbands    Current Outpatient Medications  Medication Sig Dispense Refill  . amLODipine (NORVASC) 5 MG tablet Take 5 mg by mouth daily.    Marland Kitchen aspirin EC 81 MG tablet Take 81 mg by mouth daily.    . Cholecalciferol (VITAMIN D PO) Take 1 tablet by mouth daily.    . cyclobenzaprine (FLEXERIL) 5 MG tablet Take 1 tablet (5 mg total) by mouth 3 (three) times daily as needed for muscle spasms. 12 tablet 0  . furosemide (LASIX) 20 MG tablet Take 1 tablet (20 mg total) by mouth daily. 30 tablet 3  . propranolol ER (INDERAL LA) 80 MG 24 hr capsule Take 1 capsule (80 mg total) by mouth daily. 30 capsule 5  . quinapril (ACCUPRIL) 40 MG tablet Take 40 mg by mouth daily.    . simvastatin (ZOCOR) 20 MG  tablet Take 20 mg by mouth at bedtime.    . traMADol (ULTRAM) 50 MG tablet Take 1 tablet (50 mg total) by mouth every 6 (six) hours as needed. 10 tablet 0   No current facility-administered medications for this visit.     No Known Allergies    Review of Systems negative except from HPI and PMH  Physical Exam BP 124/86   Pulse 70   Ht 5\' 6"  (1.676 m)   Wt 230 lb 6.4 oz (104.5 kg)   SpO2 97%   BMI 37.19 kg/m  Well developed and nourished in no acute distress HENT normal Neck supple with JVP-flat Clear Regular rate and rhythm, no murmurs or gallops Abd-soft with active BS No Clubbing cyanosis edema Skin-warm and dry A & Oriented  Grossly normal sensory and motor function   ECG sinsu @ 70 23/11/46   Assessment and  Plan  HCM-atypical variant  VT nonsustained  ICD Medtronic The patient's device was interrogated.  The information was reviewed. No changes were made in the programming.     HFpEF  Morbidly obese  Hypertension  Grief    No intercurrent Ventricular tachycardia  Euvolemic continue current meds  Wfie died a vyear ago   Struggling but holding on  Now involved in church   Given his cardiovascular disease and prior stroke he is requested a  letter for a ground floor dwelling.     Current medicines are reviewed at length with the patient today .  The patient does not  have concerns regarding medicines.

## 2018-03-23 LAB — CUP PACEART INCLINIC DEVICE CHECK
Battery Remaining Longevity: 78 mo
Battery Voltage: 2.99 V
Brady Statistic AS VP Percent: 0.04 %
Brady Statistic RV Percent Paced: 0.1 %
Date Time Interrogation Session: 20200131173728
HIGH POWER IMPEDANCE MEASURED VALUE: 68 Ohm
Implantable Lead Implant Date: 20160217
Implantable Lead Implant Date: 20160217
Implantable Lead Location: 753859
Implantable Lead Location: 753860
Implantable Lead Model: 5076
Implantable Pulse Generator Implant Date: 20160217
Lead Channel Impedance Value: 361 Ohm
Lead Channel Pacing Threshold Amplitude: 0.5 V
Lead Channel Pacing Threshold Pulse Width: 0.4 ms
Lead Channel Setting Pacing Amplitude: 2 V
Lead Channel Setting Pacing Amplitude: 2.5 V
Lead Channel Setting Sensing Sensitivity: 0.3 mV
MDC IDC MSMT LEADCHNL RA IMPEDANCE VALUE: 475 Ohm
MDC IDC MSMT LEADCHNL RA SENSING INTR AMPL: 1.125 mV
MDC IDC MSMT LEADCHNL RA SENSING INTR AMPL: 3.625 mV
MDC IDC MSMT LEADCHNL RV IMPEDANCE VALUE: 456 Ohm
MDC IDC MSMT LEADCHNL RV PACING THRESHOLD AMPLITUDE: 0.625 V
MDC IDC MSMT LEADCHNL RV PACING THRESHOLD PULSEWIDTH: 0.4 ms
MDC IDC MSMT LEADCHNL RV SENSING INTR AMPL: 31.625 mV
MDC IDC MSMT LEADCHNL RV SENSING INTR AMPL: 31.625 mV
MDC IDC SET LEADCHNL RV PACING PULSEWIDTH: 0.4 ms
MDC IDC STAT BRADY AP VP PERCENT: 0.05 %
MDC IDC STAT BRADY AP VS PERCENT: 23.75 %
MDC IDC STAT BRADY AS VS PERCENT: 76.16 %
MDC IDC STAT BRADY RA PERCENT PACED: 23.67 %

## 2018-04-13 ENCOUNTER — Emergency Department
Admission: EM | Admit: 2018-04-13 | Discharge: 2018-04-13 | Discharge status: Against Medical Advice | Payer: Medicare HMO | Attending: Emergency Medicine | Admitting: Emergency Medicine

## 2018-04-13 ENCOUNTER — Encounter: Payer: Self-pay | Admitting: Intensive Care

## 2018-04-13 ENCOUNTER — Other Ambulatory Visit: Payer: Self-pay

## 2018-04-13 DIAGNOSIS — Z79899 Other long term (current) drug therapy: Secondary | ICD-10-CM | POA: Diagnosis not present

## 2018-04-13 DIAGNOSIS — I10 Essential (primary) hypertension: Secondary | ICD-10-CM | POA: Insufficient documentation

## 2018-04-13 DIAGNOSIS — M79605 Pain in left leg: Secondary | ICD-10-CM | POA: Diagnosis not present

## 2018-04-13 DIAGNOSIS — F17220 Nicotine dependence, chewing tobacco, uncomplicated: Secondary | ICD-10-CM | POA: Insufficient documentation

## 2018-04-13 DIAGNOSIS — Z7982 Long term (current) use of aspirin: Secondary | ICD-10-CM | POA: Insufficient documentation

## 2018-04-13 NOTE — ED Triage Notes (Signed)
PAtient c/o left leg pain that starts in thigh and radiates down to calf. Reports being told he has muscle spasms in the past. Ambulatory into triage with no problems

## 2018-04-13 NOTE — ED Provider Notes (Signed)
[facility] Emergency Department Provider Note  ____________________________________________   First MD Initiated Contact with Patient 04/13/18 1059     (approximate)  I have reviewed the triage vital signs and the nursing notes.   HISTORY  Chief Complaint Leg Pain (left)   HPI Marc Bradford is a 58 y.o. male   presents to the ED with complaint of left leg pain that began yesterday.  Patient denies any recent injury to his leg.  He states that he has had problems with muscle spasms in the past and was treated with muscle relaxants.  Patient continues to be ambulatory without any assistance.  He denies any previous DVTs or recent travel.  He states that he took his daughter's muscle relaxant last evening with some relief.  He rates his pain as a 5/10.   Past Medical History:  Diagnosis Date  . Gout   . Hyperlipidemia   . Hypertension 09/30/2013  . 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 s/p MDT dual chamber ICD implant 03/2014 followed by Dr Graciela Husbands  . OSA (obstructive sleep apnea) 10/28/2014   Severe with AHI 78/hr now on CPAP  . Stroke (HCC) 09/22/2013   a. non-hemorrhagic b. residual left sided weakness    Patient Active Problem List   Diagnosis Date Noted  . NSVT (nonsustained ventricular tachycardia) (HCC) 03/17/2017  . Pre-operative clearance 03/17/2017  . OSA (obstructive sleep apnea) 10/28/2014  . Morbid obesity (HCC) 10/28/2014  . (HFpEF) heart failure with preserved ejection fraction (HCC) 04/06/2014  . 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 Essential hypertension 09/30/2013  . Hyperlipidemia 09/30/2013  . Left-sided weakness 09/28/2013    Past Surgical History:  Procedure Laterality Date  . IMPLANTABLE CARDIOVERTER DEFIBRILLATOR IMPLANT N/A 04/06/2014   MDT MRI compatible dual chamber ICD implanted by Dr Graciela Husbands    Prior to Admission medications   Medication Sig Start Date End Date Taking? Authorizing  Provider  amLODipine (NORVASC) 5 MG tablet Take 5 mg by mouth daily.    [provider]  aspirin EC 81 MG tablet Take 81 mg by mouth daily.    [provider]  Cholecalciferol (VITAMIN D PO) Take 1 tablet by mouth daily.    [provider]  cyclobenzaprine (FLEXERIL) 5 MG tablet Take 1 tablet (5 mg total) by mouth 3 (three) times daily as needed for muscle spasms. 12/07/17   Willy Eddy, MD  furosemide (LASIX) 20 MG tablet Take 1 tablet (20 mg total) by mouth daily. 07/22/14   Duke Salvia, MD  propranolol ER (INDERAL LA) 80 MG 24 hr capsule Take 1 capsule (80 mg total) by mouth daily. 04/07/14   Azalee Course, PA  quinapril (ACCUPRIL) 40 MG tablet Take 40 mg by mouth daily.    [provider]  simvastatin (ZOCOR) 20 MG tablet Take 20 mg by mouth at bedtime.    [provider]  traMADol (ULTRAM) 50 MG tablet Take 1 tablet (50 mg total) by mouth every 6 (six) hours as needed. 12/07/17 12/07/18  Willy Eddy, MD    Allergies Patient has no known allergies.  Family History  Problem Relation Age of Onset  . Heart attack Mother   . Sudden death Mother   . Hypertension Mother   . Sudden death Father   . Diabetes Other        family history  . Drug abuse Brother   . Anemia Neg Hx   . Arrhythmia Neg Hx   . Asthma Neg  Hx   . Cancer Neg Hx   . Depression Neg Hx   . Heart failure Neg Hx   . Hyperlipidemia Neg Hx   . Kidney disease Neg Hx   . Thyroid disease Neg Hx   . Fainting Neg Hx   . Stroke Neg Hx     Social History Social History   Tobacco Use  . Smoking status: Never Smoker  . Smokeless tobacco: Current User    Types: Snuff  Substance Use Topics  . Alcohol use: Yes    Alcohol/week: 14.0 standard drinks    Types: 14 Cans of beer per week  . Drug use: No    Review of Systems Constitutional: No fever/chills Cardiovascular: Denies chest pain. Respiratory: Denies shortness of breath. Gastrointestinal:  No nausea, no  vomiting.  Musculoskeletal: Positive for left leg pain. Skin: Negative for rash. Neurological: Negative for headaches, focal weakness or numbness. ___________________________________________   PHYSICAL EXAM:  VITAL SIGNS: ED Triage Vitals  Enc Vitals Group     BP 04/13/18 1012 (!) 156/74     Pulse Rate 04/13/18 1012 74     Resp 04/13/18 1012 16     Temp 04/13/18 1012 98.5 F (36.9 C)     Temp Source 04/13/18 1012 Oral     SpO2 04/13/18 1012 98 %     Weight 04/13/18 1013 230 lb (104.3 kg)     Height 04/13/18 1013 5\' 6"  (1.676 m)     Head Circumference --      Peak Flow --      Pain Score 04/13/18 1013 5     Pain Loc --      Pain Edu? --      Excl. in GC? --    Constitutional: Alert and oriented. Well appearing and in no acute distress. Eyes: Conjunctivae are normal.  Head: Atraumatic. Neck: No stridor.   Cardiovascular: Normal rate, regular rhythm. Grossly normal heart sounds.  Good peripheral circulation. Respiratory: Normal respiratory effort.  No retractions. Lungs CTAB. Musculoskeletal: On examination of the left lower extremity there is no gross deformity and no discoloration of the skin.  Homans sign was negative.  Good muscle strength bilaterally.  Patient is ambulatory without any assistance.  There is some diffuse tenderness on palpation of the left lower extremity posteriorly.  No soft tissue pitting edema present. Neurologic:  Normal speech and language. No gross focal neurologic deficits are appreciated. No gait instability. Skin:  Skin is warm, dry and intact. No rash noted. Psychiatric: Mood and affect are normal. Speech and behavior are normal.  ____________________________________________   LABS (all labs ordered are listed, but only abnormal results are displayed)  Labs Reviewed  BASIC METABOLIC PANEL    PROCEDURES  Procedure(s) performed (including Critical Care):  Procedures   ____________________________________________   INITIAL IMPRESSION  / ASSESSMENT AND PLAN / ED COURSE  As part of my medical decision making, I reviewed the following data within the electronic MEDICAL RECORD NUMBER Notes from prior ED visits and [place] Controlled Substance Database  Patient presents to the ED with complaint of left leg pain x1 day.  Patient states that he has been diagnosed in the past with muscle spasms and was taking his daughters muscle relaxant.  He denied any history of DVT, recent injury or travel.  Patient currently takes Lasix as 1 of his medications.  Patient announced that he had to leave to pick up someone.  He states that he is unable to find anyone to do this  area and for him.  He left prior to any lab work being done.  He states that he will come back later.  Patient left AMA.  ____________________________________________   FINAL CLINICAL IMPRESSION(S) / ED DIAGNOSES  Final diagnoses:  Left leg pain     ED Discharge Orders    None       Note:  This document was prepared using Dragon voice recognition software and may include unintentional dictation errors.    Tommi Rumps, PA-C 04/13/18 1542    Emily Filbert, MD 04/14/18 [id number]

## 2018-04-15 ENCOUNTER — Ambulatory Visit
Admission: RE | Admit: 2018-04-15 | Discharge: 2018-04-15 | Disposition: A | Discharge status: Home / Self Care | Payer: Medicare HMO | Source: Ambulatory Visit | Attending: Family Medicine | Admitting: Family Medicine

## 2018-04-15 ENCOUNTER — Other Ambulatory Visit: Payer: Self-pay | Admitting: Family Medicine

## 2018-04-15 ENCOUNTER — Other Ambulatory Visit (HOSPITAL_COMMUNITY): Payer: Self-pay | Admitting: Family Medicine

## 2018-04-15 DIAGNOSIS — R609 Edema, unspecified: Secondary | ICD-10-CM | POA: Diagnosis present

## 2018-04-15 DIAGNOSIS — M79662 Pain in left lower leg: Secondary | ICD-10-CM | POA: Diagnosis not present

## 2018-04-29 ENCOUNTER — Ambulatory Visit (INDEPENDENT_AMBULATORY_CARE_PROVIDER_SITE_OTHER): Payer: Medicare HMO | Admitting: *Deleted

## 2018-04-29 ENCOUNTER — Other Ambulatory Visit: Payer: Self-pay

## 2018-04-29 DIAGNOSIS — I5022 Chronic systolic (congestive) heart failure: Secondary | ICD-10-CM

## 2018-04-29 DIAGNOSIS — I422 Other hypertrophic cardiomyopathy: Secondary | ICD-10-CM | POA: Diagnosis not present

## 2018-04-30 LAB — CUP PACEART REMOTE DEVICE CHECK
Battery Remaining Longevity: 76 mo
Battery Voltage: 2.99 V
Brady Statistic AP VP Percent: 0.04 %
Brady Statistic AS VS Percent: 77.08 %
Brady Statistic RA Percent Paced: 22.8 %
Brady Statistic RV Percent Paced: 0.09 %
Date Time Interrogation Session: 20200311083623
HIGH POWER IMPEDANCE MEASURED VALUE: 67 Ohm
Implantable Lead Implant Date: 20160217
Implantable Lead Implant Date: 20160217
Implantable Lead Location: 753860
Implantable Lead Model: 5076
Implantable Pulse Generator Implant Date: 20160217
Lead Channel Impedance Value: 361 Ohm
Lead Channel Impedance Value: 456 Ohm
Lead Channel Impedance Value: 456 Ohm
Lead Channel Pacing Threshold Amplitude: 0.625 V
Lead Channel Pacing Threshold Pulse Width: 0.4 ms
Lead Channel Pacing Threshold Pulse Width: 0.4 ms
Lead Channel Sensing Intrinsic Amplitude: 31.625 mV
Lead Channel Setting Pacing Amplitude: 2 V
Lead Channel Setting Sensing Sensitivity: 0.3 mV
MDC IDC LEAD LOCATION: 753859
MDC IDC MSMT LEADCHNL RA SENSING INTR AMPL: 3.25 mV
MDC IDC MSMT LEADCHNL RA SENSING INTR AMPL: 3.25 mV
MDC IDC MSMT LEADCHNL RV PACING THRESHOLD AMPLITUDE: 0.625 V
MDC IDC MSMT LEADCHNL RV SENSING INTR AMPL: 31.625 mV
MDC IDC SET LEADCHNL RV PACING AMPLITUDE: 2.5 V
MDC IDC SET LEADCHNL RV PACING PULSEWIDTH: 0.4 ms
MDC IDC STAT BRADY AP VS PERCENT: 22.84 %
MDC IDC STAT BRADY AS VP PERCENT: 0.04 %

## 2018-05-06 NOTE — Progress Notes (Signed)
Remote ICD transmission.   

## 2018-07-29 ENCOUNTER — Ambulatory Visit (INDEPENDENT_AMBULATORY_CARE_PROVIDER_SITE_OTHER): Payer: Medicare HMO | Admitting: *Deleted

## 2018-07-29 DIAGNOSIS — I5022 Chronic systolic (congestive) heart failure: Secondary | ICD-10-CM

## 2018-07-29 DIAGNOSIS — I422 Other hypertrophic cardiomyopathy: Secondary | ICD-10-CM | POA: Diagnosis not present

## 2018-07-29 LAB — CUP PACEART REMOTE DEVICE CHECK
Battery Remaining Longevity: 69 mo
Battery Voltage: 2.99 V
Brady Statistic AP VP Percent: 0.08 %
Brady Statistic AP VS Percent: 22.54 %
Brady Statistic AS VP Percent: 0.04 %
Brady Statistic AS VS Percent: 77.34 %
Brady Statistic RA Percent Paced: 22.49 %
Brady Statistic RV Percent Paced: 0.14 %
Date Time Interrogation Session: 20200610041702
HighPow Impedance: 60 Ohm
Implantable Lead Implant Date: 20160217
Implantable Lead Implant Date: 20160217
Implantable Lead Location: 753859
Implantable Lead Location: 753860
Implantable Lead Model: 5076
Implantable Pulse Generator Implant Date: 20160217
Lead Channel Impedance Value: 342 Ohm
Lead Channel Impedance Value: 418 Ohm
Lead Channel Impedance Value: 456 Ohm
Lead Channel Pacing Threshold Amplitude: 0.625 V
Lead Channel Pacing Threshold Amplitude: 0.75 V
Lead Channel Pacing Threshold Pulse Width: 0.4 ms
Lead Channel Pacing Threshold Pulse Width: 0.4 ms
Lead Channel Sensing Intrinsic Amplitude: 2.875 mV
Lead Channel Sensing Intrinsic Amplitude: 2.875 mV
Lead Channel Sensing Intrinsic Amplitude: 31.625 mV
Lead Channel Sensing Intrinsic Amplitude: 31.625 mV
Lead Channel Setting Pacing Amplitude: 2 V
Lead Channel Setting Pacing Amplitude: 2.5 V
Lead Channel Setting Pacing Pulse Width: 0.4 ms
Lead Channel Setting Sensing Sensitivity: 0.3 mV

## 2018-08-07 ENCOUNTER — Encounter: Payer: Self-pay | Admitting: Cardiology

## 2018-08-07 NOTE — Progress Notes (Signed)
Remote ICD transmission.   

## 2018-10-07 ENCOUNTER — Other Ambulatory Visit: Payer: Self-pay

## 2018-10-07 DIAGNOSIS — Z79899 Other long term (current) drug therapy: Secondary | ICD-10-CM | POA: Diagnosis not present

## 2018-10-07 DIAGNOSIS — F17228 Nicotine dependence, chewing tobacco, with other nicotine-induced disorders: Secondary | ICD-10-CM | POA: Diagnosis not present

## 2018-10-07 DIAGNOSIS — Z8673 Personal history of transient ischemic attack (TIA), and cerebral infarction without residual deficits: Secondary | ICD-10-CM | POA: Insufficient documentation

## 2018-10-07 DIAGNOSIS — Y906 Blood alcohol level of 120-199 mg/100 ml: Secondary | ICD-10-CM | POA: Diagnosis not present

## 2018-10-07 DIAGNOSIS — F1014 Alcohol abuse with alcohol-induced mood disorder: Secondary | ICD-10-CM | POA: Diagnosis not present

## 2018-10-07 DIAGNOSIS — Z7982 Long term (current) use of aspirin: Secondary | ICD-10-CM | POA: Insufficient documentation

## 2018-10-07 DIAGNOSIS — F10929 Alcohol use, unspecified with intoxication, unspecified: Secondary | ICD-10-CM | POA: Diagnosis not present

## 2018-10-07 DIAGNOSIS — I10 Essential (primary) hypertension: Secondary | ICD-10-CM | POA: Insufficient documentation

## 2018-10-07 DIAGNOSIS — Z046 Encounter for general psychiatric examination, requested by authority: Secondary | ICD-10-CM | POA: Diagnosis present

## 2018-10-07 DIAGNOSIS — R45850 Homicidal ideations: Secondary | ICD-10-CM | POA: Diagnosis not present

## 2018-10-07 NOTE — ED Triage Notes (Signed)
Pt arrives to ED via [facility] under IVC for a mental health evaluation. Pt denies SI or HI at this time. Pt denies any medical c/o's at this time. Pt is A&O, in NAD; cooperative. RR even, regular and unlabored.

## 2018-10-07 NOTE — ED Notes (Signed)
Pt belongings: 1 button-up blue/tan shirt, 1 pr "Fresh" sandals, 1 pr tan shorts, 1 pr boxer briefs. Pt also has a small Ziploc bag of belongings that includes keys, misc. change, and a cell phone. Belongings bagged and labeled per policy.

## 2018-10-08 ENCOUNTER — Emergency Department
Admission: EM | Admit: 2018-10-08 | Discharge: 2018-10-08 | Disposition: A | Discharge status: Home / Self Care | Payer: Medicare HMO | Attending: Emergency Medicine | Admitting: Emergency Medicine

## 2018-10-08 DIAGNOSIS — R45850 Homicidal ideations: Secondary | ICD-10-CM

## 2018-10-08 DIAGNOSIS — F10929 Alcohol use, unspecified with intoxication, unspecified: Secondary | ICD-10-CM

## 2018-10-08 DIAGNOSIS — F1014 Alcohol abuse with alcohol-induced mood disorder: Secondary | ICD-10-CM

## 2018-10-08 LAB — COMPREHENSIVE METABOLIC PANEL
ALT: 32 U/L (ref 0–44)
AST: 36 U/L (ref 15–41)
Albumin: 4.7 g/dL (ref 3.5–5.0)
Alkaline Phosphatase: 65 U/L (ref 38–126)
Anion gap: 9 (ref 5–15)
BUN: 12 mg/dL (ref 6–20)
CO2: 24 mmol/L (ref 22–32)
Calcium: 9.4 mg/dL (ref 8.9–10.3)
Chloride: 106 mmol/L (ref 98–111)
Creatinine, Ser: 1.06 mg/dL (ref 0.61–1.24)
GFR calc Af Amer: >60 mL/min (ref >60)
GFR calc non Af Amer: >60 mL/min (ref >60)
Glucose, Bld: 139 mg/dL — ABNORMAL HIGH (ref 70–99)
Potassium: 3.7 mmol/L (ref 3.5–5.1)
Sodium: 139 mmol/L (ref 135–145)
Total Bilirubin: 1.6 mg/dL — ABNORMAL HIGH (ref 0.3–1.2)
Total Protein: 8.4 g/dL — ABNORMAL HIGH (ref 6.5–8.1)

## 2018-10-08 LAB — CBC
HCT: 41.2 % (ref 39.0–52.0)
Hemoglobin: 14 g/dL (ref 13.0–17.0)
MCH: 33.4 pg (ref 26.0–34.0)
MCHC: 34 g/dL (ref 30.0–36.0)
MCV: 98.3 fL (ref 80.0–100.0)
Platelets: 203 10*3/uL (ref 150–400)
RBC: 4.19 MIL/uL — ABNORMAL LOW (ref 4.22–5.81)
RDW: 13.2 % (ref 11.5–15.5)
WBC: 4.9 10*3/uL (ref 4.0–10.5)
nRBC: 0 % (ref 0.0–0.2)

## 2018-10-08 LAB — URINE DRUG SCREEN, QUALITATIVE (ARMC ONLY)
Amphetamines, Ur Screen: NOT DETECTED
Barbiturates, Ur Screen: NOT DETECTED
Benzodiazepine, Ur Scrn: NOT DETECTED
Cannabinoid 50 Ng, Ur ~~LOC~~: NOT DETECTED
Cocaine Metabolite,Ur ~~LOC~~: NOT DETECTED
MDMA (Ecstasy)Ur Screen: NOT DETECTED
Methadone Scn, Ur: NOT DETECTED
Opiate, Ur Screen: NOT DETECTED
Phencyclidine (PCP) Ur S: NOT DETECTED
Tricyclic, Ur Screen: NOT DETECTED

## 2018-10-08 LAB — ETHANOL: Alcohol, Ethyl (B): 156 mg/dL — ABNORMAL HIGH (ref <10)

## 2018-10-08 LAB — ACETAMINOPHEN LEVEL: Acetaminophen (Tylenol), Serum: <10 ug/mL — ABNORMAL LOW (ref 10–30)

## 2018-10-08 LAB — SALICYLATE LEVEL: Salicylate Lvl: <7 mg/dL (ref 2.8–30.0)

## 2018-10-08 NOTE — ED Notes (Signed)
He has ambulated to and from the BR with a steady gait  

## 2018-10-08 NOTE — ED Provider Notes (Signed)
Involuntary commitment has been rescinded by psychiatry.  Patient is medically cleared for discharge at this time.  Is released into the custody of his family.   Earleen Newport, MD 10/08/18 1115
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
## 2018-10-08 NOTE — ED Notes (Signed)
Patient observed lying in bed with eyes closed  Even, unlabored respirations observed   NAD pt appears to be sleeping  I will continue to monitor along with every 15 minute visual observations and ongoing security monitoring    
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
## 2018-10-08 NOTE — ED Notes (Signed)
Patient given breakfast tray.

## 2018-10-08 NOTE — ED Notes (Signed)
ED BHU  Is the patient under IVC or is there intent for IVC: Yes.  To be rescinded by NP  Pt to discharge to home  Is the patient medically cleared: Yes.   Is there vacancy in the ED BHU: Yes.   Unit closed  Is the population mix appropriate for patient: Yes.   Is the patient awaiting placement in inpatient or outpatient setting:  No   Has the patient had a psychiatric consult: Yes.   Survey of unit performed for contraband, proper placement and condition of furniture, tampering with fixtures in bathroom, shower, and each patient room: Yes.  ; Findings:  APPEARANCE/BEHAVIOR Calm and cooperative NEURO ASSESSMENT Orientation: oriented x3  Denies pain Hallucinations: No.None noted (Hallucinations) denies  Speech: Normal Gait: normal RESPIRATORY ASSESSMENT Even  Unlabored respirations  CARDIOVASCULAR ASSESSMENT Pulses equal   regular rate  Skin warm and dry   GASTROINTESTINAL ASSESSMENT no GI complaint EXTREMITIES Full ROM  PLAN OF CARE Provide calm/safe environment. Vital signs assessed twice daily. ED BHU Assessment once each 12-hour shift. Collaborate with TTS daily or as condition indicates. Assure the ED provider has rounded once each shift. Provide and encourage hygiene. Provide redirection as needed. Assess for escalating behavior; address immediately and inform ED provider.  Assess family dynamic and appropriateness for visitation as needed: Yes.  ; If necessary, describe findings:  Educate the patient/family about BHU procedures/visitation: Yes.  ; If necessary, describe findings:
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
## 2018-10-08 NOTE — ED Notes (Signed)
Patient talking to psych NP Darnelle Maffucci and TTS Kerry Dory

## 2018-10-08 NOTE — ED Notes (Addendum)
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
## 2018-10-08 NOTE — ED Notes (Signed)
BEHAVIORAL HEALTH ROUNDING Patient sleeping: No. Patient alert and oriented: yes Behavior appropriate: Yes.  ; If no, describe:  Nutrition and fluids offered: yes Toileting and hygiene offered: Yes  Sitter present: q15 minute observations and security monitoring Law enforcement present: Yes    

## 2018-10-28 ENCOUNTER — Telehealth: Payer: Self-pay | Admitting: Internal Medicine

## 2018-10-28 ENCOUNTER — Ambulatory Visit (INDEPENDENT_AMBULATORY_CARE_PROVIDER_SITE_OTHER): Payer: Medicare HMO | Admitting: *Deleted

## 2018-10-28 DIAGNOSIS — I4729 Other ventricular tachycardia: Secondary | ICD-10-CM

## 2018-10-28 DIAGNOSIS — I422 Other hypertrophic cardiomyopathy: Secondary | ICD-10-CM

## 2018-10-28 DIAGNOSIS — I472 Ventricular tachycardia: Secondary | ICD-10-CM | POA: Diagnosis not present

## 2018-10-28 LAB — CUP PACEART REMOTE DEVICE CHECK
Battery Remaining Longevity: 70 mo
Battery Voltage: 2.99 V
Brady Statistic AP VP Percent: 0.06 %
Brady Statistic AP VS Percent: 20.23 %
Brady Statistic AS VP Percent: 0.05 %
Brady Statistic AS VS Percent: 79.66 %
Brady Statistic RA Percent Paced: 20.18 %
Brady Statistic RV Percent Paced: 0.13 %
Date Time Interrogation Session: 20200909073524
HighPow Impedance: 66 Ohm
Implantable Lead Implant Date: 20160217
Implantable Lead Implant Date: 20160217
Implantable Lead Location: 753859
Implantable Lead Location: 753860
Implantable Lead Model: 5076
Implantable Pulse Generator Implant Date: 20160217
Lead Channel Impedance Value: 342 Ohm
Lead Channel Impedance Value: 418 Ohm
Lead Channel Impedance Value: 475 Ohm
Lead Channel Pacing Threshold Amplitude: 0.625 V
Lead Channel Pacing Threshold Amplitude: 0.75 V
Lead Channel Pacing Threshold Pulse Width: 0.4 ms
Lead Channel Pacing Threshold Pulse Width: 0.4 ms
Lead Channel Sensing Intrinsic Amplitude: 2.25 mV
Lead Channel Sensing Intrinsic Amplitude: 2.25 mV
Lead Channel Sensing Intrinsic Amplitude: 31.625 mV
Lead Channel Sensing Intrinsic Amplitude: 31.625 mV
Lead Channel Setting Pacing Amplitude: 2 V
Lead Channel Setting Pacing Amplitude: 2.5 V
Lead Channel Setting Pacing Pulse Width: 0.4 ms
Lead Channel Setting Sensing Sensitivity: 0.3 mV

## 2018-10-28 NOTE — Telephone Encounter (Signed)
New Message   Patient is calling in for a letter to give to his electrical company that states "Patient uses a CPAP Machine that requires electricity. Note also needs to states that using the CPAP machine is detrimental to his health." Patient needs this note to be printed and mailed to him to avoid his electricity being cut off. Please give patient a call back to confirm.

## 2018-10-30 NOTE — Telephone Encounter (Signed)
The note needs to state that Patient needs to use CPAP nightly and if he cannot use it due to electricity problems it is detrimental to his health.

## 2018-10-30 NOTE — Telephone Encounter (Signed)
Pt aware letter is in the mail.

## 2018-11-08 ENCOUNTER — Other Ambulatory Visit: Payer: Self-pay

## 2018-11-08 ENCOUNTER — Emergency Department
Admission: EM | Admit: 2018-11-08 | Discharge: 2018-11-08 | Disposition: A | Discharge status: Home / Self Care | Payer: Medicare HMO | Attending: Emergency Medicine | Admitting: Emergency Medicine

## 2018-11-08 ENCOUNTER — Emergency Department: Payer: Medicare HMO

## 2018-11-08 DIAGNOSIS — Z79899 Other long term (current) drug therapy: Secondary | ICD-10-CM | POA: Insufficient documentation

## 2018-11-08 DIAGNOSIS — I10 Essential (primary) hypertension: Secondary | ICD-10-CM | POA: Insufficient documentation

## 2018-11-08 DIAGNOSIS — Z95810 Presence of automatic (implantable) cardiac defibrillator: Secondary | ICD-10-CM | POA: Insufficient documentation

## 2018-11-08 DIAGNOSIS — Z7982 Long term (current) use of aspirin: Secondary | ICD-10-CM | POA: Diagnosis not present

## 2018-11-08 DIAGNOSIS — F17228 Nicotine dependence, chewing tobacco, with other nicotine-induced disorders: Secondary | ICD-10-CM | POA: Insufficient documentation

## 2018-11-08 DIAGNOSIS — Z8673 Personal history of transient ischemic attack (TIA), and cerebral infarction without residual deficits: Secondary | ICD-10-CM | POA: Diagnosis not present

## 2018-11-08 LAB — CBC
HCT: 37.9 % — ABNORMAL LOW (ref 39.0–52.0)
Hemoglobin: 12.9 g/dL — ABNORMAL LOW (ref 13.0–17.0)
MCH: 33.3 pg (ref 26.0–34.0)
MCHC: 34 g/dL (ref 30.0–36.0)
MCV: 97.9 fL (ref 80.0–100.0)
Platelets: 173 10*3/uL (ref 150–400)
RBC: 3.87 MIL/uL — ABNORMAL LOW (ref 4.22–5.81)
RDW: 13.2 % (ref 11.5–15.5)
WBC: 5.2 10*3/uL (ref 4.0–10.5)
nRBC: 0 % (ref 0.0–0.2)

## 2018-11-08 LAB — BASIC METABOLIC PANEL
Anion gap: 9 (ref 5–15)
BUN: 18 mg/dL (ref 6–20)
CO2: 22 mmol/L (ref 22–32)
Calcium: 9.3 mg/dL (ref 8.9–10.3)
Chloride: 106 mmol/L (ref 98–111)
Creatinine, Ser: 0.9 mg/dL (ref 0.61–1.24)
GFR calc Af Amer: >60 mL/min (ref >60)
GFR calc non Af Amer: >60 mL/min (ref >60)
Glucose, Bld: 118 mg/dL — ABNORMAL HIGH (ref 70–99)
Potassium: 3.7 mmol/L (ref 3.5–5.1)
Sodium: 137 mmol/L (ref 135–145)

## 2018-11-08 LAB — TROPONIN I (HIGH SENSITIVITY): Troponin I (High Sensitivity): 24 ng/L — ABNORMAL HIGH (ref <18)

## 2018-11-08 NOTE — ED Notes (Signed)
Patient to xray via stretcher

## 2018-11-08 NOTE — ED Triage Notes (Addendum)
Patient reports hypertension X 1 week. Today at home his BP was 224 systolic. Patient reports bilateral arm/hand numbness.

## 2018-11-08 NOTE — ED Provider Notes (Signed)
[facility] Emergency Department Provider Note   ____________________________________________   First MD Initiated Contact with Patient 11/08/18 2110     (approximate)  I have reviewed the triage vital signs and the nursing notes.   HISTORY  Chief Complaint Hypertension    HPI Marc Bradford is a 58 y.o. male with past medical history of hypertension and hypertrophic cardiomyopathy status post AICD who presents to the ED complaining of elevated blood pressure.  Patient reports that he was checking his blood pressure today and he noted it to be as high as 230 systolic.  He states he has been feeling well and denies any chest pain, shortness of breath, headache, numbness, or weakness.  He spoke with the nurse on call at his primary care doctor's office, who recommended he come to the ED for evaluation.        Past Medical History:  Diagnosis Date  . Gout   . Hyperlipidemia   . Hypertension 09/30/2013  . 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 s/p MDT dual chamber ICD implant 03/2014 followed by Dr Graciela Husbands  . OSA (obstructive sleep apnea) 10/28/2014   Severe with AHI 78/hr now on CPAP  . Stroke (HCC) 09/22/2013   a. non-hemorrhagic b. residual left sided weakness    Patient Active Problem List   Diagnosis Date Noted  . NSVT (nonsustained ventricular tachycardia) (HCC) 03/17/2017  . Pre-operative clearance 03/17/2017  . OSA (obstructive sleep apnea) 10/28/2014  . Morbid obesity (HCC) 10/28/2014  . (HFpEF) heart failure with preserved ejection fraction (HCC) 04/06/2014  . 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 Essential hypertension 09/30/2013  . Hyperlipidemia 09/30/2013  . Left-sided weakness 09/28/2013    Past Surgical History:  Procedure Laterality Date  . IMPLANTABLE CARDIOVERTER DEFIBRILLATOR IMPLANT N/A 04/06/2014   MDT MRI compatible dual chamber ICD implanted by Dr Graciela Husbands    Prior to Admission medications    Medication Sig Start Date End Date Taking? Authorizing Provider  amLODipine (NORVASC) 5 MG tablet Take 5 mg by mouth daily.    [provider]  aspirin EC 81 MG tablet Take 81 mg by mouth daily.    [provider]  Cholecalciferol (VITAMIN D PO) Take 1 tablet by mouth daily.    [provider]  cyclobenzaprine (FLEXERIL) 5 MG tablet Take 1 tablet (5 mg total) by mouth 3 (three) times daily as needed for muscle spasms. 12/07/17   Willy Eddy, MD  furosemide (LASIX) 20 MG tablet Take 1 tablet (20 mg total) by mouth daily. 07/22/14   Duke Salvia, MD  propranolol ER (INDERAL LA) 80 MG 24 hr capsule Take 1 capsule (80 mg total) by mouth daily. 04/07/14   Azalee Course, PA  quinapril (ACCUPRIL) 40 MG tablet Take 40 mg by mouth daily.    [provider]  simvastatin (ZOCOR) 20 MG tablet Take 20 mg by mouth at bedtime.    [provider]  traMADol (ULTRAM) 50 MG tablet Take 1 tablet (50 mg total) by mouth every 6 (six) hours as needed. 12/07/17 12/07/18  Willy Eddy, MD    Allergies Patient has no known allergies.  Family History  Problem Relation Age of Onset  . Heart attack Mother   . Sudden death Mother   . Hypertension Mother   . Sudden death Father   . Diabetes Other        family history  . Drug abuse Brother   . Anemia Neg Hx   . Arrhythmia  Neg Hx   . Asthma Neg Hx   . Cancer Neg Hx   . Depression Neg Hx   . Heart failure Neg Hx   . Hyperlipidemia Neg Hx   . Kidney disease Neg Hx   . Thyroid disease Neg Hx   . Fainting Neg Hx   . Stroke Neg Hx     Social History Social History   Tobacco Use  . Smoking status: Never Smoker  . Smokeless tobacco: Current User    Types: Snuff  Substance Use Topics  . Alcohol use: Yes    Alcohol/week: 14.0 standard drinks    Types: 14 Cans of beer per week  . Drug use: No    Review of Systems  Constitutional: No fever/chills.  Positive for elevated blood pressure. Eyes: No visual  changes. ENT: No sore throat. Cardiovascular: Denies chest pain. Respiratory: Denies shortness of breath. Gastrointestinal: No abdominal pain.  No nausea, no vomiting.  No diarrhea.  No constipation. Genitourinary: Negative for dysuria. Musculoskeletal: Negative for back pain. Skin: Negative for rash. Neurological: Negative for headaches, focal weakness or numbness.  ____________________________________________   PHYSICAL EXAM:  VITAL SIGNS: ED Triage Vitals  Enc Vitals Group     BP 11/08/18 2100 (!) 168/92     Pulse Rate 11/08/18 2100 72     Resp 11/08/18 2100 18     Temp 11/08/18 2100 98.3 F (36.8 C)     Temp Source 11/08/18 2100 Oral     SpO2 11/08/18 2100 98 %     Weight 11/08/18 2101 215 lb (97.5 kg)     Height 11/08/18 2101 5\' 6"  (1.676 m)     Head Circumference --      Peak Flow --      Pain Score 11/08/18 2106 3     Pain Loc --      Pain Edu? --      Excl. in [place]? --     Constitutional: Alert and oriented. Eyes: Conjunctivae are normal. Head: Atraumatic. Nose: No congestion/rhinnorhea. Mouth/Throat: Mucous membranes are moist. Neck: Normal ROM Cardiovascular: Normal rate, regular rhythm. Grossly normal heart sounds. Respiratory: Normal respiratory effort.  No retractions. Lungs CTAB. Gastrointestinal: Soft and nontender. No distention. Genitourinary: deferred Musculoskeletal: No lower extremity tenderness nor edema. Neurologic:  Normal speech and language. No gross focal neurologic deficits are appreciated. Skin:  Skin is warm, dry and intact. No rash noted. Psychiatric: Mood and affect are normal. Speech and behavior are normal.  ____________________________________________   LABS (all labs ordered are listed, but only abnormal results are displayed)  Labs Reviewed  BASIC METABOLIC PANEL - Abnormal; Notable for the following components:      Result Value   Glucose, Bld 118 (*)    All other components within normal limits  CBC - Abnormal; Notable  for the following components:   RBC 3.87 (*)    Hemoglobin 12.9 (*)    HCT 37.9 (*)    All other components within normal limits  TROPONIN I (HIGH SENSITIVITY) - Abnormal; Notable for the following components:   Troponin I (High Sensitivity) 24 (*)    All other components within normal limits  TROPONIN I (HIGH SENSITIVITY)   ____________________________________________  EKG  ED ECG REPORT I, Blake Divine, the attending physician, personally viewed and interpreted this ECG.   Date: 11/09/2018  EKG Time: 21:23  Rate: 60  Rhythm: normal sinus rhythm  Axis: LAD  Intervals:none  ST&T Change: Diffuse T wave inversion, similar to prior  PROCEDURES  Procedure(s) performed (including Critical Care):  Procedures   ____________________________________________   INITIAL IMPRESSION / ASSESSMENT AND PLAN / ED COURSE       58 year old male presents to the ED with elevated blood pressure at home, states he is otherwise asymptomatic.  He had initially stated to nursing that his arms felt numb, but he now denies this.  He has a benign and nonfocal neurologic exam.  EKG shows diffuse T wave inversions which is unchanged from prior.  Troponin is very mildly elevated, but given lack of chest pain or any other possible symptom representing anginal equivalent, doubt ACS.  Chest x-ray negative for acute process and remainder of labs unremarkable.  Counseled patient to follow-up with his PCP regarding blood pressure management as it is much improved here in the ED without intervention.  Patient agrees with plan.      ____________________________________________   FINAL CLINICAL IMPRESSION(S) / ED DIAGNOSES  Final diagnoses:  Essential hypertension     ED Discharge Orders    None       Note:  This document was prepared using Dragon voice recognition software and may include unintentional dictation errors.   Chesley NoonJessup, [place] Pitta, MD 11/09/18 [phone]

## 2018-11-08 NOTE — ED Notes (Signed)
Returned to room.

## 2018-11-08 NOTE — ED Notes (Signed)
Patient reports he had noticed his blood pressure had been up for the past week.  Reports noticed when his pressure was up both his arms felt numb.  Patient denies chest pain or shortness of breath.

## 2018-11-11 ENCOUNTER — Telehealth: Payer: Self-pay

## 2018-11-11 NOTE — Telephone Encounter (Signed)
The pt called with concerns with his monitor. The monitor showed signs of updating. I told the pt that his monitor is updating. I told him to unplug the monitor for 20 seconds and plug it back up. The monitor will reboot and it should return to normal. I told him if he has any other problem with the monitor to give me a call back.

## 2018-11-11 NOTE — Progress Notes (Signed)
Remote ICD transmission.   

## 2019-01-27 ENCOUNTER — Ambulatory Visit (INDEPENDENT_AMBULATORY_CARE_PROVIDER_SITE_OTHER): Payer: Medicare HMO | Admitting: *Deleted

## 2019-01-27 DIAGNOSIS — I422 Other hypertrophic cardiomyopathy: Secondary | ICD-10-CM

## 2019-01-27 LAB — CUP PACEART REMOTE DEVICE CHECK
Battery Remaining Longevity: 67 mo
Battery Voltage: 2.98 V
Brady Statistic AP VP Percent: 0.06 %
Brady Statistic AP VS Percent: 21.24 %
Brady Statistic AS VP Percent: 0.05 %
Brady Statistic AS VS Percent: 78.66 %
Brady Statistic RA Percent Paced: 21.21 %
Brady Statistic RV Percent Paced: 0.11 %
Date Time Interrogation Session: 20201209001704
HighPow Impedance: 63 Ohm
Implantable Lead Implant Date: 20160217
Implantable Lead Implant Date: 20160217
Implantable Lead Location: 753859
Implantable Lead Location: 753860
Implantable Lead Model: 5076
Implantable Pulse Generator Implant Date: 20160217
Lead Channel Impedance Value: 361 Ohm
Lead Channel Impedance Value: 418 Ohm
Lead Channel Impedance Value: 456 Ohm
Lead Channel Pacing Threshold Amplitude: 0.625 V
Lead Channel Pacing Threshold Amplitude: 0.625 V
Lead Channel Pacing Threshold Pulse Width: 0.4 ms
Lead Channel Pacing Threshold Pulse Width: 0.4 ms
Lead Channel Sensing Intrinsic Amplitude: 2.625 mV
Lead Channel Sensing Intrinsic Amplitude: 2.625 mV
Lead Channel Sensing Intrinsic Amplitude: 31.625 mV
Lead Channel Sensing Intrinsic Amplitude: 31.625 mV
Lead Channel Setting Pacing Amplitude: 2 V
Lead Channel Setting Pacing Amplitude: 2.5 V
Lead Channel Setting Pacing Pulse Width: 0.4 ms
Lead Channel Setting Sensing Sensitivity: 0.3 mV
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
## 2019-03-06 NOTE — Progress Notes (Signed)
ICD remote 

## 2019-03-23 DIAGNOSIS — Z95810 Presence of automatic (implantable) cardiac defibrillator: Secondary | ICD-10-CM | POA: Insufficient documentation

## 2019-03-24 ENCOUNTER — Encounter: Payer: Self-pay | Admitting: Internal Medicine

## 2019-03-24 ENCOUNTER — Other Ambulatory Visit: Payer: Self-pay

## 2019-03-24 ENCOUNTER — Ambulatory Visit: Payer: Medicare HMO | Admitting: Internal Medicine

## 2019-03-24 VITALS — BP 134/78 | HR 63 | Ht 66.0 in | Wt 239.2 lb

## 2019-03-24 DIAGNOSIS — I4729 Other ventricular tachycardia: Secondary | ICD-10-CM

## 2019-03-24 DIAGNOSIS — I472 Ventricular tachycardia: Secondary | ICD-10-CM

## 2019-03-24 DIAGNOSIS — I422 Other hypertrophic cardiomyopathy: Secondary | ICD-10-CM

## 2019-03-24 DIAGNOSIS — I5030 Unspecified diastolic (congestive) heart failure: Secondary | ICD-10-CM | POA: Diagnosis not present

## 2019-03-24 DIAGNOSIS — Z95810 Presence of automatic (implantable) cardiac defibrillator: Secondary | ICD-10-CM | POA: Diagnosis not present

## 2019-03-24 NOTE — Patient Instructions (Signed)

## 2019-03-24 NOTE — Progress Notes (Signed)
      Patient Care Team: Oswaldo Conroy, MD as PCP - General (Family Medicine) Duke Salvia, MD as PCP - Cardiology (Cardiology)   HPI  Marc Bradford is a 59 y.o. male Seen in follow-up for apical hypertrophic cardiomyopathy. MRI demonstrates significant gadolinium enhancement. He had nonsustained ventricular tachycardia and underwent primary prevention ICD Medtronic     He has a history of a prior stroke with residual weakness as well as hypertension    The patient denies chest pain, shortness of breath, nocturnal dyspnea, orthopnea or peripheral edema.  There have been no palpitations, lightheadedness or syncope.    Date Cr K  10/17 0.8 3.4  7/19  0.84 4.2   Obesity and treated sleep apnea  Past Medical History:  Diagnosis Date  . Gout   . Hyperlipidemia   . Hypertension 09/30/2013  . 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 s/p MDT dual chamber ICD implant 03/2014 followed by Dr Graciela Husbands  . OSA (obstructive sleep apnea) 10/28/2014   Severe with AHI 78/hr now on CPAP  . Stroke (HCC) 09/22/2013   a. non-hemorrhagic b. residual left sided weakness    Past Surgical History:  Procedure Laterality Date  . IMPLANTABLE CARDIOVERTER DEFIBRILLATOR IMPLANT N/A 04/06/2014   MDT MRI compatible dual chamber ICD implanted by Dr Graciela Husbands    Current Outpatient Medications  Medication Sig Dispense Refill  . allopurinol (ZYLOPRIM) 300 MG tablet Take 300 mg by mouth daily.    Marland Kitchen amLODipine (NORVASC) 2.5 MG tablet Take 2.5 mg by mouth daily.    Marland Kitchen aspirin EC 81 MG tablet Take 81 mg by mouth daily.    Marland Kitchen atorvastatin (LIPITOR) 80 MG tablet Take 80 mg by mouth at bedtime.    . Cholecalciferol (VITAMIN D PO) Take 1 tablet by mouth daily.    . furosemide (LASIX) 20 MG tablet Take 1 tablet (20 mg total) by mouth daily. 30 tablet 3  . lisinopril (ZESTRIL) 40 MG tablet Take 40 mg by mouth daily.    . propranolol ER (INDERAL LA) 80 MG 24 hr capsule Take 1 capsule (80 mg total) by mouth  daily. 30 capsule 5  . quinapril (ACCUPRIL) 40 MG tablet Take 40 mg by mouth daily.    . simvastatin (ZOCOR) 20 MG tablet Take 20 mg by mouth at bedtime.     No current facility-administered medications for this visit.    No Known Allergies    Review of Systems negative except from HPI and PMH  Physical Exam BP 134/78   Pulse 63   Ht 5\' 6"  (1.676 m)   Wt 239 lb 3.2 oz (108.5 kg)   SpO2 96%   BMI 38.61 kg/m  Well developed and well nourished in no acute distress HENT normal Neck supple with JVP-flat Clear Device pocket well healed; without hematoma or erythema.  There is no tethering  Regular rate and rhythm, no  * murmur Abd-soft with active BS No Clubbing cyanosis   edema Skin-warm and dry A & Oriented  Grossly normal sensory and motor function  ECG  Sinus @ 63 29/10/45 TWI V3-V6 2,3,F  Assessment and  Plan  HCM-atypical variant  VT nonsustained  ICD Medtronic    HFpEF  Morbidly obese  AFib  Hypertension   Optivol elevated-- but no symptoms BP elevated but not too bad  Holding up pretty well since his wife died  AFib events All < 1 min   Medicines reviewed

## 2019-04-19 LAB — CUP PACEART INCLINIC DEVICE CHECK
Brady Statistic AP VP Percent: 0.1 % — CL
Brady Statistic AP VS Percent: 22.9 %
Brady Statistic AS VP Percent: 0.1 % — CL
Brady Statistic AS VS Percent: 77 %
Date Time Interrogation Session: 20210203081129
Implantable Lead Implant Date: 20160217
Implantable Lead Implant Date: 20160217
Implantable Lead Location: 753859
Implantable Lead Location: 753860
Implantable Lead Model: 5076
Implantable Pulse Generator Implant Date: 20160217
Lead Channel Pacing Threshold Amplitude: 0.75 V
Lead Channel Pacing Threshold Amplitude: 0.75 V
Lead Channel Pacing Threshold Pulse Width: 0.4 ms
Lead Channel Pacing Threshold Pulse Width: 0.4 ms
Lead Channel Sensing Intrinsic Amplitude: 2.9 mV
Lead Channel Sensing Intrinsic Amplitude: 20 mV
Lead Channel Setting Pacing Amplitude: 2 V
Lead Channel Setting Pacing Amplitude: 2.5 V
Lead Channel Setting Pacing Pulse Width: 0.4 ms
Lead Channel Setting Sensing Sensitivity: 0.3 mV

## 2019-04-28 ENCOUNTER — Ambulatory Visit (INDEPENDENT_AMBULATORY_CARE_PROVIDER_SITE_OTHER): Payer: Medicare HMO | Admitting: *Deleted

## 2019-04-28 DIAGNOSIS — I422 Other hypertrophic cardiomyopathy: Secondary | ICD-10-CM

## 2019-04-28 LAB — CUP PACEART REMOTE DEVICE CHECK
Battery Remaining Longevity: 59 mo
Battery Voltage: 2.98 V
Brady Statistic AP VP Percent: 0.04 %
Brady Statistic AP VS Percent: 24.6 %
Brady Statistic AS VP Percent: 0.04 %
Brady Statistic AS VS Percent: 75.32 %
Brady Statistic RA Percent Paced: 24.54 %
Brady Statistic RV Percent Paced: 0.09 %
Date Time Interrogation Session: 20210310002204
HighPow Impedance: 66 Ohm
Implantable Lead Implant Date: 20160217
Implantable Lead Implant Date: 20160217
Implantable Lead Location: 753859
Implantable Lead Location: 753860
Implantable Lead Model: 5076
Implantable Pulse Generator Implant Date: 20160217
Lead Channel Impedance Value: 342 Ohm
Lead Channel Impedance Value: 456 Ohm
Lead Channel Impedance Value: 475 Ohm
Lead Channel Pacing Threshold Amplitude: 0.625 V
Lead Channel Pacing Threshold Amplitude: 0.625 V
Lead Channel Pacing Threshold Pulse Width: 0.4 ms
Lead Channel Pacing Threshold Pulse Width: 0.4 ms
Lead Channel Sensing Intrinsic Amplitude: 2.125 mV
Lead Channel Sensing Intrinsic Amplitude: 2.125 mV
Lead Channel Sensing Intrinsic Amplitude: 31.625 mV
Lead Channel Sensing Intrinsic Amplitude: 31.625 mV
Lead Channel Setting Pacing Amplitude: 2 V
Lead Channel Setting Pacing Amplitude: 2.5 V
Lead Channel Setting Pacing Pulse Width: 0.4 ms
Lead Channel Setting Sensing Sensitivity: 0.3 mV

## 2019-04-28 NOTE — Progress Notes (Signed)
ICD Remote  

## 2019-06-11 IMAGING — US US EXTREM LOW VENOUS*L*
1 series · 13 of 24 positions shown · non-contrast
Comparison: None.

CLINICAL DATA: Left calf pain for 6 days.



[Series 1: us extrem low venous*left* · 13 of 47 slices shown]
[im 1/47]
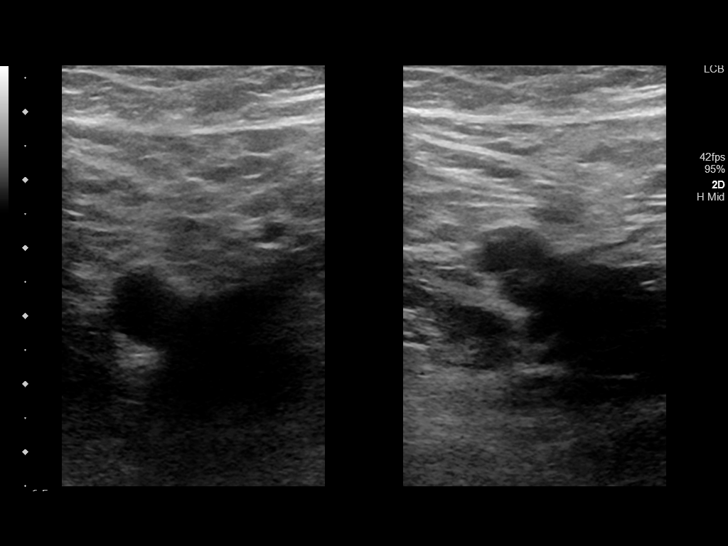
[im 5/47]
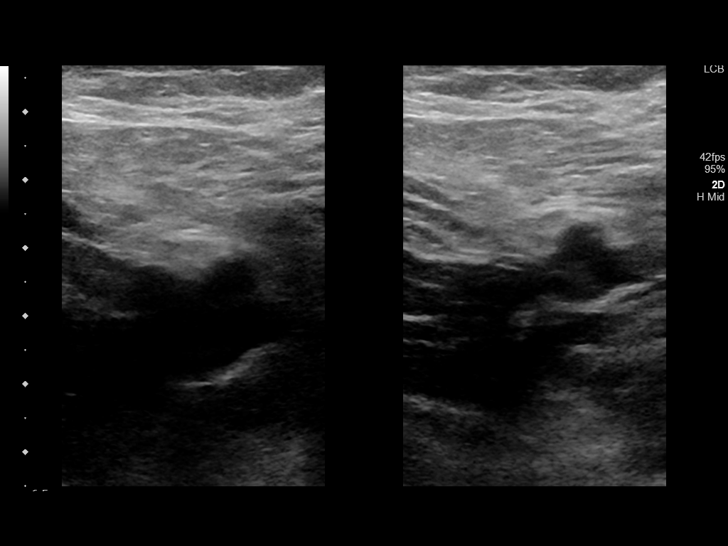
[im 9/47]
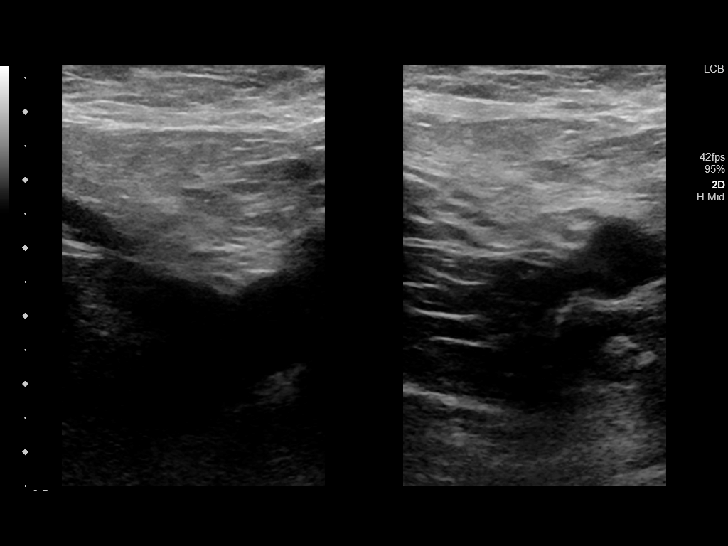
[im 13/47]
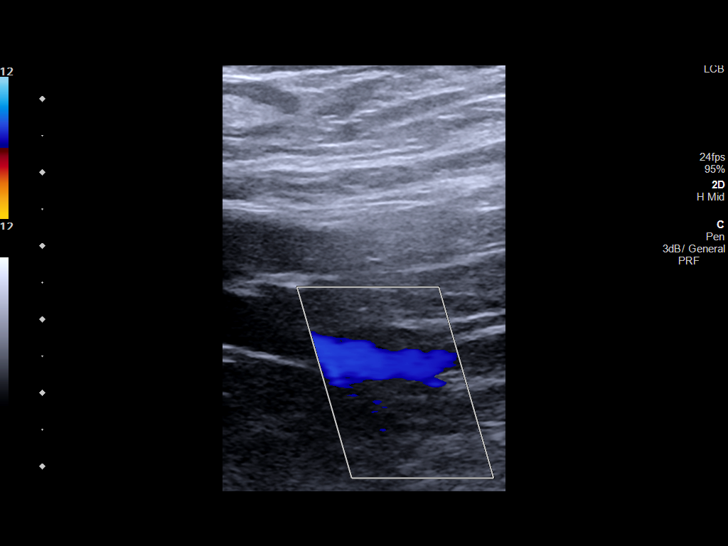
[im 17/47]
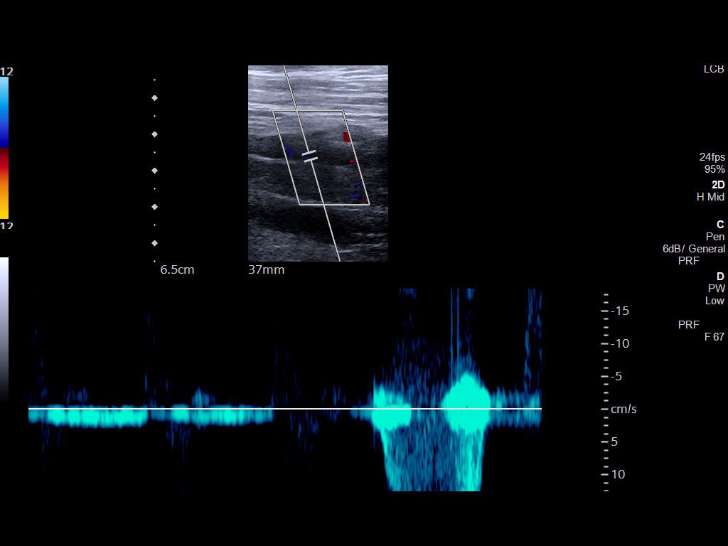
[im 21/47]
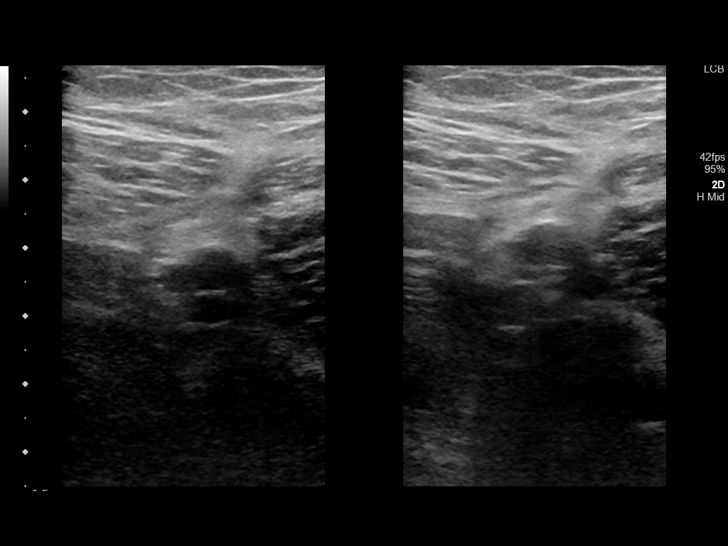
[im 25/47]
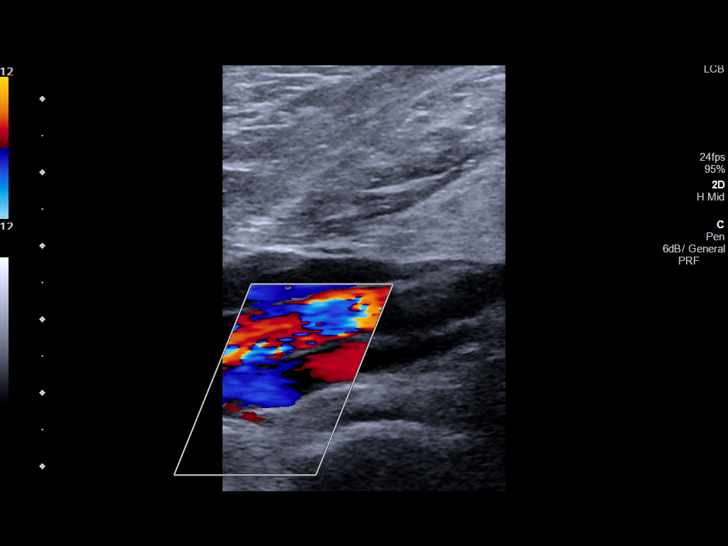
[im 27/47]
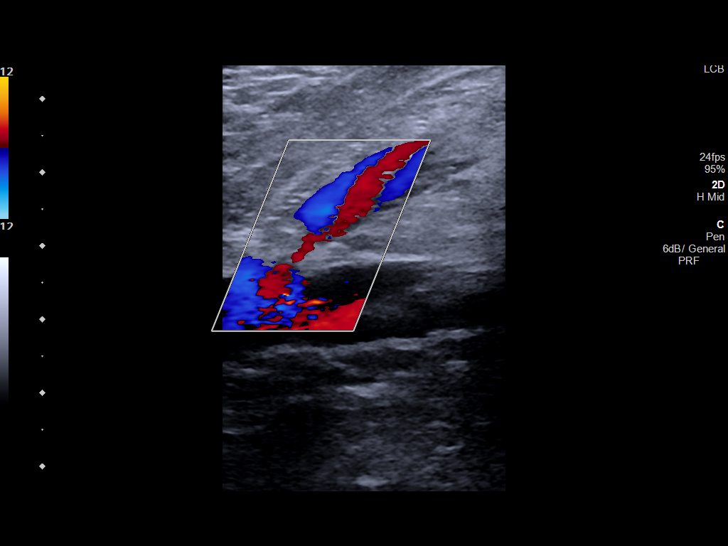
[im 31/47]
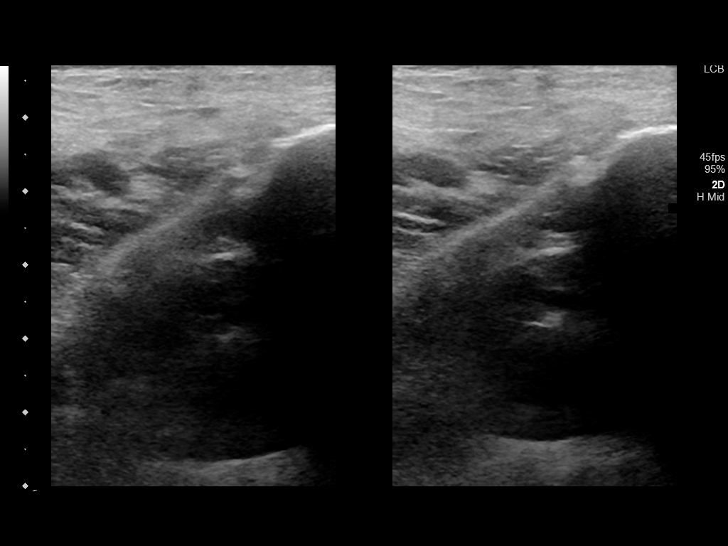
[im 35/47]
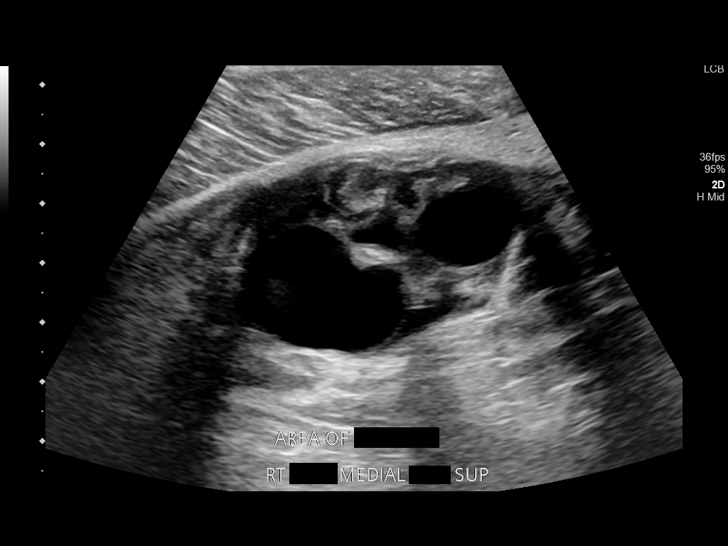
[im 39/47]
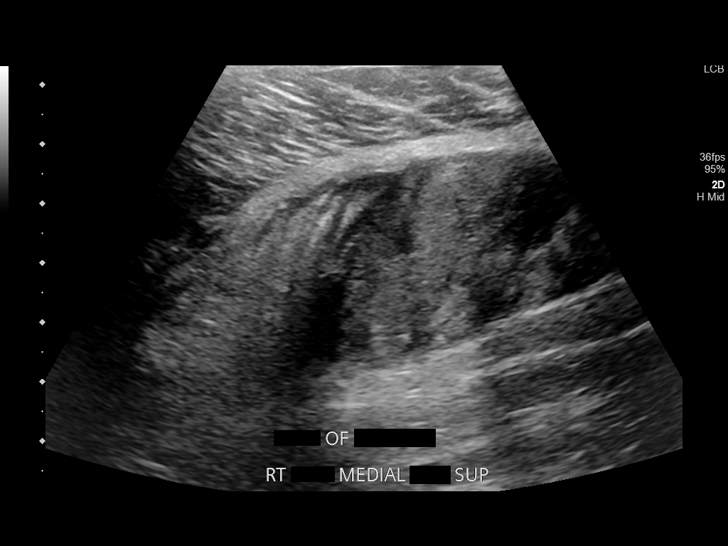
[im 43/47]
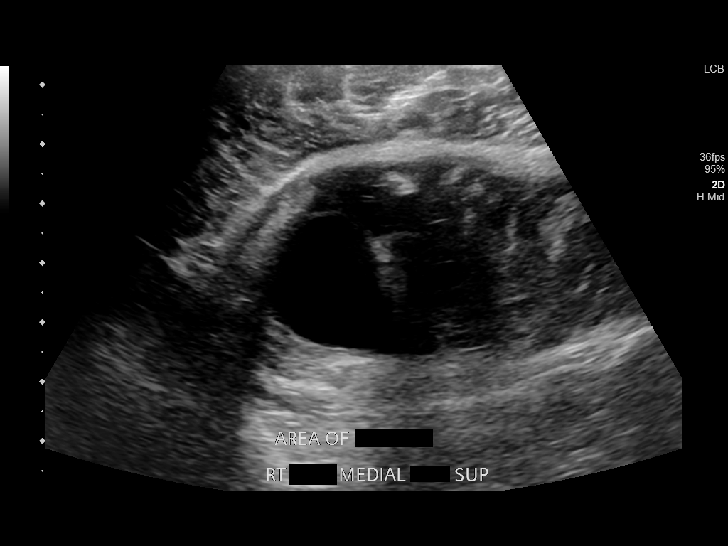
[im 47/47]
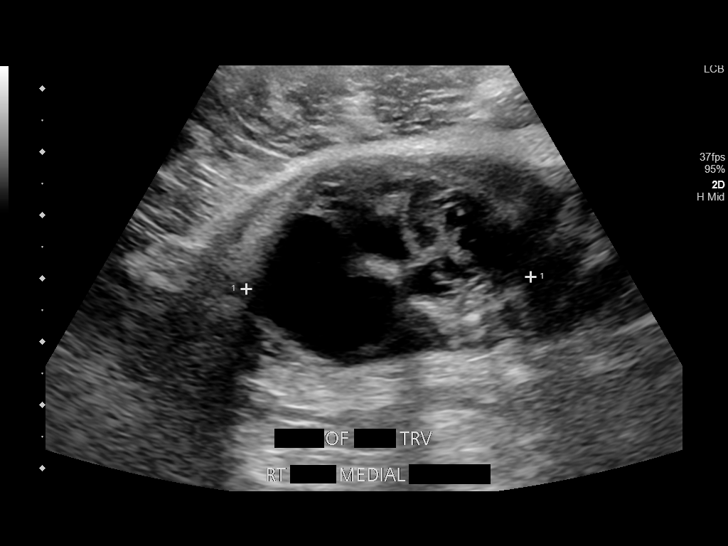

[13 of 24 positions shown; findings below may reference images not displayed]

FINDINGS: Contralateral Common Femoral Vein: Respiratory phasicity is normal
and symmetric with the symptomatic side. No evidence of thrombus.
Normal compressibility.

Common Femoral Vein: No evidence of thrombus. Normal
compressibility, respiratory phasicity and response to augmentation.

Saphenofemoral Junction: No evidence of thrombus. Normal
compressibility and flow on color Doppler imaging.

Profunda Femoral Vein: No evidence of thrombus. Normal
compressibility and flow on color Doppler imaging.

Femoral Vein: No evidence of thrombus. Normal compressibility,
respiratory phasicity and response to augmentation.

Popliteal Vein: No evidence of thrombus. Normal compressibility,
respiratory phasicity and response to augmentation.

Calf Veins: No evidence of thrombus. Normal compressibility and flow
on color Doppler imaging.

Superficial Great Saphenous Vein: No evidence of thrombus. Normal
compressibility.

Venous Reflux:  None.

Other Findings: There is a circumscribed complex mixed echogenicity,
cystic spaces containing mass in the left medial calf, which
corresponds to the area of pain described by the patient. It
measures 5.1 by 3.3 by 4.5 cm. Scant internal blood flow is noted.
IMPRESSION: No evidence of deep venous thrombosis.

The area of pain in the superior medial left calf corresponds to a
circumscribed complex mass measuring 5.1 cm in greatest dimension.
Differential diagnosis includes intramuscular hematoma,
intramuscular rupture with disorganized contracted muscle fibers, or
true cystic intramuscular mass. Please correlate clinically. If
further imaging evaluation is desired, MRI with contrast of the
affected area may be considered.

## 2019-06-17 ENCOUNTER — Telehealth: Payer: Self-pay | Admitting: *Deleted

## 2019-06-17 NOTE — Telephone Encounter (Signed)
  Patient Consent for Virtual Visit         Marc Bradford has provided verbal consent on 06/17/2019 for a virtual visit (video or telephone).   CONSENT FOR VIRTUAL VISIT FOR:  Marc Bradford  By participating in this virtual visit I agree to the following:  I hereby voluntarily request, consent and authorize [facility] and its employed or contracted physicians, physician assistants, nurse practitioners or other licensed health care professionals (the Practitioner), to provide me with telemedicine health care services (the "Services") as deemed necessary by the treating Practitioner. I acknowledge and consent to receive the Services by the Practitioner via telemedicine. I understand that the telemedicine visit will involve communicating with the Practitioner through live audiovisual communication technology and the disclosure of certain medical information by electronic transmission. I acknowledge that I have been given the opportunity to request an in-person assessment or other available alternative prior to the telemedicine visit and am voluntarily participating in the telemedicine visit.  I understand that I have the right to withhold or withdraw my consent to the use of telemedicine in the course of my care at any time, without affecting my right to future care or treatment, and that the Practitioner or I may terminate the telemedicine visit at any time. I understand that I have the right to inspect all information obtained and/or recorded in the course of the telemedicine visit and may receive copies of available information for a reasonable fee.  I understand that some of the potential risks of receiving the Services via telemedicine include:  Marland Kitchen Delay or interruption in medical evaluation due to technological equipment failure or disruption; . Information transmitted may not be sufficient (e.g. poor resolution of images) to allow for appropriate medical decision making by the Practitioner; and/or   . In rare instances, security protocols could fail, causing a breach of personal health information.  Furthermore, I acknowledge that it is my responsibility to provide information about my medical history, conditions and care that is complete and accurate to the best of my ability. I acknowledge that Practitioner's advice, recommendations, and/or decision may be based on factors not within their control, such as incomplete or inaccurate data provided by me or distortions of diagnostic images or specimens that may result from electronic transmissions. I understand that the practice of medicine is not an exact science and that Practitioner makes no warranties or guarantees regarding treatment outcomes. I acknowledge that a copy of this consent can be made available to me via my patient portal [facility] MyChart), or I can request a printed copy by calling the office of [facility].    I understand that my insurance will be billed for this visit.   I have read or had this consent read to me. . I understand the contents of this consent, which adequately explains the benefits and risks of the Services being provided via telemedicine.  . I have been provided ample opportunity to ask questions regarding this consent and the Services and have had my questions answered to my satisfaction. . I give my informed consent for the services to be provided through the use of telemedicine in my medical care

## 2019-06-18 ENCOUNTER — Telehealth (INDEPENDENT_AMBULATORY_CARE_PROVIDER_SITE_OTHER): Payer: Medicare HMO | Admitting: Cardiology

## 2019-06-18 ENCOUNTER — Other Ambulatory Visit: Payer: Self-pay

## 2019-06-18 ENCOUNTER — Encounter: Payer: Self-pay | Admitting: Cardiology

## 2019-06-18 VITALS — Ht 66.0 in | Wt 235.0 lb

## 2019-06-18 DIAGNOSIS — G4733 Obstructive sleep apnea (adult) (pediatric): Secondary | ICD-10-CM

## 2019-06-18 DIAGNOSIS — I10 Essential (primary) hypertension: Secondary | ICD-10-CM | POA: Diagnosis not present

## 2019-06-18 NOTE — Patient Instructions (Signed)
Medication Instructions:  Your physician recommends that you continue on your current medications as directed. Please refer to the Current Medication list given to you today.  *If you need a refill on your cardiac medications before your next appointment, please call your pharmacy*   Lab Work: none If you have labs (blood work) drawn today and your tests are completely normal, you will receive your results only by: . MyChart Message (if you have MyChart) OR . A paper copy in the mail If you have any lab test that is abnormal or we need to change your treatment, we will call you to review the results.   Testing/Procedures: none   Follow-Up: At [facility], you and your health needs are our priority.  As part of our continuing mission to provide you with exceptional heart care, we have created designated Provider Care Teams.  These Care Teams include your primary Cardiologist (physician) and Advanced Practice Providers (APPs -  Physician Assistants and Nurse Practitioners) who all work together to provide you with the care you need, when you need it.  We recommend signing up for the patient portal called "MyChart".  Sign up information is provided on this After Visit Summary.  MyChart is used to connect with patients for Virtual Visits (Telemedicine).  Patients are able to view lab/test results, encounter notes, upcoming appointments, etc.  Non-urgent messages can be sent to your provider as well.   To learn more about what you can do with MyChart, go to [email or web address].    Your next appointment:   1 year(s)  The format for your next appointment:   Virtual Visit   Provider:   Traci Turner, MD   Other Instructions   

## 2019-06-18 NOTE — Progress Notes (Signed)
Virtual Visit via Telephone Note   This visit type was conducted due to national recommendations for restrictions regarding the COVID-19 Pandemic (e.g. social distancing) in an effort to limit this patient's exposure and mitigate transmission in our community.  Due to his co-morbid illnesses, this patient is at least at moderate risk for complications without adequate follow up.  This format is felt to be most appropriate for this patient at this time.  The patient did not have access to video technology/had technical difficulties with video requiring transitioning to audio format only (telephone).  All issues noted in this document were discussed and addressed.  No physical exam could be performed with this format.  Please refer to the patient's chart for his  consent to telehealth for [facility].   Evaluation Performed:  Follow-up visit  This visit type was conducted due to national recommendations for restrictions regarding the COVID-19 Pandemic (e.g. social distancing).  This format is felt to be most appropriate for this patient at this time.  All issues noted in this document were discussed and addressed.  No physical exam was performed (except for noted visual exam findings with Video Visits).  Please refer to the patient's chart (MyChart message for video visits and phone note for telephone visits) for the patient's consent to telehealth for [facility].  Date:  06/18/2019   ID:  Marc Bradford, DOB 07-28-1960, MRN [id number]  Patient Location:  Home  Provider location:   [place]  PCP:  Oswaldo Conroy, MD  Cardiologist:  Sherryl Manges, MD  Sleep Medicine:  Armanda Magic, MD Electrophysiologist:  None   Chief Complaint:  OSA  History of Present Illness:    Marc Bradford is a 59 y.o. male who presents via audio/video conferencing for a telehealth visit today.    Marc Bradford is a 59 y.o. male with a history of severe OSA with an AHI of 77.8/hr and is onCPAP at11cm H2O.   He is doing well with his CPAP device and thinks that he has gotten used to it.  He tolerates the mask and feels the pressure is adequate.  Since going on CPAP he feels rested in the am and has no significant daytime sleepiness.  He denies any significant mouth or nasal dryness or nasal congestion.  He does not think that he snores.    The patient does not have symptoms concerning for COVID-19 infection (fever, chills, cough, or new shortness of breath).   Prior CV studies:   The following studies were reviewed today:  PAP compliance download  Past Medical History:  Diagnosis Date  . Gout   . Hyperlipidemia   . Hypertension 09/30/2013  . 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 s/p MDT dual chamber ICD implant 03/2014 followed by Dr Graciela Husbands  . OSA (obstructive sleep apnea) 10/28/2014   Severe with AHI 78/hr now on CPAP  . Stroke (HCC) 09/22/2013   a. non-hemorrhagic b. residual left sided weakness   Past Surgical History:  Procedure Laterality Date  . IMPLANTABLE CARDIOVERTER DEFIBRILLATOR IMPLANT N/A 04/06/2014   MDT MRI compatible dual chamber ICD implanted by Dr Graciela Husbands     Current Meds  Medication Sig  . allopurinol (ZYLOPRIM) 300 MG tablet Take 300 mg by mouth daily.  Marland Kitchen amLODipine (NORVASC) 2.5 MG tablet Take 2.5 mg by mouth daily.  Marland Kitchen aspirin EC 81 MG tablet Take 81 mg by mouth daily.  Marland Kitchen atorvastatin (LIPITOR) 80 MG tablet Take 80 mg by mouth at bedtime.  . Cholecalciferol (  VITAMIN D PO) Take 1 tablet by mouth daily.  . furosemide (LASIX) 20 MG tablet Take 1 tablet (20 mg total) by mouth daily.  Marland Kitchen lisinopril (ZESTRIL) 40 MG tablet Take 40 mg by mouth daily.  . propranolol ER (INDERAL LA) 80 MG 24 hr capsule Take 1 capsule (80 mg total) by mouth daily.  . quinapril (ACCUPRIL) 40 MG tablet Take 40 mg by mouth daily.     Allergies:   Patient has no known allergies.   Social History   Tobacco Use  . Smoking status: Never Smoker  . Smokeless tobacco: Current User    Types:  Snuff  Substance Use Topics  . Alcohol use: Yes    Alcohol/week: 14.0 standard drinks    Types: 14 Cans of beer per week  . Drug use: No     Family Hx: The patient's family history includes Diabetes in an other family member; Drug abuse in his brother; Heart attack in his mother; Hypertension in his mother; Sudden death in his father and mother. There is no history of Anemia, Arrhythmia, Asthma, Cancer, Depression, Heart failure, Hyperlipidemia, Kidney disease, Thyroid disease, Fainting, or Stroke.  ROS:   Please see the history of present illness.     All other systems reviewed and are negative.   Labs/Other Tests and Data Reviewed:    Recent Labs: 10/07/2018: ALT 32 11/08/2018: BUN 18; Creatinine, Ser 0.90; Hemoglobin 12.9; Platelets 173; Potassium 3.7; Sodium 137   Recent Lipid Panel Lab Results  Component Value Date/Time   CHOL 180 09/29/2013 02:38 AM   CHOL 120 05/24/2011 04:44 AM   TRIG 382 (H) 09/29/2013 02:38 AM   TRIG 288 (H) 05/24/2011 04:44 AM   HDL 35 (L) 09/29/2013 02:38 AM   HDL 28 (L) 05/24/2011 04:44 AM   CHOLHDL 5.1 09/29/2013 02:38 AM   LDLCALC 69 09/29/2013 02:38 AM   LDLCALC 34 05/24/2011 04:44 AM    Wt Readings from Last 3 Encounters:  06/18/19 235 lb (106.6 kg)  03/24/19 239 lb 3.2 oz (108.5 kg)  11/08/18 233 lb (105.7 kg)     Objective:    Vital Signs:  Ht 5\' 6"  (1.676 m)   Wt 235 lb (106.6 kg)   BMI 37.93 kg/m     ASSESSMENT & PLAN:    1.  OSA - The patient is tolerating PAP therapy well without any problems. The PAP download was reviewed today and showed an AHI of 1.3/hr on 11 cm H2O with 93% compliance in using more than 4 hours nightly.  The patient has been using and benefiting from PAP use and will continue to benefit from therapy.   2.  HTN -continue on amlodipine 2.5mg  daily, Lisinopril 40mg  daily and Inderal 80mg  daily.   3.  Morbid Obesity -I have encouraged him to get into a routine exercise program and cut back on carbs and  portions.   COVID-19 Education: The signs and symptoms of COVID-19 were discussed with the patient and how to seek care for testing (follow up with PCP or arrange E-visit).  The importance of social distancing was discussed today.  Patient Risk:   After full review of this patient's clinical status, I feel that they are at least moderate risk at this time.  Time:   Today, I have spent 20 minutes on telemedicine discussing medical problems including OSA, HTN, Obesity and reviewing patient's chart including PAP compliance download.  Medication Adjustments/Labs and Tests Ordered: Current medicines are reviewed at length with the patient today.  Concerns regarding medicines are outlined above.  Tests Ordered: No orders of the defined types were placed in this encounter.  Medication Changes: No orders of the defined types were placed in this encounter.   Disposition:  Follow up in 1 year(s)  Signed, Armanda Magic, MD  06/18/2019 8:35 AM    [facility]

## 2019-07-28 ENCOUNTER — Ambulatory Visit (INDEPENDENT_AMBULATORY_CARE_PROVIDER_SITE_OTHER): Payer: Medicare HMO | Admitting: *Deleted

## 2019-07-28 DIAGNOSIS — I5030 Unspecified diastolic (congestive) heart failure: Secondary | ICD-10-CM

## 2019-07-28 LAB — CUP PACEART REMOTE DEVICE CHECK
Battery Remaining Longevity: 56 mo
Battery Voltage: 2.98 V
Brady Statistic AP VP Percent: 0.06 %
Brady Statistic AP VS Percent: 23.04 %
Brady Statistic AS VP Percent: 0.04 %
Brady Statistic AS VS Percent: 76.87 %
Brady Statistic RA Percent Paced: 22.97 %
Brady Statistic RV Percent Paced: 0.1 %
Date Time Interrogation Session: 20210609043823
HighPow Impedance: 65 Ohm
Implantable Lead Implant Date: 20160217
Implantable Lead Implant Date: 20160217
Implantable Lead Location: 753859
Implantable Lead Location: 753860
Implantable Lead Model: 5076
Implantable Pulse Generator Implant Date: 20160217
Lead Channel Impedance Value: 342 Ohm
Lead Channel Impedance Value: 418 Ohm
Lead Channel Impedance Value: 456 Ohm
Lead Channel Pacing Threshold Amplitude: 0.625 V
Lead Channel Pacing Threshold Amplitude: 0.75 V
Lead Channel Pacing Threshold Pulse Width: 0.4 ms
Lead Channel Pacing Threshold Pulse Width: 0.4 ms
Lead Channel Sensing Intrinsic Amplitude: 2.5 mV
Lead Channel Sensing Intrinsic Amplitude: 2.5 mV
Lead Channel Sensing Intrinsic Amplitude: 31.625 mV
Lead Channel Sensing Intrinsic Amplitude: 31.625 mV
Lead Channel Setting Pacing Amplitude: 2 V
Lead Channel Setting Pacing Amplitude: 2.5 V
Lead Channel Setting Pacing Pulse Width: 0.4 ms
Lead Channel Setting Sensing Sensitivity: 0.3 mV

## 2019-07-29 NOTE — Progress Notes (Signed)
Remote ICD transmission.   

## 2019-10-03 ENCOUNTER — Other Ambulatory Visit: Payer: Self-pay

## 2019-10-03 ENCOUNTER — Emergency Department
Admission: EM | Admit: 2019-10-03 | Discharge: 2019-10-04 | Disposition: A | Discharge status: Home / Self Care | Payer: Medicare HMO | Attending: Emergency Medicine | Admitting: Emergency Medicine

## 2019-10-03 DIAGNOSIS — Y907 Blood alcohol level of 200-239 mg/100 ml: Secondary | ICD-10-CM | POA: Diagnosis not present

## 2019-10-03 DIAGNOSIS — F1994 Other psychoactive substance use, unspecified with psychoactive substance-induced mood disorder: Secondary | ICD-10-CM | POA: Insufficient documentation

## 2019-10-03 DIAGNOSIS — F1010 Alcohol abuse, uncomplicated: Secondary | ICD-10-CM | POA: Diagnosis not present

## 2019-10-03 DIAGNOSIS — I10 Essential (primary) hypertension: Secondary | ICD-10-CM | POA: Diagnosis not present

## 2019-10-03 DIAGNOSIS — R45851 Suicidal ideations: Secondary | ICD-10-CM | POA: Diagnosis not present

## 2019-10-03 DIAGNOSIS — Z20822 Contact with and (suspected) exposure to covid-19: Secondary | ICD-10-CM | POA: Diagnosis not present

## 2019-10-03 DIAGNOSIS — Z79899 Other long term (current) drug therapy: Secondary | ICD-10-CM | POA: Insufficient documentation

## 2019-10-03 DIAGNOSIS — Z7982 Long term (current) use of aspirin: Secondary | ICD-10-CM | POA: Diagnosis not present

## 2019-10-03 LAB — ETHANOL: Alcohol, Ethyl (B): 177 mg/dL — ABNORMAL HIGH (ref <10)

## 2019-10-03 LAB — SARS CORONAVIRUS 2 BY RT PCR (HOSPITAL ORDER, PERFORMED IN ~~LOC~~ HOSPITAL LAB): SARS Coronavirus 2: NEGATIVE

## 2019-10-03 LAB — COMPREHENSIVE METABOLIC PANEL
ALT: 54 U/L — ABNORMAL HIGH (ref 0–44)
AST: 59 U/L — ABNORMAL HIGH (ref 15–41)
Albumin: 4.7 g/dL (ref 3.5–5.0)
Alkaline Phosphatase: 57 U/L (ref 38–126)
Anion gap: 12 (ref 5–15)
BUN: 15 mg/dL (ref 6–20)
CO2: 22 mmol/L (ref 22–32)
Calcium: 9.3 mg/dL (ref 8.9–10.3)
Chloride: 104 mmol/L (ref 98–111)
Creatinine, Ser: 0.87 mg/dL (ref 0.61–1.24)
GFR calc Af Amer: >60 mL/min (ref >60)
GFR calc non Af Amer: >60 mL/min (ref >60)
Glucose, Bld: 157 mg/dL — ABNORMAL HIGH (ref 70–99)
Potassium: 3.6 mmol/L (ref 3.5–5.1)
Sodium: 138 mmol/L (ref 135–145)
Total Bilirubin: 1.5 mg/dL — ABNORMAL HIGH (ref 0.3–1.2)
Total Protein: 8.3 g/dL — ABNORMAL HIGH (ref 6.5–8.1)

## 2019-10-03 LAB — CBC
HCT: 40.9 % (ref 39.0–52.0)
Hemoglobin: 13.7 g/dL (ref 13.0–17.0)
MCH: 32.8 pg (ref 26.0–34.0)
MCHC: 33.5 g/dL (ref 30.0–36.0)
MCV: 97.8 fL (ref 80.0–100.0)
Platelets: 235 10*3/uL (ref 150–400)
RBC: 4.18 MIL/uL — ABNORMAL LOW (ref 4.22–5.81)
RDW: 13.4 % (ref 11.5–15.5)
WBC: 5.2 10*3/uL (ref 4.0–10.5)
nRBC: 0 % (ref 0.0–0.2)

## 2019-10-03 LAB — ACETAMINOPHEN LEVEL: Acetaminophen (Tylenol), Serum: <10 ug/mL — ABNORMAL LOW (ref 10–30)

## 2019-10-03 LAB — SALICYLATE LEVEL: Salicylate Lvl: <7 mg/dL — ABNORMAL LOW (ref 7.0–30.0)
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
## 2019-10-03 NOTE — BH Assessment (Signed)
Assessment Note  Marc Bradford is an 59 y.o. male presenting to [facility] ED under IVC. Pt comes with [place] [employer] IVC for SI/HI towards girlfriend. Pt made statements attempting to get officers to shoot him. Pt threatened girlfriend as well. Pt intoxicated. Cooperative and pleasant at this time. Per IVC patient made threats to kill himself and girlfriend, attempted to engage law enforcement into shooting him by telling them that he was armed with a weapon and telling them to shoot him, intoxicated with alcohol, had verbal argument with girl friend prior to interaction with law enforcement. During assessment patient was alert and oriented x4, calm and cooperative, anxious and tearful. When asked why patient was presenting to ED patient reported "me and this same women we keep having issues, we got into a argument on Friday, she left my house but called me last night to come pick her up and take her home, after I come get her she says I ain't going with you, you ugly mother fucker and she called the police" "she's the one that sees a psychiatrist not me." "When the police asked me if I would hurt myself or her I told them I might, because she can't keep picking at me." Patient is now remorseful of his actions and reported "please I need to get out of here, I'm going to have to break up with her, but I also love her." Patient reported that he and his girlfriend have a history of arguments and getting the police called on them. Patient reported that he drinks Alcohol "every weekend, I'll drink a 6 pack." Patient BAL is 177. Patient denies current SI/HI/AH/VH and does not appear to be responding to internal or external stimuli.  Per Psyc NP Elenore Paddy patient is recommended for Inpatient Hospitalization  Diagnosis: Substance-Induced Mood Disorder, Alcohol Use  Past Medical History:  Past Medical History:  Diagnosis Date  . Gout   . Hyperlipidemia   . Hypertension 09/30/2013  . Hypertrophic cardiomyopathy (HCC)  09/30/2013   a. s/p MDT dual chamber ICD implant 03/2014 followed by Dr Graciela Husbands  . OSA (obstructive sleep apnea) 10/28/2014   Severe with AHI 78/hr now on CPAP  . Stroke (HCC) 09/22/2013   a. non-hemorrhagic b. residual left sided weakness    Past Surgical History:  Procedure Laterality Date  . IMPLANTABLE CARDIOVERTER DEFIBRILLATOR IMPLANT N/A 04/06/2014   MDT MRI compatible dual chamber ICD implanted by Dr Graciela Husbands    Family History:  Family History  Problem Relation Age of Onset  . Heart attack Mother   . Sudden death Mother   . Hypertension Mother   . Sudden death Father   . Diabetes Other        family history  . Drug abuse Brother   . Anemia Neg Hx   . Arrhythmia Neg Hx   . Asthma Neg Hx   . Cancer Neg Hx   . Depression Neg Hx   . Heart failure Neg Hx   . Hyperlipidemia Neg Hx   . Kidney disease Neg Hx   . Thyroid disease Neg Hx   . Fainting Neg Hx   . Stroke Neg Hx     Social History:  reports that he has never smoked. His smokeless tobacco use includes snuff. He reports current alcohol use of about 14.0 standard drinks of alcohol per week. He reports that he does not use drugs.  Additional Social History:  Alcohol / Drug Use Pain Medications: See MAR Prescriptions: See MAR Over the Counter:  See MAR History of alcohol / drug use?: Yes Substance #1 Name of Substance 1: Alcohol  CIWA: CIWA-Ar BP: 115/66 Pulse Rate: 66 COWS:    Allergies: No Known Allergies  Home Medications: (Not in a hospital admission)   OB/GYN Status:  No LMP for male patient.  General Assessment Data Location of Assessment: [facility] ED TTS Assessment: In system Is this a Tele or Face-to-Face Assessment?: Face-to-Face Is this an Initial Assessment or a Re-assessment for this encounter?: Initial Assessment Patient Accompanied by:: N/A Language Other than English: No Living Arrangements: Other (Comment) What gender do you identify as?: Male Marital status: Single Pregnancy Status: No Living  Arrangements: Spouse/significant other Can pt return to current living arrangement?: Yes Admission Status: Involuntary Petitioner: Police Is patient capable of signing voluntary admission?: No Referral Source: Other Insurance type: [employer] Medicare  Medical Screening Exam [facility] Walk-in ONLY) Medical Exam completed: Yes  Crisis Care Plan Living Arrangements: Spouse/significant other Legal Guardian: Other: (Self) Name of Psychiatrist: None Name of Therapist: None  Education Status Is patient currently in school?: No Is the patient employed, unemployed or receiving disability?: Unemployed, Receiving disability income  Risk to self with the past 6 months Suicidal Ideation: No-Not Currently/Within Last 6 Months Has patient been a risk to self within the past 6 months prior to admission? : No Suicidal Intent: No Has patient had any suicidal intent within the past 6 months prior to admission? : No Is patient at risk for suicide?: No Suicidal Plan?: No Has patient had any suicidal plan within the past 6 months prior to admission? : No Access to Means: Yes Specify Access to Suicidal Means: Patient had access to a gun What has been your use of drugs/alcohol within the last 12 months?: Alcohol Previous Attempts/Gestures: No How many times?: 0 Other Self Harm Risks: None Triggers for Past Attempts: None known Intentional Self Injurious Behavior: None Family Suicide History: No Recent stressful life event(s): Conflict (Comment) (Relationship conflict with girlfriend) Persecutory voices/beliefs?: No Depression: Yes Depression Symptoms: Feeling angry/irritable, Isolating, Tearfulness Substance abuse history and/or treatment for substance abuse?: Yes Suicide prevention information given to non-admitted patients: Not applicable  Risk to Others within the past 6 months Homicidal Ideation: No-Not Currently/Within Last 6 Months Does patient have any lifetime risk of violence toward others  beyond the six months prior to admission? : No Thoughts of Harm to Others: No-Not Currently Present/Within Last 6 Months Current Homicidal Intent: No Current Homicidal Plan: No Access to Homicidal Means: Yes Describe Access to Homicidal Means: Patient had access to a gun Identified Victim: Patient's girlfriend History of harm to others?: No Assessment of Violence: None Noted Violent Behavior Description: None Does patient have access to weapons?: Yes (Comment) (Patient has access to a gun) Criminal Charges Pending?: No Does patient have a court date: No Is patient on probation?: No  Psychosis Hallucinations: None noted Delusions: None noted  Mental Status Report Appearance/Hygiene: In scrubs Eye Contact: Good Motor Activity: Freedom of movement Speech: Logical/coherent Level of Consciousness: Alert Mood: Anxious, Irritable Affect: Anxious, Appropriate to circumstance Anxiety Level: Moderate Thought Processes: Coherent Judgement: Unimpaired Orientation: Person, Place, Time, Situation, Appropriate for developmental age Obsessive Compulsive Thoughts/Behaviors: None  Cognitive Functioning Concentration: Normal Memory: Recent Intact, Remote Intact Is patient IDD: No Insight: Fair Impulse Control: Fair Appetite: Good Have you had any weight changes? : No Change Sleep: No Change Total Hours of Sleep: 8 Vegetative Symptoms: None  ADLScreening [facility] Assessment Services) Patient's cognitive ability adequate to safely complete daily activities?:  Yes Patient able to express need for assistance with ADLs?: Yes Independently performs ADLs?: Yes (appropriate for developmental age)  Prior Inpatient Therapy Prior Inpatient Therapy: No  Prior Outpatient Therapy Prior Outpatient Therapy: No Does patient have an ACCT team?: No Does patient have Intensive In-House Services?  : No Does patient have [facility] services? : No Does patient have P4CC services?: No  ADL Screening  (condition at time of admission) Patient's cognitive ability adequate to safely complete daily activities?: Yes Is the patient deaf or have difficulty hearing?: No Does the patient have difficulty concentrating, remembering, or making decisions?: No Patient able to express need for assistance with ADLs?: Yes Does the patient have difficulty dressing or bathing?: No Independently performs ADLs?: Yes (appropriate for developmental age) Does the patient have difficulty walking or climbing stairs?: No Weakness of Legs: None Weakness of Arms/Hands: None  Home Assistive Devices/Equipment Home Assistive Devices/Equipment: None  Therapy Consults (therapy consults require a physician order) PT Evaluation Needed: No OT Evalulation Needed: No SLP Evaluation Needed: No Abuse/Neglect Assessment (Assessment to be complete while patient is alone) Abuse/Neglect Assessment Can Be Completed: Yes Physical Abuse: Denies Verbal Abuse: Denies Sexual Abuse: Denies Exploitation of patient/patient's resources: Denies Self-Neglect: Denies Values / Beliefs Cultural Requests During Hospitalization: None Spiritual Requests During Hospitalization: None Consults Spiritual Care Consult Needed: No Transition of Care Team Consult Needed: No Advance Directives (For Healthcare) Does Patient Have a Medical Advance Directive?: No          Disposition: Per Psyc NP Elenore Paddy patient is recommended for Inpatient Hospitalization Disposition Initial Assessment Completed for this Encounter: Yes  On Site Evaluation by:   Reviewed with Physician:    Benay Pike MS LCASA 10/03/2019 10:49 PM

## 2019-10-03 NOTE — ED Notes (Signed)
Hourly rounding reveals patient sleeping in room. No complaints, stable, in no acute distress. Q15 minute rounds and monitoring via Security Cameras to continue. 

## 2019-10-03 NOTE — ED Notes (Signed)
Pt given meal tray.

## 2019-10-03 NOTE — ED Notes (Signed)
Pt. Transferred to BHU from ED to room 2 after screening for contraband. Report to include Situation, Background, Assessment and Recommendations from Ann RN. Pt. Oriented to unit including Q15 minute rounds as well as the security cameras for their protection. Patient is alert and oriented, warm and dry in no acute distress. Patient denies SI, HI, and AVH. Pt. Encouraged to let me know if needs arise. 

## 2019-10-03 NOTE — ED Notes (Signed)
Snack tray and drink provided 

## 2019-10-03 NOTE — ED Triage Notes (Signed)
Pt comes with [place] [employer] IVC for SI/HI towards girlfriend. Pt made statements attempting to get officers to shoot him. Pt threatened girlfriend as well. Pt intoxicated. Cooperative and pleasant at this time.

## 2019-10-03 NOTE — ED Notes (Signed)
Hourly rounding reveals patient awake in room. No complaints, stable, in no acute distress. Q15 minute rounds and monitoring via Security Cameras to continue. 

## 2019-10-03 NOTE — BH Assessment (Signed)
Staffed patient with BMU, BMU charge nurse Britta Mccreedy would like patient to be observed overnight due to current medical issues. Patient to be reviewed with BMU tomorrow morning 10/04/19

## 2019-10-04 ENCOUNTER — Emergency Department: Payer: Medicare HMO

## 2019-10-04 MED ORDER — ACETAMINOPHEN 500 MG PO TABS
1000.0000 mg | ORAL_TABLET | Freq: Once | ORAL | Status: AC
  Administered 2019-10-04: 1000 mg via ORAL
  Filled 2019-10-04: qty 2

## 2019-10-04 NOTE — Consult Note (Signed)
[facility] Face-to-Face Psychiatry Consult   Reason for Consult:  Intoxication / argument with  Referring Physician:   ER MD  Patient Identification: Marc Bradford MRN:  [id number] Principal Diagnosis:   ETOH intoxication  Marital Discord      Diagnosis:   Same    Total Time spent with patient: 30-40  Subjective:   Marc Bradford is a 59 y.o. male patient admitted with  Above ---issues     HPI:  Originally ---on IVC now being released   He was stating harmful and Self harmful behaviors under the influence --of ETOH  That  Is why he made irresponsible statements he said  Now states he is safe and wants to go home  He has no thoughts of harming himself  Or other now   He has come down from effects of ETOH  Now and wants ---to go home     Past Psychiatric History:  None except ong and off ETOH and intoxication  Risk to Self: Suicidal Ideation: No-Not Currently/Within Last 6 Months Suicidal Intent: No Is patient at risk for suicide?: No Suicidal Plan?: No Access to Means: Yes Specify Access to Suicidal Means: Patient had access to a gun What has been your use of drugs/alcohol within the last 12 months?: Alcohol How many times?: 0 Other Self Harm Risks: None Triggers for Past Attempts: None known Intentional Self Injurious Behavior: None Risk to Others: Homicidal Ideation: No-Not Currently/Within Last 6 Months Thoughts of Harm to Others: No-Not Currently Present/Within Last 6 Months Current Homicidal Intent: No Current Homicidal Plan: No Access to Homicidal Means: Yes Describe Access to Homicidal Means: Patient had access to a gun Identified Victim: Patient's girlfriend History of harm to others?: No Assessment of Violence: None Noted Violent Behavior Description: None Does patient have access to weapons?: Yes (Comment) (Patient has access to a gun) Criminal Charges Pending?: No Does patient have a court date: No Prior Inpatient Therapy: Prior Inpatient Therapy:  No Prior Outpatient Therapy: Prior Outpatient Therapy: No Does patient have an ACCT team?: No Does patient have Intensive In-House Services?  : No Does patient have [facility] services? : No Does patient have P4CC services?: No  Past Medical History:  Past Medical History:  Diagnosis Date  . Gout   . Hyperlipidemia   . Hypertension 09/30/2013  . 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 s/p MDT dual chamber ICD implant 03/2014 followed by Dr Graciela Husbands  . OSA (obstructive sleep apnea) 10/28/2014   Severe with AHI 78/hr now on CPAP  . Stroke (HCC) 09/22/2013   a. non-hemorrhagic b. residual left sided weakness    Past Surgical History:  Procedure Laterality Date  . IMPLANTABLE CARDIOVERTER DEFIBRILLATOR IMPLANT N/A 04/06/2014   MDT MRI compatible dual chamber ICD implanted by Dr Graciela Husbands   Family History:  Family History  Problem Relation Age of Onset  . Heart attack Mother   . Sudden death Mother   . Hypertension Mother   . Sudden death Father   . Diabetes Other        family history  . Drug abuse Brother   . Anemia Neg Hx   . Arrhythmia Neg Hx   . Asthma Neg Hx   . Cancer Neg Hx   . Depression Neg Hx   . Heart failure Neg Hx   . Hyperlipidemia Neg Hx   . Kidney disease Neg Hx   . Thyroid disease Neg Hx   . Fainting Neg Hx   . Stroke Neg Hx  Family Psychiatric  History: *none he said  Social History:  Social History   Substance and Sexual Activity  Alcohol Use Yes  . Alcohol/week: 14.0 standard drinks  . Types: 14 Cans of beer per week     Social History   Substance and Sexual Activity  Drug Use No    Social History   Socioeconomic History  . Marital status: Widowed    Spouse name: Not on file  . Number of children: Not on file  . Years of education: Not on file  . Highest education level: Not on file  Occupational History  . Not on file  Tobacco Use  . Smoking status: Never Smoker  . Smokeless tobacco: Current User    Types: Snuff  Vaping Use  .  Vaping Use: Never used  Substance and Sexual Activity  . Alcohol use: Yes    Alcohol/week: 14.0 standard drinks    Types: 14 Cans of beer per week  . Drug use: No  . Sexual activity: Yes  Other Topics Concern  . Not on file  Social History Narrative  . Not on file   Social Determinants of Health   Financial Resource Strain:   . Difficulty of Paying Living Expenses:   Food Insecurity:   . Worried About Programme researcher, broadcasting/film/video in the Last Year:   . Barista in the Last Year:   Transportation Needs:   . Freight forwarder (Medical):   Marland Kitchen Lack of Transportation (Non-Medical):   Physical Activity:   . Days of Exercise per Week:   . Minutes of Exercise per Session:   Stress:   . Feeling of Stress :   Social Connections:   . Frequency of Communication with Friends and Family:   . Frequency of Social Gatherings with Friends and Family:   . Attends Religious Services:   . Active Member of Clubs or Organizations:   . Attends Banker Meetings:   Marland Kitchen Marital Status:    Additional Social History:-----none   He says he lives with GF --and has no interest in recovery     Allergies:  No Known Allergies  Labs:  Results for orders placed or performed during the hospital encounter of 10/03/19 (from the past 48 hour(s))  Comprehensive metabolic panel     Status: Abnormal   Collection Time: 10/03/19  6:09 PM  Result Value Ref Range   Sodium 138 135 - 145 mmol/L   Potassium 3.6 3.5 - 5.1 mmol/L   Chloride 104 98 - 111 mmol/L   CO2 22 22 - 32 mmol/L   Glucose, Bld 157 (H) 70 - 99 mg/dL    Comment: Glucose reference range applies only to samples taken after fasting for at least 8 hours.   BUN 15 6 - 20 mg/dL   Creatinine, Ser 1.61 0.61 - 1.24 mg/dL   Calcium 9.3 8.9 - 09.6 mg/dL   Total Protein 8.3 (H) 6.5 - 8.1 g/dL   Albumin 4.7 3.5 - 5.0 g/dL   AST 59 (H) 15 - 41 U/L   ALT 54 (H) 0 - 44 U/L   Alkaline Phosphatase 57 38 - 126 U/L   Total Bilirubin 1.5 (H) 0.3 -  1.2 mg/dL   GFR calc non Af Amer >60 >60 mL/min   GFR calc Af Amer >60 >60 mL/min   Anion gap 12 5 - 15    Comment: Performed at [facility], [address], [place], [place] [address]  Ethanol  Status: Abnormal   Collection Time: 10/03/19  6:09 PM  Result Value Ref Range   Alcohol, Ethyl (B) 177 (H) <10 mg/dL    Comment: (NOTE) Lowest detectable limit for serum alcohol is 10 mg/dL.  For medical purposes only. Performed at [facility], [address] [place], [place] [address]   Salicylate level     Status: Abnormal   Collection Time: 10/03/19  6:09 PM  Result Value Ref Range   Salicylate Lvl <7.0 (L) 7.0 - 30.0 mg/dL    Comment: Performed at [facility], [address] [place], [place] [address]  Acetaminophen level     Status: Abnormal   Collection Time: 10/03/19  6:09 PM  Result Value Ref Range   Acetaminophen (Tylenol), Serum <10 (L) 10 - 30 ug/mL    Comment: (NOTE) Therapeutic concentrations vary significantly. A range of 10-30 ug/mL  may be an effective concentration for many patients. However, some  are best treated at concentrations outside of this range. Acetaminophen concentrations >150 ug/mL at 4 hours after ingestion  and >50 ug/mL at 12 hours after ingestion are often associated with  toxic reactions.  Performed at [facility], [address] [place], [place] [address]   cbc     Status: Abnormal   Collection Time: 10/03/19  6:09 PM  Result Value Ref Range   WBC 5.2 4.0 - 10.5 K/uL   RBC 4.18 (L) 4.22 - 5.81 MIL/uL   Hemoglobin 13.7 13.0 - 17.0 g/dL   HCT 15.4 39 - 52 %   MCV 97.8 80.0 - 100.0 fL   MCH 32.8 26.0 - 34.0 pg   MCHC 33.5 30.0 - 36.0 g/dL   RDW 00.8 67.6 - 19.5 %   Platelets 235 150 - 400 K/uL   nRBC 0.0 0.0 - 0.2 %    Comment: Performed at [facility], [address] [place], [place] [address]  SARS Coronavirus 2 by RT PCR (hospital order, performed in [facility] hospital lab)  Nasopharyngeal Nasopharyngeal Swab     Status: None   Collection Time: 10/03/19  7:16 PM   Specimen: Nasopharyngeal Swab  Result Value Ref Range   SARS Coronavirus 2 NEGATIVE NEGATIVE    Comment: (NOTE) SARS-CoV-2 target nucleic acids are NOT DETECTED.  The SARS-CoV-2 RNA is generally detectable in upper and lower respiratory specimens during the acute phase of infection. The lowest concentration of SARS-CoV-2 viral copies this assay can detect is 250 copies / mL. A negative result does not preclude SARS-CoV-2 infection and should not be used as the sole basis for treatment or other patient management decisions.  A negative result may occur with improper specimen collection / handling, submission of specimen other than nasopharyngeal swab, presence of viral mutation(s) within the areas targeted by this assay, and inadequate number of viral copies (<250 copies / mL). A negative result must be combined with clinical observations, patient history, and epidemiological information.  Fact Sheet for Patients:   BoilerBrush.com.cy  Fact Sheet for Healthcare Providers: https://pope.com/  This test is not yet approved or  cleared by the [place] FDA and has been authorized for detection and/or diagnosis of SARS-CoV-2 by FDA under an Emergency Use Authorization (EUA).  This EUA will remain in effect (meaning this test can be used) for the duration of the COVID-19 declaration under Section 564(b)(1) of the Act, 21 U.S.C. section 360bbb-3(b)(1), unless the authorization is terminated or revoked sooner.  Performed at [facility], [address]  [place], [place] [id number]     No current facility-administered medications for this encounter.   Current Outpatient Medications  Medication Sig Dispense Refill  . allopurinol (ZYLOPRIM) 300 MG tablet Take 300 mg by mouth daily.    Marland Kitchen. amLODipine (NORVASC) 2.5 MG tablet Take 2.5 mg by  mouth daily.    Marland Kitchen. aspirin EC 81 MG tablet Take 81 mg by mouth daily.    Marland Kitchen. atorvastatin (LIPITOR) 80 MG tablet Take 80 mg by mouth at bedtime.    . Cholecalciferol (VITAMIN D PO) Take 1 tablet by mouth daily.    Marland Kitchen. lisinopril (ZESTRIL) 40 MG tablet Take 40 mg by mouth daily.      Musculoskeletal: Strength & Muscle Tone: normal  Gait & Station: normal  Patient leans: n/a   Psychiatric Specialty Exam: Physical Exam  Review of Systems  Blood pressure 115/66, pulse 66, temperature 98.2 F (36.8 C), temperature source Oral, resp. rate 18, height 5\' 6"  (1.676 m), weight 110.2 kg, SpO2 99 %.Body mass index is 39.22 kg/m.    Mental Status   Alert cooperative oriented to person place and time Not clouded or fluctuant Mood okay affect okay Thought process and content  Normal Judgement insight reliability fair SI and HI none Memory remote recent and immediate --intact No movement issues Concentration and attention okay  Consciousness not clouded or fluctuant Abstraction fair  Rapport and eye contact okay  Fund of knowledge, intelligence,below average   Leans  N/a Musculoskeletal none ADL's okay Recall okay Cognition okay Gait and station okay Language okay  Akathisia normal                                                          Treatment Plan Summary:   He elects to go home with IVC rescinded  Contracts for safety at discharge   Disposition:   Home at his request    Roselind Messieramakrishna Raif Chachere, MD 10/04/2019 11:28 AM

## 2019-10-04 NOTE — ED Notes (Signed)
Hourly rounding reveals patient sleeping in room. No complaints, stable, in no acute distress. Q15 minute rounds and monitoring via Security Cameras to continue. 

## 2019-10-04 NOTE — ED Notes (Signed)
Dr. York Cerise here to see patient.

## 2019-10-04 NOTE — ED Notes (Signed)
Patient voices understanding of discharge instructions, all belongings given back to Patient, Patient also was given a bus ticket to get back to [place], Patient is calm and cooperative.

## 2019-10-04 NOTE — ED Provider Notes (Signed)
Procedures     ----------------------------------------- 12:03 PM on 10/04/2019 -----------------------------------------  Psychiatry consult note reviewed.  Patient now awake alert, clinically sober, no SI HI or hallucinations, no danger to himself or others.  IVC has been rescinded by psychiatry.  Stable for discharge home.    Sharman Cheek, MD 10/04/19 [phone]

## 2019-10-04 NOTE — ED Notes (Signed)
Patient to xray with RN and Officer.

## 2019-10-04 NOTE — BH Assessment (Signed)
Late entry- TTS completed reassessment. Pt presents alert, calm and oriented x 3. Pt reports to feel ready to go home. Pt admitted to drinking but denies intoxication or exhibiting any SI/HI yesterday.  Pt denies a SA hx or the need for any OPT resources. Pt denies any current SI/HI/AH/VH and contracted for safety.   Per Dr. Smith Robert pt will be discharged
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
## 2019-10-04 NOTE — ED Notes (Signed)
Patient is up to door, asking to see the Doctor, Nurse told him it was usually around noon, but would keep him updated, no signs of distress, will continue to monitor.

## 2019-10-04 NOTE — ED Notes (Signed)
Patient ate 100% of breakfast and beverage.  
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
## 2019-10-27 ENCOUNTER — Ambulatory Visit (INDEPENDENT_AMBULATORY_CARE_PROVIDER_SITE_OTHER): Payer: Medicare HMO | Admitting: *Deleted

## 2019-10-27 DIAGNOSIS — I422 Other hypertrophic cardiomyopathy: Secondary | ICD-10-CM

## 2019-10-30 LAB — CUP PACEART REMOTE DEVICE CHECK
Battery Remaining Longevity: 48 mo
Battery Voltage: 2.97 V
Brady Statistic AP VP Percent: 0.06 %
Brady Statistic AP VS Percent: 26.69 %
Brady Statistic AS VP Percent: 0.04 %
Brady Statistic AS VS Percent: 73.21 %
Brady Statistic RA Percent Paced: 26.57 %
Brady Statistic RV Percent Paced: 0.11 %
Date Time Interrogation Session: 20210910232825
HighPow Impedance: 72 Ohm
Implantable Lead Implant Date: 20160217
Implantable Lead Implant Date: 20160217
Implantable Lead Location: 753859
Implantable Lead Location: 753860
Implantable Lead Model: 5076
Implantable Pulse Generator Implant Date: 20160217
Lead Channel Impedance Value: 342 Ohm
Lead Channel Impedance Value: 418 Ohm
Lead Channel Impedance Value: 475 Ohm
Lead Channel Pacing Threshold Amplitude: 0.625 V
Lead Channel Pacing Threshold Amplitude: 0.75 V
Lead Channel Pacing Threshold Pulse Width: 0.4 ms
Lead Channel Pacing Threshold Pulse Width: 0.4 ms
Lead Channel Sensing Intrinsic Amplitude: 2.75 mV
Lead Channel Sensing Intrinsic Amplitude: 2.75 mV
Lead Channel Sensing Intrinsic Amplitude: 31.625 mV
Lead Channel Sensing Intrinsic Amplitude: 31.625 mV
Lead Channel Setting Pacing Amplitude: 2 V
Lead Channel Setting Pacing Amplitude: 2.5 V
Lead Channel Setting Pacing Pulse Width: 0.4 ms
Lead Channel Setting Sensing Sensitivity: 0.3 mV

## 2019-11-02 NOTE — Progress Notes (Signed)
Remote ICD transmission.   

## 2020-01-26 ENCOUNTER — Ambulatory Visit (INDEPENDENT_AMBULATORY_CARE_PROVIDER_SITE_OTHER): Payer: Medicare HMO

## 2020-01-26 DIAGNOSIS — I422 Other hypertrophic cardiomyopathy: Secondary | ICD-10-CM

## 2020-01-27 LAB — CUP PACEART REMOTE DEVICE CHECK
Battery Remaining Longevity: 46 mo
Battery Voltage: 2.97 V
Brady Statistic AP VP Percent: 0.07 %
Brady Statistic AP VS Percent: 22.23 %
Brady Statistic AS VP Percent: 0.05 %
Brady Statistic AS VS Percent: 77.65 %
Brady Statistic RA Percent Paced: 22.08 %
Brady Statistic RV Percent Paced: 0.13 %
Date Time Interrogation Session: 20211208044224
HighPow Impedance: 69 Ohm
Implantable Lead Implant Date: 20160217
Implantable Lead Implant Date: 20160217
Implantable Lead Location: 753859
Implantable Lead Location: 753860
Implantable Lead Model: 5076
Implantable Pulse Generator Implant Date: 20160217
Lead Channel Impedance Value: 342 Ohm
Lead Channel Impedance Value: 418 Ohm
Lead Channel Impedance Value: 456 Ohm
Lead Channel Pacing Threshold Amplitude: 0.625 V
Lead Channel Pacing Threshold Amplitude: 0.75 V
Lead Channel Pacing Threshold Pulse Width: 0.4 ms
Lead Channel Pacing Threshold Pulse Width: 0.4 ms
Lead Channel Sensing Intrinsic Amplitude: 2.875 mV
Lead Channel Sensing Intrinsic Amplitude: 2.875 mV
Lead Channel Sensing Intrinsic Amplitude: 31.625 mV
Lead Channel Sensing Intrinsic Amplitude: 31.625 mV
Lead Channel Setting Pacing Amplitude: 2 V
Lead Channel Setting Pacing Amplitude: 2.5 V
Lead Channel Setting Pacing Pulse Width: 0.4 ms
Lead Channel Setting Sensing Sensitivity: 0.3 mV

## 2020-02-08 NOTE — Progress Notes (Signed)
Remote ICD transmission.   

## 2020-05-25 ENCOUNTER — Encounter: Payer: Medicare HMO | Admitting: Internal Medicine

## 2020-06-13 ENCOUNTER — Emergency Department: Payer: Medicare HMO

## 2020-06-13 ENCOUNTER — Emergency Department
Admission: EM | Admit: 2020-06-13 | Discharge: 2020-06-13 | Disposition: A | Discharge status: Home / Self Care | Payer: Medicare HMO | Attending: Emergency Medicine | Admitting: Emergency Medicine

## 2020-06-13 ENCOUNTER — Encounter: Payer: Self-pay | Admitting: Emergency Medicine

## 2020-06-13 ENCOUNTER — Other Ambulatory Visit: Payer: Self-pay

## 2020-06-13 DIAGNOSIS — Z7982 Long term (current) use of aspirin: Secondary | ICD-10-CM | POA: Insufficient documentation

## 2020-06-13 DIAGNOSIS — I509 Heart failure, unspecified: Secondary | ICD-10-CM | POA: Diagnosis not present

## 2020-06-13 DIAGNOSIS — Y92410 Unspecified street and highway as the place of occurrence of the external cause: Secondary | ICD-10-CM | POA: Insufficient documentation

## 2020-06-13 DIAGNOSIS — F17220 Nicotine dependence, chewing tobacco, uncomplicated: Secondary | ICD-10-CM | POA: Insufficient documentation

## 2020-06-13 DIAGNOSIS — Z79899 Other long term (current) drug therapy: Secondary | ICD-10-CM | POA: Insufficient documentation

## 2020-06-13 DIAGNOSIS — I110 Hypertensive heart disease with heart failure: Secondary | ICD-10-CM | POA: Insufficient documentation

## 2020-06-13 DIAGNOSIS — S0003XA Contusion of scalp, initial encounter: Secondary | ICD-10-CM | POA: Insufficient documentation

## 2020-06-13 DIAGNOSIS — S060X1A Concussion with loss of consciousness of 30 minutes or less, initial encounter: Secondary | ICD-10-CM | POA: Insufficient documentation

## 2020-06-13 DIAGNOSIS — S060X9A Concussion with loss of consciousness of unspecified duration, initial encounter: Secondary | ICD-10-CM | POA: Diagnosis present

## 2020-06-13 DIAGNOSIS — R0789 Other chest pain: Secondary | ICD-10-CM | POA: Diagnosis not present

## 2020-06-13 LAB — CBC WITH DIFFERENTIAL/PLATELET
Abs Immature Granulocytes: 0.01 10*3/uL (ref 0.00–0.07)
Basophils Absolute: 0 10*3/uL (ref 0.0–0.1)
Basophils Relative: 1 %
Eosinophils Absolute: 0.1 10*3/uL (ref 0.0–0.5)
Eosinophils Relative: 3 %
HCT: 40 % (ref 39.0–52.0)
Hemoglobin: 13.4 g/dL (ref 13.0–17.0)
Immature Granulocytes: 0 %
Lymphocytes Relative: 32 %
Lymphs Abs: 1.5 10*3/uL (ref 0.7–4.0)
MCH: 32.4 pg (ref 26.0–34.0)
MCHC: 33.5 g/dL (ref 30.0–36.0)
MCV: 96.6 fL (ref 80.0–100.0)
Monocytes Absolute: 0.5 10*3/uL (ref 0.1–1.0)
Monocytes Relative: 10 %
Neutro Abs: 2.4 10*3/uL (ref 1.7–7.7)
Neutrophils Relative %: 54 %
Platelets: 196 10*3/uL (ref 150–400)
RBC: 4.14 MIL/uL — ABNORMAL LOW (ref 4.22–5.81)
RDW: 13.2 % (ref 11.5–15.5)
WBC: 4.5 10*3/uL (ref 4.0–10.5)
nRBC: 0 % (ref 0.0–0.2)

## 2020-06-13 LAB — TROPONIN I (HIGH SENSITIVITY): Troponin I (High Sensitivity): 18 ng/L — ABNORMAL HIGH (ref <18)
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
## 2020-06-17 ENCOUNTER — Emergency Department
Admission: EM | Admit: 2020-06-17 | Discharge: 2020-06-17 | Disposition: A | Discharge status: Home / Self Care | Payer: Medicare HMO | Attending: Physician Assistant | Admitting: Physician Assistant

## 2020-06-17 ENCOUNTER — Other Ambulatory Visit: Payer: Self-pay

## 2020-06-17 DIAGNOSIS — F17220 Nicotine dependence, chewing tobacco, uncomplicated: Secondary | ICD-10-CM | POA: Diagnosis not present

## 2020-06-17 DIAGNOSIS — I10 Essential (primary) hypertension: Secondary | ICD-10-CM | POA: Insufficient documentation

## 2020-06-17 DIAGNOSIS — Z79899 Other long term (current) drug therapy: Secondary | ICD-10-CM | POA: Insufficient documentation

## 2020-06-17 DIAGNOSIS — S39012A Strain of muscle, fascia and tendon of lower back, initial encounter: Secondary | ICD-10-CM

## 2020-06-17 DIAGNOSIS — S3992XA Unspecified injury of lower back, initial encounter: Secondary | ICD-10-CM | POA: Diagnosis present

## 2020-06-17 DIAGNOSIS — Z7982 Long term (current) use of aspirin: Secondary | ICD-10-CM | POA: Insufficient documentation

## 2020-06-17 DIAGNOSIS — Y92410 Unspecified street and highway as the place of occurrence of the external cause: Secondary | ICD-10-CM | POA: Insufficient documentation

## 2020-06-17 MED ORDER — ORPHENADRINE CITRATE ER 100 MG PO TB12: 100.0000 mg | ORAL_TABLET | Freq: Two times a day (BID) | ORAL | 1 refills | Status: AC

## 2020-06-17 MED ORDER — LIDOCAINE 5 % EX PTCH
1.0000 | MEDICATED_PATCH | CUTANEOUS | Status: DC
  Administered 2020-06-17: 1 via TRANSDERMAL
  Filled 2020-06-17: qty 1

## 2020-06-17 MED ORDER — NAPROXEN 500 MG PO TABS
500.0000 mg | ORAL_TABLET | Freq: Two times a day (BID) | ORAL | Status: DC
Start: 2020-06-17 — End: 2022-08-19
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
## 2020-06-17 NOTE — Discharge Instructions (Signed)
Follow discharge care instruction take medication as directed. °
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
## 2020-06-24 ENCOUNTER — Other Ambulatory Visit: Payer: Self-pay

## 2020-06-24 ENCOUNTER — Encounter (HOSPITAL_COMMUNITY): Payer: Self-pay | Admitting: Emergency Medicine

## 2020-06-24 ENCOUNTER — Emergency Department (HOSPITAL_COMMUNITY)
Admission: EM | Admit: 2020-06-24 | Discharge: 2020-06-24 | Disposition: A | Discharge status: Home / Self Care | Payer: Medicare Other | Attending: Emergency Medicine | Admitting: Emergency Medicine

## 2020-06-24 ENCOUNTER — Emergency Department (HOSPITAL_COMMUNITY): Payer: Medicare Other

## 2020-06-24 DIAGNOSIS — M4726 Other spondylosis with radiculopathy, lumbar region: Secondary | ICD-10-CM | POA: Diagnosis not present

## 2020-06-24 DIAGNOSIS — Z79899 Other long term (current) drug therapy: Secondary | ICD-10-CM | POA: Insufficient documentation

## 2020-06-24 DIAGNOSIS — I10 Essential (primary) hypertension: Secondary | ICD-10-CM | POA: Insufficient documentation

## 2020-06-24 DIAGNOSIS — Y92410 Unspecified street and highway as the place of occurrence of the external cause: Secondary | ICD-10-CM | POA: Insufficient documentation

## 2020-06-24 DIAGNOSIS — R200 Anesthesia of skin: Secondary | ICD-10-CM | POA: Diagnosis not present

## 2020-06-24 DIAGNOSIS — M545 Low back pain, unspecified: Secondary | ICD-10-CM | POA: Diagnosis present

## 2020-06-24 DIAGNOSIS — M25552 Pain in left hip: Secondary | ICD-10-CM | POA: Insufficient documentation

## 2020-06-24 DIAGNOSIS — Z7982 Long term (current) use of aspirin: Secondary | ICD-10-CM | POA: Insufficient documentation

## 2020-06-24 MED ORDER — LIDOCAINE 5 % EX PTCH: 1.0000 | MEDICATED_PATCH | CUTANEOUS | 0 refills | Status: DC

## 2020-06-24 NOTE — Discharge Instructions (Signed)
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  You can continue with the medications prescribed previously.  You may also apply Lidoderm patches as needed as directed.  Recommend follow-up with your primary care provider.

## 2020-06-24 NOTE — ED Notes (Signed)
Patient is in radiology.  
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
## 2020-06-29 ENCOUNTER — Ambulatory Visit: Payer: Medicare HMO | Admitting: Internal Medicine

## 2020-06-29 ENCOUNTER — Encounter: Payer: Self-pay | Admitting: Internal Medicine

## 2020-06-29 ENCOUNTER — Other Ambulatory Visit: Payer: Self-pay

## 2020-06-29 VITALS — BP 120/70 | HR 60 | Ht 66.0 in | Wt 231.0 lb

## 2020-06-29 DIAGNOSIS — I422 Other hypertrophic cardiomyopathy: Secondary | ICD-10-CM

## 2020-06-29 DIAGNOSIS — I472 Ventricular tachycardia: Secondary | ICD-10-CM

## 2020-06-29 DIAGNOSIS — I5030 Unspecified diastolic (congestive) heart failure: Secondary | ICD-10-CM

## 2020-06-29 DIAGNOSIS — I4729 Other ventricular tachycardia: Secondary | ICD-10-CM

## 2020-06-29 DIAGNOSIS — Z95810 Presence of automatic (implantable) cardiac defibrillator: Secondary | ICD-10-CM | POA: Diagnosis not present

## 2020-06-29 NOTE — Progress Notes (Signed)
Patient Care Team: Oswaldo Conroy, MD as PCP - General (Family Medicine) Duke Salvia, MD as PCP - Cardiology (Cardiology)   HPI  Marc Bradford is a 60 y.o. male Seen in follow-up for apical hypertrophic cardiomyopathy. MRI demonstrates significant gadolinium enhancement. He had nonsustained ventricular tachycardia and underwent primary prevention ICD Medtronic     He has a history of a prior stroke with residual weakness as well as hypertension    The patient denies chest pain, shortness of breath, nocturnal dyspnea, orthopnea or peripheral edema.  There have been no palpitations, lightheadedness or syncope.    Date Cr K  10/17 0.8 3.4  7/19  0.84 4.2   Obesity and treated sleep apnea  Past Medical History:  Diagnosis Date  . Gout   . Hyperlipidemia   . Hypertension 09/30/2013  . 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 s/p MDT dual chamber ICD implant 03/2014 followed by Dr Graciela Husbands  . OSA (obstructive sleep apnea) 10/28/2014   Severe with AHI 78/hr now on CPAP  . Stroke (HCC) 09/22/2013   a. non-hemorrhagic b. residual left sided weakness    Past Surgical History:  Procedure Laterality Date  . IMPLANTABLE CARDIOVERTER DEFIBRILLATOR IMPLANT N/A 04/06/2014   MDT MRI compatible dual chamber ICD implanted by Dr Graciela Husbands    Current Outpatient Medications  Medication Sig Dispense Refill  . allopurinol (ZYLOPRIM) 300 MG tablet Take 300 mg by mouth daily.    Marland Kitchen amLODipine (NORVASC) 2.5 MG tablet Take 2.5 mg by mouth daily.    Marland Kitchen aspirin EC 81 MG tablet Take 81 mg by mouth daily.    Marland Kitchen atorvastatin (LIPITOR) 80 MG tablet Take 80 mg by mouth at bedtime.    . Cholecalciferol (VITAMIN D PO) Take 1 tablet by mouth daily.    Marland Kitchen gabapentin (NEURONTIN) 300 MG capsule Take 1 capsule by mouth 2 (two) times daily as needed.    . lidocaine (LIDODERM) 5 % Place 1 patch onto the skin daily. Remove & Discard patch within 12 hours or as directed by MD 30 patch 0  . lisinopril  (ZESTRIL) 40 MG tablet Take 40 mg by mouth daily.    . naproxen (NAPROSYN) 500 MG tablet Take 1 tablet (500 mg total) by mouth 2 (two) times daily with a meal. (Patient taking differently: Take 500 mg by mouth as needed.) 20 tablet 00  . orphenadrine (NORFLEX) 100 MG tablet Take 1 tablet (100 mg total) by mouth 2 (two) times daily. 60 tablet 1   No current facility-administered medications for this visit.    No Known Allergies    Review of Systems negative except from HPI and PMH  Physical Exam BP 120/70   Pulse 60   Ht 5\' 6"  (1.676 m)   Wt 231 lb (104.8 kg)   SpO2 95%   BMI 37.28 kg/m  Well developed and Morbidly obese  in no acute distress HENT normal Neck supple with JVP-flat Clear Device pocket well healed; without hematoma or erythema.  There is no tethering  Regular rate and rhythm, no  murmur Abd-soft with active BS No Clubbing cyanosis   edema Skin-warm and dry A & Oriented  Grossly normal sensory and motor function  ECG AV paced at 60 Intervals 30/10/47 T wave inversions in 1, 2, L, V3-V6 Assessment and  Plan  HCM-atypical variant  VT nonsustained  ICD Medtronic    HFpEF  Morbidly obese  AFib  Hypertension  No intercurrent Ventricular tachycardia  No interval atrial fibrillation  Blood pressure well controlled  Euvolemic continue current medications  Alcohol intake is exuberant.  Discussed the important relation of alcohol intake and atrial fibrillation and the consequences of atrial fibrillation in the context of hypertrophic cardiomyopathy and encouraged him to desist  Medicines reviewed

## 2020-06-29 NOTE — Patient Instructions (Signed)

## 2020-07-26 ENCOUNTER — Ambulatory Visit: Payer: Medicare HMO

## 2020-10-25 ENCOUNTER — Ambulatory Visit (INDEPENDENT_AMBULATORY_CARE_PROVIDER_SITE_OTHER): Payer: Medicare Other

## 2020-10-25 DIAGNOSIS — I422 Other hypertrophic cardiomyopathy: Secondary | ICD-10-CM

## 2020-10-26 LAB — CUP PACEART REMOTE DEVICE CHECK
Battery Remaining Longevity: 37 mo
Battery Voltage: 2.95 V
Brady Statistic AP VP Percent: 0.14 %
Brady Statistic AP VS Percent: 23.85 %
Brady Statistic AS VP Percent: 0.06 %
Brady Statistic AS VS Percent: 75.94 %
Brady Statistic RA Percent Paced: 23.65 %
Brady Statistic RV Percent Paced: 0.23 %
Date Time Interrogation Session: 20220907213325
HighPow Impedance: 68 Ohm
Implantable Lead Implant Date: 20160217
Implantable Lead Implant Date: 20160217
Implantable Lead Location: 753859
Implantable Lead Location: 753860
Implantable Lead Model: 5076
Implantable Pulse Generator Implant Date: 20160217
Lead Channel Impedance Value: 342 Ohm
Lead Channel Impedance Value: 418 Ohm
Lead Channel Impedance Value: 456 Ohm
Lead Channel Pacing Threshold Amplitude: 0.625 V
Lead Channel Pacing Threshold Amplitude: 0.625 V
Lead Channel Pacing Threshold Pulse Width: 0.4 ms
Lead Channel Pacing Threshold Pulse Width: 0.4 ms
Lead Channel Sensing Intrinsic Amplitude: 1.875 mV
Lead Channel Sensing Intrinsic Amplitude: 1.875 mV
Lead Channel Sensing Intrinsic Amplitude: 31.625 mV
Lead Channel Sensing Intrinsic Amplitude: 31.625 mV
Lead Channel Setting Pacing Amplitude: 2 V
Lead Channel Setting Pacing Amplitude: 2.5 V
Lead Channel Setting Pacing Pulse Width: 0.4 ms
Lead Channel Setting Sensing Sensitivity: 0.3 mV

## 2020-11-02 NOTE — Progress Notes (Signed)
Remote ICD transmission.   

## 2020-12-27 ENCOUNTER — Telehealth: Payer: Self-pay | Admitting: Internal Medicine

## 2020-12-27 NOTE — Telephone Encounter (Signed)
Trisha, Morandi - 12/27/2020 10:57 AM Quintella Reichert, MD  Sent: Wed December 27, 2020 12:28 PM  To: Loa Socks, LPN          Message  Will need an appointment with me since he has not been seen in over a year in order to get this  ----- Message -----    As indicated above and per Dr. Mayford Knife, pt will need an appt with her for sleep follow-up, being it's been over a year since pt last saw her.  Will forward this message to Veda Canning Sleep Coordinator, to assist the pt with this.

## 2020-12-27 NOTE — Telephone Encounter (Signed)
Patient called and was trying to get a new cpap machine. He said his insurance will pay for it but he needs a new order for one. He is not sure if he needs to contact us or his PCP

## 2020-12-27 NOTE — Telephone Encounter (Signed)
Pt was calling to ask Dr. Graciela Husbands or Dr Mayford Knife (follows pt for sleep), if they would be able to assist him in getting a new CPAP machine.   Pt states he has new insurance coverage and card, and insurance informed him that they would pay for him to get a new CPAP machine, and to reach out to the ordering Provider for this request.  Informed the pt that Dr. Mayford Knife and Veda Canning Sleep Coordinator are out of the office today, but I will route this message to them both to further review and advise on this matter, then follow-up with the pt accordingly thereafter. Pt verbalized understanding and agrees with this plan.  He will await a call back from [place].

## 2020-12-27 NOTE — Telephone Encounter (Signed)
Spoke back with the pt and he confirmed that the reason he needs a new CPAP machine, is because the current one is broke and completely non-functioning.  Will route this information back to Dr. Mayford Knife and sleep coordinator as an [place].

## 2020-12-28 NOTE — Telephone Encounter (Signed)
Reached out to patient and made his appointment for 01/30/21. Pt is agreeable to treatment.

## 2021-01-24 ENCOUNTER — Ambulatory Visit (INDEPENDENT_AMBULATORY_CARE_PROVIDER_SITE_OTHER): Payer: Medicare Other

## 2021-01-24 DIAGNOSIS — I422 Other hypertrophic cardiomyopathy: Secondary | ICD-10-CM | POA: Diagnosis not present

## 2021-01-24 LAB — CUP PACEART REMOTE DEVICE CHECK
Battery Remaining Longevity: 37 mo
Battery Voltage: 2.95 V
Brady Statistic AP VP Percent: 0.19 %
Brady Statistic AP VS Percent: 24.53 %
Brady Statistic AS VP Percent: 0.08 %
Brady Statistic AS VS Percent: 75.2 %
Brady Statistic RA Percent Paced: 24.41 %
Brady Statistic RV Percent Paced: 0.3 %
Date Time Interrogation Session: 20221207031611
HighPow Impedance: 70 Ohm
Implantable Lead Implant Date: 20160217
Implantable Lead Implant Date: 20160217
Implantable Lead Location: 753859
Implantable Lead Location: 753860
Implantable Lead Model: 5076
Implantable Pulse Generator Implant Date: 20160217
Lead Channel Impedance Value: 342 Ohm
Lead Channel Impedance Value: 418 Ohm
Lead Channel Impedance Value: 456 Ohm
Lead Channel Pacing Threshold Amplitude: 0.625 V
Lead Channel Pacing Threshold Amplitude: 0.75 V
Lead Channel Pacing Threshold Pulse Width: 0.4 ms
Lead Channel Pacing Threshold Pulse Width: 0.4 ms
Lead Channel Sensing Intrinsic Amplitude: 1.875 mV
Lead Channel Sensing Intrinsic Amplitude: 1.875 mV
Lead Channel Sensing Intrinsic Amplitude: 31.625 mV
Lead Channel Sensing Intrinsic Amplitude: 31.625 mV
Lead Channel Setting Pacing Amplitude: 2 V
Lead Channel Setting Pacing Amplitude: 2.5 V
Lead Channel Setting Pacing Pulse Width: 0.4 ms
Lead Channel Setting Sensing Sensitivity: 0.3 mV

## 2021-01-30 ENCOUNTER — Ambulatory Visit: Payer: Medicare Other | Admitting: Cardiology

## 2021-02-01 NOTE — Progress Notes (Signed)
Remote ICD transmission.   

## 2021-03-12 ENCOUNTER — Telehealth: Payer: Self-pay

## 2021-03-12 NOTE — Telephone Encounter (Signed)
°  Patient Consent for Virtual Visit        Marc Bradford has provided verbal consent on 03/12/2021 for a virtual visit (video or telephone).   CONSENT FOR VIRTUAL VISIT FOR:  Marc Bradford  By participating in this virtual visit I agree to the following:  I hereby voluntarily request, consent and authorize [facility] and its employed or contracted physicians, physician assistants, nurse practitioners or other licensed health care professionals (the Practitioner), to provide me with telemedicine health care services (the Services") as deemed necessary by the treating Practitioner. I acknowledge and consent to receive the Services by the Practitioner via telemedicine. I understand that the telemedicine visit will involve communicating with the Practitioner through live audiovisual communication technology and the disclosure of certain medical information by electronic transmission. I acknowledge that I have been given the opportunity to request an in-person assessment or other available alternative prior to the telemedicine visit and am voluntarily participating in the telemedicine visit.  I understand that I have the right to withhold or withdraw my consent to the use of telemedicine in the course of my care at any time, without affecting my right to future care or treatment, and that the Practitioner or I may terminate the telemedicine visit at any time. I understand that I have the right to inspect all information obtained and/or recorded in the course of the telemedicine visit and may receive copies of available information for a reasonable fee.  I understand that some of the potential risks of receiving the Services via telemedicine include:  Delay or interruption in medical evaluation due to technological equipment failure or disruption; Information transmitted may not be sufficient (e.g. poor resolution of images) to allow for appropriate medical decision making by the Practitioner; and/or  In  rare instances, security protocols could fail, causing a breach of personal health information.  Furthermore, I acknowledge that it is my responsibility to provide information about my medical history, conditions and care that is complete and accurate to the best of my ability. I acknowledge that Practitioner's advice, recommendations, and/or decision may be based on factors not within their control, such as incomplete or inaccurate data provided by me or distortions of diagnostic images or specimens that may result from electronic transmissions. I understand that the practice of medicine is not an exact science and that Practitioner makes no warranties or guarantees regarding treatment outcomes. I acknowledge that a copy of this consent can be made available to me via my patient portal ([facility]), or I can request a printed copy by calling the office of [facility].    I understand that my insurance will be billed for this visit.   I have read or had this consent read to me. I understand the contents of this consent, which adequately explains the benefits and risks of the Services being provided via telemedicine.  I have been provided ample opportunity to ask questions regarding this consent and the Services and have had my questions answered to my satisfaction. I give my informed consent for the services to be provided through the use of telemedicine in my medical care

## 2021-03-12 NOTE — Progress Notes (Deleted)
Virtual Visit via Telephone Note   This visit type was conducted due to national recommendations for restrictions regarding the COVID-19 Pandemic (e.g. social distancing) in an effort to limit this patient's exposure and mitigate transmission in our community.  Due to his co-morbid illnesses, this patient is at least at moderate risk for complications without adequate follow up.  This format is felt to be most appropriate for this patient at this time.  The patient did not have access to video technology/had technical difficulties with video requiring transitioning to audio format only (telephone).  All issues noted in this document were discussed and addressed.  No physical exam could be performed with this format.  Please refer to the patient's chart for his  consent to telehealth for [facility].   Evaluation Performed:  Follow-up visit  This visit type was conducted due to national recommendations for restrictions regarding the COVID-19 Pandemic (e.g. social distancing).  This format is felt to be most appropriate for this patient at this time.  All issues noted in this document were discussed and addressed.  No physical exam was performed (except for noted visual exam findings with Video Visits).  Please refer to the patient's chart (MyChart message for video visits and phone note for telephone visits) for the patient's consent to telehealth for [facility].  Date:  03/12/2021   ID:  Marc Bradford, DOB Apr 11, 1960, MRN [id number]  Patient Location:  Home  Provider location:   [place]  PCP:  Oswaldo Conroy, MD  Cardiologist:  Sherryl Manges, MD  Sleep Medicine:  Armanda Magic, MD Electrophysiologist:  None   Chief Complaint:  OSA  History of Present Illness:    Marc Bradford is a 61 y.o. male who presents via audio/video conferencing for a telehealth visit today.    Marc Bradford is a 61 y.o. male with a history of severe OSA with an AHI of 77.8/hr and is on CPAP at 11cm H2O.   He is doing well with his CPAP device and thinks that he has gotten used to it.  He tolerates the mask and feels the pressure is adequate.  Since going on CPAP he feels rested in the am and has no significant daytime sleepiness.  He denies any significant mouth or nasal dryness or nasal congestion.  He does not think that he snores.    The patient does not have symptoms concerning for COVID-19 infection (fever, chills, cough, or new shortness of breath).   Prior CV studies:   The following studies were reviewed today:  PAP compliance download  Past Medical History:  Diagnosis Date   Gout    Hyperlipidemia    Hypertension 09/30/2013   Hypertrophic cardiomyopathy (HCC) 09/30/2013   a. s/p MDT dual chamber ICD implant 03/2014 followed by Dr Graciela Husbands   OSA (obstructive sleep apnea) 10/28/2014   Severe with AHI 78/hr now on CPAP   Stroke (HCC) 09/22/2013   a. non-hemorrhagic b. residual left sided weakness   Past Surgical History:  Procedure Laterality Date   IMPLANTABLE CARDIOVERTER DEFIBRILLATOR IMPLANT N/A 04/06/2014   MDT MRI compatible dual chamber ICD implanted by Dr Graciela Husbands     No outpatient medications have been marked as taking for the 03/13/21 encounter (Appointment) with Quintella Reichert, MD.     Allergies:   Patient has no known allergies.   Social History   Tobacco Use   Smoking status: Never   Smokeless tobacco: Current    Types: Snuff  Vaping Use   Vaping  Use: Never used  Substance Use Topics   Alcohol use: Yes    Alcohol/week: 14.0 standard drinks    Types: 14 Cans of beer per week   Drug use: No     Family Hx: The patient's family history includes Diabetes in an other family member; Drug abuse in his brother; Heart attack in his mother; Hypertension in his mother; Sudden death in his father and mother. There is no history of Anemia, Arrhythmia, Asthma, Cancer, Depression, Heart failure, Hyperlipidemia, Kidney disease, Thyroid disease, Fainting, or Stroke.  ROS:    Please see the history of present illness.     All other systems reviewed and are negative.   Labs/Other Tests and Data Reviewed:    Recent Labs: 06/13/2020: Hemoglobin 13.4; Platelets 196   Recent Lipid Panel Lab Results  Component Value Date/Time   CHOL 180 09/29/2013 02:38 AM   CHOL 120 05/24/2011 04:44 AM   TRIG 382 (H) 09/29/2013 02:38 AM   TRIG 288 (H) 05/24/2011 04:44 AM   HDL 35 (L) 09/29/2013 02:38 AM   HDL 28 (L) 05/24/2011 04:44 AM   CHOLHDL 5.1 09/29/2013 02:38 AM   LDLCALC 69 09/29/2013 02:38 AM   LDLCALC 34 05/24/2011 04:44 AM    Wt Readings from Last 3 Encounters:  06/29/20 231 lb (104.8 kg)  06/24/20 230 lb (104.3 kg)  06/17/20 242 lb 8.1 oz (110 kg)     Objective:    Vital Signs:  There were no vitals taken for this visit.    ASSESSMENT & PLAN:    1.  OSA - The patient is tolerating PAP therapy well without any problems. The PAP download performed by his DME was personally reviewed and interpreted by me today and showed an AHI of ***/hr on *** cm H2O with ***% compliance in using more than 4 hours nightly.  The patient has been using and benefiting from PAP use and will continue to benefit from therapy.    2.  HTN -BP controlled on exam today -continue prescription drug management with Amlodipine 2.5mg  daily, Lisinopril 40mg  daily and Inderal 80mg  daily with PRN refills  3.  Morbid Obesity -I have encouraged him to get into a routine exercise program and cut back on carbs and portions.   COVID-19 Education: The signs and symptoms of COVID-19 were discussed with the patient and how to seek care for testing (follow up with PCP or arrange E-visit).  The importance of social distancing was discussed today.  Patient Risk:   After full review of this patient's clinical status, I feel that they are at least moderate risk at this time.  Time:   Today, I have spent 20 minutes on telemedicine discussing medical problems including OSA, HTN, Obesity and  reviewing patient's chart including PAP compliance download.  Medication Adjustments/Labs and Tests Ordered: Current medicines are reviewed at length with the patient today.  Concerns regarding medicines are outlined above.  Tests Ordered: No orders of the defined types were placed in this encounter.  Medication Changes: No orders of the defined types were placed in this encounter.   Disposition:  Follow up in 1 year(s)  Signed, , MD  03/12/2021 5:00 PM    [facility]

## 2021-03-13 ENCOUNTER — Other Ambulatory Visit: Payer: Self-pay

## 2021-03-13 ENCOUNTER — Telehealth: Payer: Medicare Other | Admitting: Cardiology

## 2021-03-13 DIAGNOSIS — G4733 Obstructive sleep apnea (adult) (pediatric): Secondary | ICD-10-CM

## 2021-03-13 DIAGNOSIS — I10 Essential (primary) hypertension: Secondary | ICD-10-CM

## 2021-03-14 ENCOUNTER — Other Ambulatory Visit: Payer: Self-pay

## 2021-03-14 ENCOUNTER — Telehealth: Payer: Self-pay | Admitting: *Deleted

## 2021-03-14 ENCOUNTER — Encounter: Payer: Self-pay | Admitting: Cardiology

## 2021-03-14 ENCOUNTER — Telehealth (INDEPENDENT_AMBULATORY_CARE_PROVIDER_SITE_OTHER): Payer: Medicare Other | Admitting: Cardiology

## 2021-03-14 VITALS — BP 120/86 | Ht 66.0 in | Wt 250.0 lb

## 2021-03-14 DIAGNOSIS — G4733 Obstructive sleep apnea (adult) (pediatric): Secondary | ICD-10-CM | POA: Diagnosis not present

## 2021-03-14 DIAGNOSIS — I10 Essential (primary) hypertension: Secondary | ICD-10-CM

## 2021-03-14 NOTE — Telephone Encounter (Signed)
Order placed to [facility]  

## 2021-03-14 NOTE — Progress Notes (Signed)
Virtual Visit via Video Note   This visit type was conducted due to national recommendations for restrictions regarding the COVID-19 Pandemic (e.g. social distancing) in an effort to limit this patient's exposure and mitigate transmission in our community.  Due to his co-morbid illnesses, this patient is at least at moderate risk for complications without adequate follow up.  This format is felt to be most appropriate for this patient at this time.  The patient did not have access to video technology/had technical difficulties with video requiring transitioning to audio format only (telephone).  All issues noted in this document were discussed and addressed. Limited physical exam was completed. Please refer to the patient's chart for his  consent to telehealth for [facility].   Evaluation Performed:  Follow-up visit  Date:  03/14/2021   ID:  Marc Bradford, Marc Bradford [birth year], MRN [id number]  Patient Location:  Home  Provider location:   [place]  PCP:  Letta Median, MD  Cardiologist:  Virl Axe, MD  Sleep Medicine:  Fransico Him, MD Electrophysiologist:  None   Chief Complaint:  OSA  History of Present Illness:    Marc Bradford is a 61 y.o. male who presents via audio/video conferencing for a telehealth visit today.    Marc Bradford is a 61 y.o. male with a history of severe OSA with an AHI of 77.8/hr and is on CPAP at 11cm H2O.  HE is doing well with his CPAP device and thinks that he has gotten used to it.  He tolerates the mask and feels the pressure is adequate.  Since going on CPAP he feels rested in the am and has no significant daytime sleepiness.  He denies any significant mouth or nasal dryness or nasal congestion.  He does not think that he snores.   He tells me that his CPAP has not been working and he needs a new PAP device .  The patient does not have symptoms concerning for COVID-19 infection (fever, chills, cough, or new shortness of breath).   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 s/p MDT dual chamber ICD implant 03/2014 followed by Dr Caryl Comes   OSA (obstructive sleep apnea) 10/28/2014   Severe with AHI 78/hr now on CPAP   Stroke ([place]) 09/22/2013   a. non-hemorrhagic b. residual left sided weakness   Past Surgical History:  Procedure Laterality Date   IMPLANTABLE CARDIOVERTER DEFIBRILLATOR IMPLANT N/A 04/06/2014   MDT MRI compatible dual chamber ICD implanted by Dr Caryl Comes     Current Meds  Medication Sig   allopurinol (ZYLOPRIM) 300 MG tablet Take 300 mg by mouth daily.   amLODipine (NORVASC) 2.5 MG tablet Take 2.5 mg by mouth daily.   aspirin EC 81 MG tablet Take 81 mg by mouth daily.   Cholecalciferol (VITAMIN D PO) Take 1 tablet by mouth daily.   lidocaine (LIDODERM) 5 % Place 1 patch onto the skin daily. Remove & Discard patch within 12 hours or as directed by MD   lisinopril (ZESTRIL) 40 MG tablet Take 40 mg by mouth daily.   naproxen (NAPROSYN) 500 MG tablet Take 1 tablet (500 mg total) by mouth 2 (two) times daily with a meal. (Patient taking differently: Take 500 mg by mouth as needed.)     Allergies:   Patient  has no known allergies.   Social History   Tobacco Use   Smoking status: Never   Smokeless tobacco: Current    Types: Snuff  Vaping Use   Vaping Use: Never used  Substance Use Topics   Alcohol use: Yes    Alcohol/week: 14.0 standard drinks    Types: 14 Cans of beer per week   Drug use: No     Family Hx: The patient's family history includes Diabetes in an other family member; Drug abuse in his brother; Heart attack in his mother; Hypertension in his mother; Sudden death in his father and mother. There is no history of Anemia, Arrhythmia, Asthma, Cancer, Depression, Heart failure, Hyperlipidemia, Kidney disease, Thyroid disease,  Fainting, or Stroke.  ROS:   Please see the history of present illness.     All other systems reviewed and are negative.   Labs/Other Tests and Data Reviewed:    Recent Labs: 06/13/2020: Hemoglobin 13.4; Platelets 196   Recent Lipid Panel Lab Results  Component Value Date/Time   CHOL 180 09/29/2013 02:38 AM   CHOL 120 05/24/2011 04:44 AM   TRIG 382 (H) 09/29/2013 02:38 AM   TRIG 288 (H) 05/24/2011 04:44 AM   HDL 35 (L) 09/29/2013 02:38 AM   HDL 28 (L) 05/24/2011 04:44 AM   CHOLHDL 5.1 09/29/2013 02:38 AM   LDLCALC 69 09/29/2013 02:38 AM   LDLCALC 34 05/24/2011 04:44 AM    Wt Readings from Last 3 Encounters:  03/14/21 250 lb (113.4 kg)  06/29/20 231 lb (104.8 kg)  06/24/20 230 lb (104.3 kg)     Objective:    Vital Signs:  BP 120/86    Ht 5\' 6"  (1.676 m)    Wt 250 lb (113.4 kg)    BMI 40.35 kg/m   Well nourished, well developed male in no acute distress. Well appearing, alert and conversant, regular work of breathing,  good skin color  Eyes- anicteric mouth- oral mucosa is pink  neuro- grossly intact skin- no apparent rash or lesions or cyanosis  ASSESSMENT & PLAN:    1.  OSA - The patient is tolerating PAP therapy well without any problems. The PAP download performed by his DME was personally reviewed and interpreted by me today and showed an AHI of 1.4/hr on 11 cm H2O with 20% compliance in using more than 4 hours nightly.  The patient has been using and benefiting from PAP use and will continue to benefit from therapy.  -I have encouraged him to be more compliant with his device -I will order him a new ResMed CPAP at 11cm H2O with heated humidity and mask of choice since his device stopped working -he will see me back 6 weeks after he gets his new device   2.  HTN -BP is well controlled on exam today -continue prescription drug management with amlodipine 2.5mg  daily and Lisinopril 40mg  daily with PRN refills  3.  Morbid Obesity -I have encouraged him to get into  a routine exercise program and cut back on carbs and portions.   COVID-19 Education: The signs and symptoms of COVID-19 were discussed with the patient and how to seek care for testing (follow up with PCP or arrange E-visit).  The importance of social distancing was discussed today.  Patient Risk:   After full review of this patient's clinical status, I feel that they are at least moderate risk at this time.  Time:   Today, I have spent 10 minutes on telemedicine discussing medical problems  including OSA, HTN, Obesity and reviewing patient's chart including PAP compliance download.  Medication Adjustments/Labs and Tests Ordered: Current medicines are reviewed at length with the patient today.  Concerns regarding medicines are outlined above.  Tests Ordered: No orders of the defined types were placed in this encounter.  Medication Changes: No orders of the defined types were placed in this encounter.   Disposition:  Follow up 6 weeks after he gets new PAP device  Signed, Fransico Him, MD  03/14/2021 3:20 PM    [facility]

## 2021-03-14 NOTE — Patient Instructions (Signed)
Medication Instructions:  °Your physician recommends that you continue on your current medications as directed. Please refer to the Current Medication list given to you today. ° °*If you need a refill on your cardiac medications before your next appointment, please call your pharmacy* ° °Follow-Up: °At [facility], you and your health needs are our priority.  As part of our continuing mission to provide you with exceptional heart care, we have created designated Provider Care Teams.  These Care Teams include your primary Cardiologist (physician) and Advanced Practice Providers (APPs -  Physician Assistants and Nurse Practitioners) who all work together to provide you with the care you need, when you need it. ° °Follow up with Dr. Turner 6 weeks after receiving your new device.  °

## 2021-03-14 NOTE — Telephone Encounter (Signed)
Per dr Mayford Knife, order him a new ResMed CPAP at 11cm H2O with heated humidity and mask of choice since his device stopped working -he will see me back 6 weeks after he gets his new device

## 2021-04-16 ENCOUNTER — Telehealth: Payer: Self-pay | Admitting: Cardiology

## 2021-04-16 NOTE — Telephone Encounter (Signed)
Patient is calling stating he was left a voicemail last week in regards to a sleep study. Please advise.

## 2021-04-19 NOTE — Telephone Encounter (Signed)
Returned patients call and he states he has received his new cpap. ?

## 2021-04-25 ENCOUNTER — Ambulatory Visit (INDEPENDENT_AMBULATORY_CARE_PROVIDER_SITE_OTHER): Payer: Medicare Other

## 2021-04-25 DIAGNOSIS — I422 Other hypertrophic cardiomyopathy: Secondary | ICD-10-CM | POA: Diagnosis not present

## 2021-04-25 LAB — CUP PACEART REMOTE DEVICE CHECK
Battery Remaining Longevity: 36 mo
Battery Voltage: 2.95 V
Brady Statistic AP VP Percent: 0.09 %
Brady Statistic AP VS Percent: 16.96 %
Brady Statistic AS VP Percent: 0.07 %
Brady Statistic AS VS Percent: 82.88 %
Brady Statistic RA Percent Paced: 16.8 %
Brady Statistic RV Percent Paced: 0.17 %
Date Time Interrogation Session: 20230308012303
HighPow Impedance: 66 Ohm
Implantable Lead Implant Date: 20160217
Implantable Lead Implant Date: 20160217
Implantable Lead Location: 753859
Implantable Lead Location: 753860
Implantable Lead Model: 5076
Implantable Pulse Generator Implant Date: 20160217
Lead Channel Impedance Value: 342 Ohm
Lead Channel Impedance Value: 418 Ohm
Lead Channel Impedance Value: 456 Ohm
Lead Channel Pacing Threshold Amplitude: 0.625 V
Lead Channel Pacing Threshold Amplitude: 0.75 V
Lead Channel Pacing Threshold Pulse Width: 0.4 ms
Lead Channel Pacing Threshold Pulse Width: 0.4 ms
Lead Channel Sensing Intrinsic Amplitude: 1.625 mV
Lead Channel Sensing Intrinsic Amplitude: 1.625 mV
Lead Channel Sensing Intrinsic Amplitude: 31.625 mV
Lead Channel Sensing Intrinsic Amplitude: 31.625 mV
Lead Channel Setting Pacing Amplitude: 2 V
Lead Channel Setting Pacing Amplitude: 2.5 V
Lead Channel Setting Pacing Pulse Width: 0.4 ms
Lead Channel Setting Sensing Sensitivity: 0.3 mV

## 2021-05-08 NOTE — Progress Notes (Signed)
Remote ICD transmission.   

## 2021-07-25 ENCOUNTER — Ambulatory Visit (INDEPENDENT_AMBULATORY_CARE_PROVIDER_SITE_OTHER): Payer: Medicare Other

## 2021-07-25 DIAGNOSIS — I422 Other hypertrophic cardiomyopathy: Secondary | ICD-10-CM | POA: Diagnosis not present

## 2021-07-26 LAB — CUP PACEART REMOTE DEVICE CHECK
Battery Remaining Longevity: 33 mo
Battery Voltage: 2.94 V
Brady Statistic AP VP Percent: 0.08 %
Brady Statistic AP VS Percent: 23.36 %
Brady Statistic AS VP Percent: 0.05 %
Brady Statistic AS VS Percent: 76.52 %
Brady Statistic RA Percent Paced: 23.16 %
Brady Statistic RV Percent Paced: 0.15 %
Date Time Interrogation Session: 20230607181605
HighPow Impedance: 66 Ohm
Implantable Lead Implant Date: 20160217
Implantable Lead Implant Date: 20160217
Implantable Lead Location: 753859
Implantable Lead Location: 753860
Implantable Lead Model: 5076
Implantable Pulse Generator Implant Date: 20160217
Lead Channel Impedance Value: 342 Ohm
Lead Channel Impedance Value: 418 Ohm
Lead Channel Impedance Value: 456 Ohm
Lead Channel Pacing Threshold Amplitude: 0.75 V
Lead Channel Pacing Threshold Amplitude: 0.75 V
Lead Channel Pacing Threshold Pulse Width: 0.4 ms
Lead Channel Pacing Threshold Pulse Width: 0.4 ms
Lead Channel Sensing Intrinsic Amplitude: 1.5 mV
Lead Channel Sensing Intrinsic Amplitude: 1.5 mV
Lead Channel Sensing Intrinsic Amplitude: 31.625 mV
Lead Channel Sensing Intrinsic Amplitude: 31.625 mV
Lead Channel Setting Pacing Amplitude: 2 V
Lead Channel Setting Pacing Amplitude: 2.5 V
Lead Channel Setting Pacing Pulse Width: 0.4 ms
Lead Channel Setting Sensing Sensitivity: 0.3 mV

## 2021-08-03 NOTE — Progress Notes (Signed)
Remote ICD transmission.   

## 2021-09-14 ENCOUNTER — Telehealth: Payer: Self-pay | Admitting: Cardiology

## 2021-09-14 NOTE — Telephone Encounter (Signed)
Patient called stating he needs assistance with his device as it will not turn on and he is unable to transmit readings.

## 2021-09-18 NOTE — Telephone Encounter (Signed)
I spoke with the patient and ordered him a new monitor. He should receive it in 7-10 business days. 

## 2021-10-24 ENCOUNTER — Ambulatory Visit (INDEPENDENT_AMBULATORY_CARE_PROVIDER_SITE_OTHER): Payer: Medicare Other

## 2021-10-24 DIAGNOSIS — I422 Other hypertrophic cardiomyopathy: Secondary | ICD-10-CM

## 2021-10-30 LAB — CUP PACEART REMOTE DEVICE CHECK
Battery Remaining Longevity: 30 mo
Battery Voltage: 2.95 V
Brady Statistic AP VP Percent: 0.07 %
Brady Statistic AP VS Percent: 24.34 %
Brady Statistic AS VP Percent: 0.06 %
Brady Statistic AS VS Percent: 75.53 %
Brady Statistic RA Percent Paced: 24.02 %
Brady Statistic RV Percent Paced: 0.14 %
Date Time Interrogation Session: 20230906043823
HighPow Impedance: 66 Ohm
Implantable Lead Implant Date: 20160217
Implantable Lead Implant Date: 20160217
Implantable Lead Location: 753859
Implantable Lead Location: 753860
Implantable Lead Model: 5076
Implantable Pulse Generator Implant Date: 20160217
Lead Channel Impedance Value: 342 Ohm
Lead Channel Impedance Value: 418 Ohm
Lead Channel Impedance Value: 456 Ohm
Lead Channel Pacing Threshold Amplitude: 0.625 V
Lead Channel Pacing Threshold Amplitude: 0.75 V
Lead Channel Pacing Threshold Pulse Width: 0.4 ms
Lead Channel Pacing Threshold Pulse Width: 0.4 ms
Lead Channel Sensing Intrinsic Amplitude: 1.5 mV
Lead Channel Sensing Intrinsic Amplitude: 1.5 mV
Lead Channel Sensing Intrinsic Amplitude: 31.625 mV
Lead Channel Sensing Intrinsic Amplitude: 31.625 mV
Lead Channel Setting Pacing Amplitude: 2 V
Lead Channel Setting Pacing Amplitude: 2.5 V
Lead Channel Setting Pacing Pulse Width: 0.4 ms
Lead Channel Setting Sensing Sensitivity: 0.3 mV

## 2021-11-12 NOTE — Progress Notes (Signed)
Remote ICD transmission.   
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
## 2022-01-23 ENCOUNTER — Ambulatory Visit (INDEPENDENT_AMBULATORY_CARE_PROVIDER_SITE_OTHER): Payer: Medicare Other

## 2022-01-23 DIAGNOSIS — I422 Other hypertrophic cardiomyopathy: Secondary | ICD-10-CM | POA: Diagnosis not present

## 2022-01-23 LAB — CUP PACEART REMOTE DEVICE CHECK
Battery Remaining Longevity: 29 mo
Battery Voltage: 2.93 V
Brady Statistic AP VP Percent: 0.07 %
Brady Statistic AP VS Percent: 23.4 %
Brady Statistic AS VP Percent: 0.06 %
Brady Statistic AS VS Percent: 76.47 %
Brady Statistic RA Percent Paced: 23.11 %
Brady Statistic RV Percent Paced: 0.15 %
Date Time Interrogation Session: 20231206022823
HighPow Impedance: 65 Ohm
Implantable Lead Connection Status: 753985
Implantable Lead Connection Status: 753985
Implantable Lead Implant Date: 20160217
Implantable Lead Implant Date: 20160217
Implantable Lead Location: 753859
Implantable Lead Location: 753860
Implantable Lead Model: 5076
Implantable Pulse Generator Implant Date: 20160217
Lead Channel Impedance Value: 304 Ohm
Lead Channel Impedance Value: 399 Ohm
Lead Channel Impedance Value: 456 Ohm
Lead Channel Pacing Threshold Amplitude: 0.75 V
Lead Channel Pacing Threshold Amplitude: 0.75 V
Lead Channel Pacing Threshold Pulse Width: 0.4 ms
Lead Channel Pacing Threshold Pulse Width: 0.4 ms
Lead Channel Sensing Intrinsic Amplitude: 1.875 mV
Lead Channel Sensing Intrinsic Amplitude: 1.875 mV
Lead Channel Sensing Intrinsic Amplitude: 31.625 mV
Lead Channel Sensing Intrinsic Amplitude: 31.625 mV
Lead Channel Setting Pacing Amplitude: 2 V
Lead Channel Setting Pacing Amplitude: 2.5 V
Lead Channel Setting Pacing Pulse Width: 0.4 ms
Lead Channel Setting Sensing Sensitivity: 0.3 mV
Zone Setting Status: 755011

## 2022-01-30 ENCOUNTER — Ambulatory Visit: Payer: Medicare Other | Attending: Internal Medicine | Admitting: Internal Medicine

## 2022-01-30 ENCOUNTER — Encounter: Payer: Self-pay | Admitting: Internal Medicine

## 2022-01-30 VITALS — BP 138/72 | HR 71 | Ht 66.0 in | Wt 249.0 lb

## 2022-01-30 DIAGNOSIS — Z95810 Presence of automatic (implantable) cardiac defibrillator: Secondary | ICD-10-CM

## 2022-01-30 DIAGNOSIS — I4729 Other ventricular tachycardia: Secondary | ICD-10-CM | POA: Diagnosis not present

## 2022-01-30 DIAGNOSIS — I4891 Unspecified atrial fibrillation: Secondary | ICD-10-CM

## 2022-01-30 DIAGNOSIS — I5032 Chronic diastolic (congestive) heart failure: Secondary | ICD-10-CM | POA: Diagnosis not present

## 2022-01-30 DIAGNOSIS — I422 Other hypertrophic cardiomyopathy: Secondary | ICD-10-CM | POA: Diagnosis not present

## 2022-01-30 NOTE — Progress Notes (Signed)
Patient Care Team: Letta Median, MD as PCP - General (Family Medicine) Deboraha Sprang, MD as PCP - Cardiology (Cardiology) Sueanne Margarita, MD as PCP - Sleep Medicine (Cardiology)   HPI  Marc Bradford is a 61 y.o. male Seen in follow-up for apical hypertrophic cardiomyopathy. MRI demonstrates significant gadolinium enhancement. He had nonsustained ventricular tachycardia and underwent primary prevention ICD Medtronic     He has a history of a prior stroke with residual weakness as well as hypertension    The patient denies chest pain, nocturnal dyspnea,  or peripheral edema.  There have been no palpitations, lightheadedness or syncope.  Complains of chronic stable dyspnea.  He has treated sleep apnea.  Sleeps on 2 pillows.  Date Cr K  10/17 0.8 3.4  7/19  0.84 4.2  8/21 0.87 3.6      Past Medical History:  Diagnosis Date   Gout    Hyperlipidemia    Hypertension 09/30/2013   Hypertrophic cardiomyopathy ([place]) 09/30/2013   a. s/p MDT dual chamber ICD implant 03/2014 followed by Dr Caryl Comes   OSA (obstructive sleep apnea) 10/28/2014   Severe with AHI 78/hr now on CPAP   Stroke ([place]) 09/22/2013   a. non-hemorrhagic b. residual left sided weakness    Past Surgical History:  Procedure Laterality Date   IMPLANTABLE CARDIOVERTER DEFIBRILLATOR IMPLANT N/A 04/06/2014   MDT MRI compatible dual chamber ICD implanted by Dr Caryl Comes    Current Outpatient Medications  Medication Sig Dispense Refill   allopurinol (ZYLOPRIM) 300 MG tablet Take 300 mg by mouth daily.     amLODipine (NORVASC) 2.5 MG tablet Take 2.5 mg by mouth daily.     aspirin EC 81 MG tablet Take 81 mg by mouth daily.     atorvastatin (LIPITOR) 80 MG tablet Take 80 mg by mouth at bedtime.     Cholecalciferol (VITAMIN D PO) Take 1 tablet by mouth daily.     gabapentin (NEURONTIN) 300 MG capsule Take 1 capsule by mouth 2 (two) times daily as needed.     lidocaine (LIDODERM) 5 % Place 1 patch onto the skin daily.  Remove & Discard patch within 12 hours or as directed by MD 30 patch 0   lisinopril (ZESTRIL) 40 MG tablet Take 40 mg by mouth daily.     metFORMIN (GLUCOPHAGE) 500 MG tablet Take 500 mg by mouth 2 (two) times daily.     naproxen (NAPROSYN) 500 MG tablet Take 1 tablet (500 mg total) by mouth 2 (two) times daily with a meal. (Patient taking differently: Take 500 mg by mouth as needed.) 20 tablet 00   sildenafil (VIAGRA) 50 MG tablet Take 25 mg by mouth as needed for erectile dysfunction.     No current facility-administered medications for this visit.    No Known Allergies    Review of Systems negative except from HPI and PMH  Physical Exam BP 138/72   Pulse 71   Ht 5\' 6"  (1.676 m)   Wt 249 lb (112.9 kg)   SpO2 93%   BMI 40.19 kg/m  Well developed and Morbidly obese n no acute distress HENT normal Neck supple with JVP-flat Clear Device pocket well healed; without hematoma or erythema.  There is no tethering  Regular rate and rhythm, no  gallop No murmur Abd-soft with active BS No Clubbing cyanosis  edema Skin-warm and dry A & Oriented  Grossly normal sensory and motor function  ECG sinus at 82 Intervals 28/10/42 T  wave inversions to 38F V3-V6 Unchanged 5/22  Device function is normal. Programming changes   See Paceart for details    Assessment and  Plan  HCM-atypical variant  VT nonsustained  ICD Medtronic    HFpEF  Morbidly obese\  Hypertension  PVC>> 2% per counter   Functional status stable, have encouraged him on weight loss.  No interval tachy therapies.  Blood pressure reasonably controlled.  Will continue amlodipine and lisinopril.

## 2022-01-30 NOTE — Patient Instructions (Signed)
Medication Instructions:  Your physician recommends that you continue on your current medications as directed. Please refer to the Current Medication list given to you today.  *If you need a refill on your cardiac medications before your next appointment, please call your pharmacy*   Lab Work: None ordered.  If you have labs (blood work) drawn today and your tests are completely normal, you will receive your results only by: MyChart Message (if you have MyChart) OR A paper copy in the mail If you have any lab test that is abnormal or we need to change your treatment, we will call you to review the results.   Testing/Procedures: None ordered.    Follow-Up: At [facility], you and your health needs are our priority.  As part of our continuing mission to provide you with exceptional heart care, we have created designated Provider Care Teams.  These Care Teams include your primary Cardiologist (physician) and Advanced Practice Providers (APPs -  Physician Assistants and Nurse Practitioners) who all work together to provide you with the care you need, when you need it.  We recommend signing up for the patient portal called "MyChart".  Sign up information is provided on this After Visit Summary.  MyChart is used to connect with patients for Virtual Visits (Telemedicine).  Patients are able to view lab/test results, encounter notes, upcoming appointments, etc.  Non-urgent messages can be sent to your provider as well.   To learn more about what you can do with MyChart, go to [email or web address].    Your next appointment:   12 months with Dr Klein  Important Information About Sugar       

## 2022-01-31 LAB — CUP PACEART INCLINIC DEVICE CHECK
Battery Remaining Longevity: 28 mo
Battery Voltage: 2.93 V
Brady Statistic AP VP Percent: 0.11 %
Brady Statistic AP VS Percent: 22.88 %
Brady Statistic AS VP Percent: 0.06 %
Brady Statistic AS VS Percent: 76.95 %
Brady Statistic RA Percent Paced: 22.68 %
Brady Statistic RV Percent Paced: 0.19 %
Date Time Interrogation Session: 20231213155600
HighPow Impedance: 68 Ohm
Implantable Lead Connection Status: 753985
Implantable Lead Connection Status: 753985
Implantable Lead Implant Date: 20160217
Implantable Lead Implant Date: 20160217
Implantable Lead Location: 753859
Implantable Lead Location: 753860
Implantable Lead Model: 5076
Implantable Pulse Generator Implant Date: 20160217
Lead Channel Impedance Value: 342 Ohm
Lead Channel Impedance Value: 456 Ohm
Lead Channel Impedance Value: 475 Ohm
Lead Channel Pacing Threshold Amplitude: 0.75 V
Lead Channel Pacing Threshold Amplitude: 0.75 V
Lead Channel Pacing Threshold Pulse Width: 0.4 ms
Lead Channel Pacing Threshold Pulse Width: 0.4 ms
Lead Channel Sensing Intrinsic Amplitude: 1.75 mV
Lead Channel Sensing Intrinsic Amplitude: 1.75 mV
Lead Channel Sensing Intrinsic Amplitude: 31.625 mV
Lead Channel Sensing Intrinsic Amplitude: 31.625 mV
Lead Channel Setting Pacing Amplitude: 2 V
Lead Channel Setting Pacing Amplitude: 2.5 V
Lead Channel Setting Pacing Pulse Width: 0.4 ms
Lead Channel Setting Sensing Sensitivity: 0.3 mV
Zone Setting Status: 755011

## 2022-02-15 NOTE — Progress Notes (Signed)
Remote ICD transmission.   

## 2022-04-24 ENCOUNTER — Ambulatory Visit: Payer: 59

## 2022-04-24 DIAGNOSIS — I422 Other hypertrophic cardiomyopathy: Secondary | ICD-10-CM

## 2022-04-24 LAB — CUP PACEART REMOTE DEVICE CHECK
Battery Remaining Longevity: 25 mo
Battery Voltage: 2.93 V
Brady Statistic AP VP Percent: 0.03 %
Brady Statistic AP VS Percent: 22.11 %
Brady Statistic AS VP Percent: 0.04 %
Brady Statistic AS VS Percent: 77.82 %
Brady Statistic RA Percent Paced: 21.69 %
Brady Statistic RV Percent Paced: 0.08 %
Date Time Interrogation Session: 20240306012502
HighPow Impedance: 70 Ohm
Implantable Lead Connection Status: 753985
Implantable Lead Connection Status: 753985
Implantable Lead Implant Date: 20160217
Implantable Lead Implant Date: 20160217
Implantable Lead Location: 753859
Implantable Lead Location: 753860
Implantable Lead Model: 5076
Implantable Pulse Generator Implant Date: 20160217
Lead Channel Impedance Value: 342 Ohm
Lead Channel Impedance Value: 418 Ohm
Lead Channel Impedance Value: 456 Ohm
Lead Channel Pacing Threshold Amplitude: 0.75 V
Lead Channel Pacing Threshold Amplitude: 0.75 V
Lead Channel Pacing Threshold Pulse Width: 0.4 ms
Lead Channel Pacing Threshold Pulse Width: 0.4 ms
Lead Channel Sensing Intrinsic Amplitude: 1.25 mV
Lead Channel Sensing Intrinsic Amplitude: 1.25 mV
Lead Channel Sensing Intrinsic Amplitude: 31.625 mV
Lead Channel Sensing Intrinsic Amplitude: 31.625 mV
Lead Channel Setting Pacing Amplitude: 2 V
Lead Channel Setting Pacing Amplitude: 2.5 V
Lead Channel Setting Pacing Pulse Width: 0.4 ms
Lead Channel Setting Sensing Sensitivity: 0.3 mV
Zone Setting Status: 755011

## 2022-05-29 NOTE — Progress Notes (Signed)
Remote ICD transmission.   

## 2022-06-11 ENCOUNTER — Encounter: Payer: Self-pay | Admitting: Internal Medicine

## 2022-06-11 ENCOUNTER — Telehealth: Payer: Self-pay

## 2022-06-11 NOTE — Telephone Encounter (Signed)
Message left for patient's daughter to return my call.  

## 2022-06-11 NOTE — Telephone Encounter (Signed)
Pt daughter  lynn called to sched pts colonoscopy please return call [phone]

## 2022-06-12 NOTE — Telephone Encounter (Signed)
Message left for patient to return my call.  

## 2022-06-18 ENCOUNTER — Telehealth: Payer: Self-pay

## 2022-06-18 NOTE — Telephone Encounter (Signed)
Pt left message to schedule colonoscopy please return call  

## 2022-06-18 NOTE — Telephone Encounter (Signed)
Message left for patient to return my call.  

## 2022-06-20 ENCOUNTER — Other Ambulatory Visit: Payer: Self-pay | Admitting: *Deleted

## 2022-06-20 ENCOUNTER — Telehealth: Payer: Self-pay | Admitting: *Deleted

## 2022-06-20 DIAGNOSIS — Z1211 Encounter for screening for malignant neoplasm of colon: Secondary | ICD-10-CM

## 2022-06-20 MED ORDER — PEG 3350-KCL-NABCB-NACL-NASULF 236 G PO SOLR: 4000.0000 mL | Freq: Once | ORAL | 0 refills | Status: AC

## 2022-06-20 NOTE — Telephone Encounter (Signed)
   Pre-operative Risk Assessment    Patient Name: Marc Bradford  DOB: Mar 14, 1960 MRN: [id number]      Request for Surgical Clearance    Procedure:   COLONOSCOPY  Date of Surgery:  Clearance 07/09/22                                 Surgeon:  Toney Reil Surgeon's Group or Practice Name:  [facility] Phone number:  [phone] Fax number:  [phone]   Type of Clearance Requested:   - Medical ; NO MEDICATIONS LISTED AS NEEDING TO BE HELD   Type of Anesthesia:  General    Additional requests/questions:    Elpidio Anis   06/20/2022, 1:02 PM

## 2022-06-20 NOTE — Telephone Encounter (Signed)
-----   Message from Ronney Asters, NP sent at 06/20/2022 12:32 PM EDT ----- Regarding: RE: Pre Op Preoperative team,  please add request to preoperative pool.  Thank you for your help.  Thomasene Ripple. Cleaver NP-C    06/20/2022, 12:32 PM [facility] [address] Office [phone] Fax [phone]  ----- Message ----- From: Tawnya Crook, CMA Sent: 06/20/2022  12:14 PM EDT To: [facility] Preop Subject: Pre Op                                         06/20/2022  Patient Name: Marc Bradford  DOB: May 26, 1960  To whom it may concern:  [name]  have to be scheduled for a colonoscopy on 07/09/2022, under GENERAL anesthesia.  Please FAX a note of Medical Clearance to [phone], ATTENTION: Johny Drilling  To advise if this patient will require an office visit or further medical work-up before clearance can be given please call [phone] (main number) and leave a message with the triage nurse.  Thank you    [facility]   _____ Patient is cleared to have procedure  _____ Patient is cleared to have procedure and should stop taking   __________________________ Medication _____ days prior   and _____ restart.  Additional notes:_________________________________________________________  ______________________________________________________________________   _______________________________________ ________________ Physician Signature     Date

## 2022-06-20 NOTE — Telephone Encounter (Signed)
  Pt scheduled for tele pre op appt 07/04/22 @ 10:20. Med rec and consent are done.

## 2022-06-20 NOTE — Telephone Encounter (Signed)
Colonoscopy have been schedule on 07/09/2022 with Dr Allegra Lai

## 2022-06-20 NOTE — Telephone Encounter (Signed)
Message left for patient to return my call.  

## 2022-06-20 NOTE — Telephone Encounter (Signed)
Gastroenterology Pre-Procedure Review  Request Date: 07/09/2022 Requesting Physician: Dr. Allegra Lai  PATIENT REVIEW QUESTIONS: The patient responded to the following health history questions as indicated:    1. Are you having any GI issues? no 2. Do you have a personal history of Polyps? no 3. Do you have a family history of Colon Cancer or Polyps? no 4. Diabetes Mellitus? yes (taking metfomin) 5. Joint replacements in the past 12 months?no 6. Major health problems in the past 3 months?no 7. Any artificial heart valves, MVP, or defibrillator?yes (afib)    MEDICATIONS & ALLERGIES:    Patient reports the following regarding taking any anticoagulation/antiplatelet therapy:   Plavix, Coumadin, Eliquis, Xarelto, Lovenox, Pradaxa, Brilinta, or Effient? no Aspirin? yes (81 mg)  Patient confirms/reports the following medications:  Current Outpatient Medications  Medication Sig Dispense Refill   allopurinol (ZYLOPRIM) 300 MG tablet Take 300 mg by mouth daily.     amLODipine (NORVASC) 2.5 MG tablet Take 2.5 mg by mouth daily.     aspirin EC 81 MG tablet Take 81 mg by mouth daily.     atorvastatin (LIPITOR) 80 MG tablet Take 80 mg by mouth at bedtime.     Cholecalciferol (VITAMIN D PO) Take 1 tablet by mouth daily.     gabapentin (NEURONTIN) 300 MG capsule Take 1 capsule by mouth 2 (two) times daily as needed.     lidocaine (LIDODERM) 5 % Place 1 patch onto the skin daily. Remove & Discard patch within 12 hours or as directed by MD 30 patch 0   lisinopril (ZESTRIL) 40 MG tablet Take 40 mg by mouth daily.     metFORMIN (GLUCOPHAGE) 500 MG tablet Take 500 mg by mouth 2 (two) times daily.     naproxen (NAPROSYN) 500 MG tablet Take 1 tablet (500 mg total) by mouth 2 (two) times daily with a meal. (Patient taking differently: Take 500 mg by mouth as needed.) 20 tablet 00   sildenafil (VIAGRA) 50 MG tablet Take 25 mg by mouth as needed for erectile dysfunction.     No current facility-administered  medications for this visit.    Patient confirms/reports the following allergies:  No Known Allergies  No orders of the defined types were placed in this encounter.   AUTHORIZATION INFORMATION Primary Insurance: 1D#: Group #:  Secondary Insurance: 1D#: Group #:  SCHEDULE INFORMATION: Date:  Time: Location:

## 2022-06-20 NOTE — Telephone Encounter (Signed)
   Name: Marc Bradford  DOB: 12/02/1960  MRN: [id number]  Primary Cardiologist: Sherryl Manges, MD   Preoperative team, please contact this patient and set up a phone call appointment for further preoperative risk assessment. Please obtain consent and complete medication review. Thank you for your help.  I confirm that guidance regarding antiplatelet and oral anticoagulation therapy has been completed and, if necessary, noted below.  None requested.   Ronney Asters, NP 06/20/2022, 1:23 PM [facility]

## 2022-06-20 NOTE — Telephone Encounter (Signed)
Pt scheduled for tele pre op appt 07/04/22 @ 10:20. Med rec and consent are done.     Patient Consent for Virtual Visit        Marc Bradford has provided verbal consent on 06/20/2022 for a virtual visit (video or telephone).   CONSENT FOR VIRTUAL VISIT FOR:  Marc Bradford  By participating in this virtual visit I agree to the following:  I hereby voluntarily request, consent and authorize [facility] and its employed or contracted physicians, physician assistants, nurse practitioners or other licensed health care professionals (the Practitioner), to provide me with telemedicine health care services (the "Services") as deemed necessary by the treating Practitioner. I acknowledge and consent to receive the Services by the Practitioner via telemedicine. I understand that the telemedicine visit will involve communicating with the Practitioner through live audiovisual communication technology and the disclosure of certain medical information by electronic transmission. I acknowledge that I have been given the opportunity to request an in-person assessment or other available alternative prior to the telemedicine visit and am voluntarily participating in the telemedicine visit.  I understand that I have the right to withhold or withdraw my consent to the use of telemedicine in the course of my care at any time, without affecting my right to future care or treatment, and that the Practitioner or I may terminate the telemedicine visit at any time. I understand that I have the right to inspect all information obtained and/or recorded in the course of the telemedicine visit and may receive copies of available information for a reasonable fee.  I understand that some of the potential risks of receiving the Services via telemedicine include:  Delay or interruption in medical evaluation due to technological equipment failure or disruption; Information transmitted may not be sufficient (e.g. poor resolution of  images) to allow for appropriate medical decision making by the Practitioner; and/or  In rare instances, security protocols could fail, causing a breach of personal health information.  Furthermore, I acknowledge that it is my responsibility to provide information about my medical history, conditions and care that is complete and accurate to the best of my ability. I acknowledge that Practitioner's advice, recommendations, and/or decision may be based on factors not within their control, such as incomplete or inaccurate data provided by me or distortions of diagnostic images or specimens that may result from electronic transmissions. I understand that the practice of medicine is not an exact science and that Practitioner makes no warranties or guarantees regarding treatment outcomes. I acknowledge that a copy of this consent can be made available to me via my patient portal [facility] MyChart), or I can request a printed copy by calling the office of [facility].    I understand that my insurance will be billed for this visit.   I have read or had this consent read to me. I understand the contents of this consent, which adequately explains the benefits and risks of the Services being provided via telemedicine.  I have been provided ample opportunity to ask questions regarding this consent and the Services and have had my questions answered to my satisfaction. I give my informed consent for the services to be provided through the use of telemedicine in my medical care

## 2022-07-01 ENCOUNTER — Ambulatory Visit: Payer: 59 | Attending: Internal Medicine

## 2022-07-01 DIAGNOSIS — Z01810 Encounter for preprocedural cardiovascular examination: Secondary | ICD-10-CM | POA: Diagnosis not present

## 2022-07-01 NOTE — Progress Notes (Signed)
Virtual Visit via Telephone Note   Because of Marc Bradford's co-morbid illnesses, he is at least at moderate risk for complications without adequate follow up.  This format is felt to be most appropriate for this patient at this time.  The patient did not have access to video technology/had technical difficulties with video requiring transitioning to audio format only (telephone).  All issues noted in this document were discussed and addressed.  No physical exam could be performed with this format.  Please refer to the patient's chart for his consent to telehealth for [facility].  Evaluation Performed:  Preoperative cardiovascular risk assessment _____________   Date:  07/01/2022   Patient ID:  Marc Bradford, DOB 04-23-1960, MRN [id number] Patient Location:  Home Provider location:   Office  Primary Care Provider:  Oswaldo Conroy, MD Primary Cardiologist:  Sherryl Manges, MD  Chief Complaint / Patient Profile   62 y.o. y/o male with a h/o apical hypertrophic cardiomyopathy, hypertension, hyperlipidemia, OSA, history of stroke who is pending colonoscopy and presents today for telephonic preoperative cardiovascular risk assessment.  History of Present Illness    Marc Bradford is a 62 y.o. male who presents via audio/video conferencing for a telehealth visit today.  Pt was last seen in cardiology clinic on 01/30/2022 by Dr. Graciela Husbands.  At that time Armeen Capulong was doing well.  The patient is now pending procedure as outlined above. Since his last visit, he tells me that he has not had any chest pain or shortness of breath.  However, he did endorse a pain on Saturday morning that briefly occurred in his chest.  Lasted for few minutes and he thinks he may have gotten out of the bed wrong.  Has not reoccurred.  No palpitations.  Every evening he does YouTube exercises.  States that he does a 1 mile walk for beginners.  He does exceed the 4 METS minimum requirement.  No medications  indicated as needing held.  Advised to continue aspirin.  Past Medical History    Past Medical History:  Diagnosis Date   Gout    Hyperlipidemia    Hypertension 09/30/2013   Hypertrophic cardiomyopathy (HCC) 09/30/2013   a. s/p MDT dual chamber ICD implant 03/2014 followed by Dr Graciela Husbands   OSA (obstructive sleep apnea) 10/28/2014   Severe with AHI 78/hr now on CPAP   Stroke (HCC) 09/22/2013   a. non-hemorrhagic b. residual left sided weakness   Past Surgical History:  Procedure Laterality Date   IMPLANTABLE CARDIOVERTER DEFIBRILLATOR IMPLANT N/A 04/06/2014   MDT MRI compatible dual chamber ICD implanted by Dr Graciela Husbands    Allergies  No Known Allergies  Home Medications    Prior to Admission medications   Medication Sig Start Date End Date Taking? Authorizing Provider  allopurinol (ZYLOPRIM) 300 MG tablet Take 300 mg by mouth daily. 02/17/19   [provider]  amLODipine (NORVASC) 2.5 MG tablet Take 2.5 mg by mouth daily. 01/25/19   [provider]  aspirin EC 81 MG tablet Take 81 mg by mouth daily.    [provider]  atorvastatin (LIPITOR) 80 MG tablet Take 80 mg by mouth at bedtime. Patient not taking: Reported on 06/20/2022 12/07/18   [provider]  Cholecalciferol (VITAMIN D PO) Take 1 tablet by mouth daily.    [provider]  gabapentin (NEURONTIN) 300 MG capsule Take 1 capsule by mouth 2 (two) times daily as needed. Patient not taking: Reported on 06/20/2022 06/27/20   [provider]  lidocaine (LIDODERM) 5 % Place 1 patch onto the skin daily. Remove & Discard patch within 12 hours or as directed by MD Patient not taking: Reported on 06/20/2022 06/24/20   Army Melia A, PA-C  lisinopril (ZESTRIL) 40 MG tablet Take 40 mg by mouth daily.    [provider]  metFORMIN (GLUCOPHAGE) 500 MG tablet Take 500 mg by mouth 2 (two) times daily. 02/15/21   [provider]  naproxen (NAPROSYN) 500 MG tablet Take 1 tablet (500 mg  total) by mouth 2 (two) times daily with a meal. Patient taking differently: Take 500 mg by mouth as needed. 06/17/20   Joni Reining, PA-C  sildenafil (VIAGRA) 50 MG tablet Take 25 mg by mouth as needed for erectile dysfunction. 11/29/21   [provider]    Physical Exam    Vital Signs:  Janie Missel does not have vital signs available for review today.  Given telephonic nature of communication, physical exam is limited. AAOx3. NAD. Normal affect.  Speech and respirations are unlabored.  Accessory Clinical Findings    None  Assessment & Plan    1.  Preoperative Cardiovascular Risk Assessment:  Mr. Dupree perioperative risk of a major cardiac event is 6.6% according to the Revised Cardiac Risk Index (RCRI).  Therefore, he is at high risk for perioperative complications.   His functional capacity is good at 5.07 METs according to the Duke Activity Status Index (DASI). Recommendations: According to ACC/AHA guidelines, no further cardiovascular testing needed.  The patient may proceed to surgery at acceptable risk.    The patient was advised that if he develops new symptoms prior to surgery to contact our office to arrange for a follow-up visit, and he verbalized understanding.   A copy of this note will be routed to requesting surgeon.  Time:   Today, I have spent 10 minutes with the patient with telehealth technology discussing medical history, symptoms, and management plan.     Sharlene Dory, PA-C  07/01/2022, 10:32 AM

## 2022-07-02 ENCOUNTER — Telehealth: Payer: Self-pay | Admitting: *Deleted

## 2022-07-02 NOTE — Telephone Encounter (Signed)
Message left for patient to return my call.  

## 2022-07-02 NOTE — Telephone Encounter (Signed)
Assessment & Plan    1.  Preoperative Cardiovascular Risk Assessment:   Mr. Glunz perioperative risk of a major cardiac event is 6.6% according to the Revised Cardiac Risk Index (RCRI).  Therefore, he is at high risk for perioperative complications.   His functional capacity is good at 5.07 METs according to the Duke Activity Status Index (DASI). Recommendations: According to ACC/AHA guidelines, no further cardiovascular testing needed.  The patient may proceed to surgery at acceptable risk.     The patient was advised that if he develops new symptoms prior to surgery to contact our office to arrange for a follow-up visit, and he verbalized understanding.     A copy of this note will be routed to requesting surgeon.   Time:   Today, I have spent 10 minutes with the patient with telehealth technology discussing medical history, symptoms, and management plan.       Sharlene Dory, PA-C   07/01/2022, 10:32 AM

## 2022-07-03 NOTE — Telephone Encounter (Signed)
Spoken to patient and patient verbalized understanding.

## 2022-07-08 ENCOUNTER — Encounter: Payer: Self-pay | Admitting: Gastroenterology

## 2022-07-08 ENCOUNTER — Telehealth: Payer: Self-pay

## 2022-07-08 NOTE — Telephone Encounter (Signed)
Patient's wife Larita Fife called office to make sure it is okay to mix prep with clear gatorade because the rx instructions said to mix with water. Larita Fife has been advised that yes she can mix his prep with clear gatorade as our paper instructions noted.  She has also been advised that her husband should take his Amlodipine and Lisinopril with just a sip of water.  Larita Fife verbalized understanding.  Thanks, [place], [place]

## 2022-07-09 ENCOUNTER — Encounter
Admission: RE | Disposition: A | Discharge status: Home / Self Care | Payer: Self-pay | Source: Home / Self Care | Attending: Gastroenterology

## 2022-07-09 ENCOUNTER — Ambulatory Visit: Payer: 59 | Admitting: Anesthesiology

## 2022-07-09 ENCOUNTER — Other Ambulatory Visit: Payer: Self-pay

## 2022-07-09 ENCOUNTER — Ambulatory Visit
Admission: RE | Admit: 2022-07-09 | Discharge: 2022-07-09 | Disposition: A | Discharge status: Home / Self Care | Payer: 59 | Attending: Gastroenterology | Admitting: Gastroenterology

## 2022-07-09 ENCOUNTER — Encounter: Payer: Self-pay | Admitting: Gastroenterology

## 2022-07-09 DIAGNOSIS — D124 Benign neoplasm of descending colon: Secondary | ICD-10-CM | POA: Diagnosis not present

## 2022-07-09 DIAGNOSIS — I69354 Hemiplegia and hemiparesis following cerebral infarction affecting left non-dominant side: Secondary | ICD-10-CM | POA: Insufficient documentation

## 2022-07-09 DIAGNOSIS — K635 Polyp of colon: Secondary | ICD-10-CM | POA: Diagnosis not present

## 2022-07-09 DIAGNOSIS — I422 Other hypertrophic cardiomyopathy: Secondary | ICD-10-CM | POA: Insufficient documentation

## 2022-07-09 DIAGNOSIS — Z1211 Encounter for screening for malignant neoplasm of colon: Secondary | ICD-10-CM | POA: Diagnosis present

## 2022-07-09 DIAGNOSIS — I10 Essential (primary) hypertension: Secondary | ICD-10-CM | POA: Diagnosis not present

## 2022-07-09 DIAGNOSIS — M109 Gout, unspecified: Secondary | ICD-10-CM | POA: Insufficient documentation

## 2022-07-09 DIAGNOSIS — E785 Hyperlipidemia, unspecified: Secondary | ICD-10-CM | POA: Diagnosis not present

## 2022-07-09 DIAGNOSIS — Z95810 Presence of automatic (implantable) cardiac defibrillator: Secondary | ICD-10-CM | POA: Insufficient documentation

## 2022-07-09 DIAGNOSIS — G4733 Obstructive sleep apnea (adult) (pediatric): Secondary | ICD-10-CM | POA: Insufficient documentation

## 2022-07-09 HISTORY — PX: COLONOSCOPY WITH PROPOFOL: SHX5780

## 2022-07-09 HISTORY — DX: Atherosclerotic heart disease of native coronary artery without angina pectoris: I25.10

## 2022-07-09 HISTORY — DX: Type 2 diabetes mellitus without complications: E11.9

## 2022-07-09 LAB — GLUCOSE, CAPILLARY: Glucose-Capillary: 148 mg/dL — ABNORMAL HIGH (ref 70–99)

## 2022-07-09 SURGERY — COLONOSCOPY WITH PROPOFOL
Anesthesia: General

## 2022-07-09 MED ORDER — PROPOFOL 10 MG/ML IV BOLUS
INTRAVENOUS | Status: AC
  Filled 2022-07-09: qty 20

## 2022-07-09 MED ORDER — PROPOFOL 10 MG/ML IV BOLUS
INTRAVENOUS | Status: DC | PRN
  Administered 2022-07-09: 30 mg via INTRAVENOUS
  Administered 2022-07-09: 20 mg via INTRAVENOUS
  Administered 2022-07-09: 50 mg via INTRAVENOUS

## 2022-07-09 MED ORDER — PROPOFOL 500 MG/50ML IV EMUL
INTRAVENOUS | Status: DC | PRN
  Administered 2022-07-09: 100 ug/kg/min via INTRAVENOUS

## 2022-07-09 MED ORDER — LIDOCAINE HCL (CARDIAC) PF 100 MG/5ML IV SOSY
PREFILLED_SYRINGE | INTRAVENOUS | Status: DC | PRN
  Administered 2022-07-09: 40 mg via INTRAVENOUS

## 2022-07-09 MED ORDER — SODIUM CHLORIDE 0.9 % IV SOLN: INTRAVENOUS | Status: DC

## 2022-07-09 MED ORDER — DEXMEDETOMIDINE HCL IN NACL 80 MCG/20ML IV SOLN
INTRAVENOUS | Status: AC
  Filled 2022-07-09: qty 20

## 2022-07-09 MED ORDER — STERILE WATER FOR IRRIGATION IR SOLN
Status: DC | PRN
  Administered 2022-07-09: 100 mL

## 2022-07-09 NOTE — Op Note (Signed)
[facility] Patient Name: Marc Bradford Procedure Date: 07/09/2022 9:44 AM MRN: [id number] Account #: [id number] Date of Birth: 04-20-1960 Admit Type: Outpatient Age: 62 Room: [facility] ENDO ROOM 3 Gender: Male Note Status: Finalized Instrument Name: Prentice Docker [id number] Procedure:             Colonoscopy Indications:           Screening for colorectal malignant neoplasm, This is                         the patient's first colonoscopy Providers:             Toney Reil MD, MD Medicines:             General Anesthesia Complications:         No immediate complications. Estimated blood loss: None. Procedure:             Pre-Anesthesia Assessment:                        - Prior to the procedure, a History and Physical was                         performed, and patient medications and allergies were                         reviewed. The patient is competent. The risks and                         benefits of the procedure and the sedation options and                         risks were discussed with the patient. All questions                         were answered and informed consent was obtained.                         Patient identification and proposed procedure were                         verified by the physician, the nurse, the                         anesthesiologist, the anesthetist and the technician                         in the pre-procedure area in the procedure room in the                         endoscopy suite. Mental Status Examination: alert and                         oriented. Airway Examination: normal oropharyngeal                         airway and neck mobility. Respiratory Examination:                         clear to auscultation.  CV Examination: normal.                         Prophylactic Antibiotics: The patient does not require                         prophylactic antibiotics. Prior Anticoagulants: The                          patient has taken no anticoagulant or antiplatelet                         agents. ASA Grade Assessment: III - A patient with                         severe systemic disease. After reviewing the risks and                         benefits, the patient was deemed in satisfactory                         condition to undergo the procedure. The anesthesia                         plan was to use general anesthesia. Immediately prior                         to administration of medications, the patient was                         re-assessed for adequacy to receive sedatives. The                         heart rate, respiratory rate, oxygen saturations,                         blood pressure, adequacy of pulmonary ventilation, and                         response to care were monitored throughout the                         procedure. The physical status of the patient was                         re-assessed after the procedure.                        After obtaining informed consent, the colonoscope was                         passed under direct vision. Throughout the procedure,                         the patient's blood pressure, pulse, and oxygen                         saturations were monitored continuously. The  Colonoscope was introduced through the anus and                         advanced to the the terminal ileum, with                         identification of the appendiceal orifice and IC                         valve. The colonoscopy was performed without                         difficulty. The patient tolerated the procedure well.                         The quality of the bowel preparation was evaluated                         using the BBPS [facility] Bowel Preparation Scale) with                         scores of: Right Colon = 3, Transverse Colon = 3 and                         Left Colon = 3 (entire mucosa seen well with no                         residual  staining, small fragments of stool or opaque                         liquid). The total BBPS score equals 9. The terminal                         ileum, ileocecal valve, appendiceal orifice, and                         rectum were photographed. Findings:      The perianal and digital rectal examinations were normal. Pertinent       negatives include normal sphincter tone and no palpable rectal lesions.      The terminal ileum appeared normal.      A diminutive polyp was found in the descending colon. The polyp was       sessile. The polyp was removed with a jumbo cold forceps. Resection and       retrieval were complete.      The retroflexed view of the distal rectum and anal verge was normal and       showed no anal or rectal abnormalities. Impression:            - The examined portion of the ileum was normal.                        - One diminutive polyp in the descending colon,                         removed with a jumbo cold forceps. Resected and  retrieved.                        - The distal rectum and anal verge are normal on                         retroflexion view. Recommendation:        - Discharge patient to home (with escort).                        - Resume previous diet today.                        - Continue present medications.                        - Await pathology results.                        - Repeat colonoscopy in 7-10 years for surveillance                         based on pathology results. Procedure Code(s):     --- Professional ---                        [phone], Colonoscopy, flexible; with biopsy, single or                         multiple Diagnosis Code(s):     --- Professional ---                        Z12.11, Encounter for screening for malignant neoplasm                         of colon                        D12.4, Benign neoplasm of descending colon CPT copyright 2022 American Medical Association. All rights reserved. The  codes documented in this report are preliminary and upon coder review may  be revised to meet current compliance requirements. Dr. Libby Maw Toney Reil MD, MD 07/09/2022 10:23:20 AM This report has been signed electronically. Number of Addenda: 0 Note Initiated On: 07/09/2022 9:44 AM Scope Withdrawal Time: 0 hours 14 minutes 25 seconds  Total Procedure Duration: 0 hours 17 minutes 19 seconds  Estimated Blood Loss:  Estimated blood loss: none.      [facility]

## 2022-07-09 NOTE — Transfer of Care (Signed)
Immediate Anesthesia Transfer of Care Note  Patient: Marc Bradford  Procedure(s) Performed: COLONOSCOPY WITH PROPOFOL  Patient Location: Endoscopy Unit  Anesthesia Type:General  Level of Consciousness: drowsy and patient cooperative  Airway & Oxygen Therapy: Patient Spontanous Breathing and Patient connected to face mask oxygen  Post-op Assessment: Report given to RN and Patient moving all extremities X 4  Post vital signs: Reviewed and stable  Last Vitals:  Vitals Value Taken Time  BP 109/64 07/09/22 1025  Temp    Pulse 58 07/09/22 1026  Resp 17 07/09/22 1026  SpO2 100 % 07/09/22 1026  Vitals shown include unvalidated device data.  Last Pain:  Vitals:   07/09/22 0934  TempSrc: Temporal  PainSc: 0-No pain         Complications: No notable events documented.

## 2022-07-09 NOTE — Anesthesia Postprocedure Evaluation (Signed)
Anesthesia Post Note  Patient: Marc Bradford  Procedure(s) Performed: COLONOSCOPY WITH PROPOFOL  Patient location during evaluation: Endoscopy Anesthesia Type: General Level of consciousness: awake and alert Pain management: pain level controlled Vital Signs Assessment: post-procedure vital signs reviewed and stable Respiratory status: spontaneous breathing, nonlabored ventilation, respiratory function stable and patient connected to nasal cannula oxygen Cardiovascular status: blood pressure returned to baseline and stable Postop Assessment: no apparent nausea or vomiting Anesthetic complications: no   No notable events documented.   Last Vitals:  Vitals:   07/09/22 1025 07/09/22 1035  BP: 109/64 119/79  Pulse:    Resp:    Temp: 36.4 C     Last Pain:  Vitals:   07/09/22 1045  TempSrc:   PainSc: 0-No pain                 Cleda Mccreedy Danna Casella

## 2022-07-09 NOTE — H&P (Signed)
Arlyss Repress, MD [address]  [address]  [place], [place] [address]  Main: [phone]  Fax: [phone] Pager: [phone]  Primary Care Physician:  Oswaldo Conroy, MD Primary Gastroenterologist:  Dr. Arlyss Repress  Pre-Procedure History & Physical: HPI:  Marc Bradford is a 62 y.o. male is here for an colonoscopy.   Past Medical History:  Diagnosis Date   Coronary artery disease    Diabetes mellitus without complication (HCC)    Gout    Hyperlipidemia    Hypertension 09/30/2013   Hypertrophic cardiomyopathy (HCC) 09/30/2013   a. s/p MDT dual chamber ICD implant 03/2014 followed by Dr Graciela Husbands   OSA (obstructive sleep apnea) 10/28/2014   Severe with AHI 78/hr now on CPAP   Stroke (HCC) 09/22/2013   a. non-hemorrhagic b. residual left sided weakness    Past Surgical History:  Procedure Laterality Date   IMPLANTABLE CARDIOVERTER DEFIBRILLATOR IMPLANT N/A 04/06/2014   MDT MRI compatible dual chamber ICD implanted by Dr Graciela Husbands    Prior to Admission medications   Medication Sig Start Date End Date Taking? Authorizing Provider  allopurinol (ZYLOPRIM) 300 MG tablet Take 300 mg by mouth daily. 02/17/19   [provider]  amLODipine (NORVASC) 2.5 MG tablet Take 2.5 mg by mouth daily. 01/25/19   [provider]  aspirin EC 81 MG tablet Take 81 mg by mouth daily.    [provider]  atorvastatin (LIPITOR) 80 MG tablet Take 80 mg by mouth at bedtime. Patient not taking: Reported on 06/20/2022 12/07/18   [provider]  Cholecalciferol (VITAMIN D PO) Take 1 tablet by mouth daily.    [provider]  gabapentin (NEURONTIN) 300 MG capsule Take 1 capsule by mouth 2 (two) times daily as needed. Patient not taking: Reported on 06/20/2022 06/27/20   [provider]  lidocaine (LIDODERM) 5 % Place 1 patch onto the skin daily. Remove & Discard patch within 12 hours or as directed by MD Patient not taking: Reported on 06/20/2022 06/24/20    Army Melia A, PA-C  lisinopril (ZESTRIL) 40 MG tablet Take 40 mg by mouth daily.    [provider]  metFORMIN (GLUCOPHAGE) 500 MG tablet Take 500 mg by mouth 2 (two) times daily. 02/15/21   [provider]  naproxen (NAPROSYN) 500 MG tablet Take 1 tablet (500 mg total) by mouth 2 (two) times daily with a meal. Patient taking differently: Take 500 mg by mouth as needed. 06/17/20   Joni Reining, PA-C  sildenafil (VIAGRA) 50 MG tablet Take 25 mg by mouth as needed for erectile dysfunction. 11/29/21   [provider]    Allergies as of 06/20/2022   (No Known Allergies)    Family History  Problem Relation Age of Onset   Heart attack Mother    Sudden death Mother    Hypertension Mother    Sudden death Father    Diabetes Other        family history   Drug abuse Brother    Anemia Neg Hx    Arrhythmia Neg Hx    Asthma Neg Hx    Cancer Neg Hx    Depression Neg Hx    Heart failure Neg Hx    Hyperlipidemia Neg Hx    Kidney disease Neg Hx    Thyroid disease Neg Hx    Fainting Neg Hx    Stroke Neg Hx     Social History   Socioeconomic History   Marital status: Widowed  Spouse name: Not on file   Number of children: Not on file   Years of education: Not on file   Highest education level: Not on file  Occupational History   Not on file  Tobacco Use   Smoking status: Never   Smokeless tobacco: Current    Types: Snuff  Vaping Use   Vaping Use: Never used  Substance and Sexual Activity   Alcohol use: Yes    Alcohol/week: 14.0 standard drinks of alcohol    Types: 14 Cans of beer per week    Comment: none this week   Drug use: No   Sexual activity: Yes  Other Topics Concern   Not on file  Social History Narrative   Not on file   Social Determinants of Health   Financial Resource Strain: Not on file  Food Insecurity: Not on file  Transportation Needs: Not on file  Physical Activity: Not on file  Stress: Not on file  Social  Connections: Not on file  Intimate Partner Violence: Not on file    Review of Systems: See HPI, otherwise negative ROS  Physical Exam: BP (!) 150/86   Pulse 60   Temp 97.7 F (36.5 C) (Temporal)   Resp 18   Ht 5\' 6"  (1.676 m)   Wt 111.1 kg   BMI 39.54 kg/m  General:   Alert,  pleasant and cooperative in NAD Head:  Normocephalic and atraumatic. Neck:  Supple; no masses or thyromegaly. Lungs:  Clear throughout to auscultation.    Heart:  Regular rate and rhythm. Abdomen:  Soft, nontender and nondistended. Normal bowel sounds, without guarding, and without rebound.   Neurologic:  Alert and  oriented x4;  grossly normal neurologically.  Impression/Plan: Marc Bradford is here for an colonoscopy to be performed for colon cancer screening  Risks, benefits, limitations, and alternatives regarding  colonoscopy have been reviewed with the patient.  Questions have been answered.  All parties agreeable.   Lannette Donath, MD  07/09/2022, 9:57 AM

## 2022-07-09 NOTE — Anesthesia Preprocedure Evaluation (Signed)
Anesthesia Evaluation  Patient identified by MRN, date of birth, ID band Patient awake    Reviewed: Allergy & Precautions, NPO status , Patient's Chart, lab work & pertinent test results  History of Anesthesia Complications Negative for: history of anesthetic complications  Airway Mallampati: III  TM Distance: >3 FB Neck ROM: full    Dental  (+) Missing   Pulmonary neg shortness of breath, sleep apnea    Pulmonary exam normal        Cardiovascular Exercise Tolerance: Good hypertension, (-) angina Normal cardiovascular exam     Neuro/Psych CVA  negative psych ROS   GI/Hepatic negative GI ROS, Neg liver ROS,neg GERD  ,,  Endo/Other  negative endocrine ROS    Renal/GU negative Renal ROS  negative genitourinary   Musculoskeletal   Abdominal   Peds  Hematology negative hematology ROS (+)   Anesthesia Other Findings Past Medical History: No date: Gout No date: Hyperlipidemia 09/30/2013: Hypertension 09/30/2013: Hypertrophic cardiomyopathy (HCC)     Comment:  a. s/p MDT dual chamber ICD implant 03/2014 followed by               Dr Graciela Husbands 10/28/2014: OSA (obstructive sleep apnea)     Comment:  Severe with AHI 78/hr now on CPAP 09/22/2013: Stroke [facility])     Comment:  a. non-hemorrhagic b. residual left sided weakness  Past Surgical History: 04/06/2014: IMPLANTABLE CARDIOVERTER DEFIBRILLATOR IMPLANT; N/A     Comment:  MDT MRI compatible dual chamber ICD implanted by Dr               Graciela Husbands     Reproductive/Obstetrics negative OB ROS                             Anesthesia Physical Anesthesia Plan  ASA: 3  Anesthesia Plan: General   Post-op Pain Management:    Induction: Intravenous  PONV Risk Score and Plan: Propofol infusion and TIVA  Airway Management Planned: Natural Airway and Nasal Cannula  Additional Equipment:   Intra-op Plan:   Post-operative Plan:   Informed Consent: I  have reviewed the patients History and Physical, chart, labs and discussed the procedure including the risks, benefits and alternatives for the proposed anesthesia with the patient or authorized representative who has indicated his/her understanding and acceptance.     Dental Advisory Given  Plan Discussed with: Anesthesiologist, CRNA and Surgeon  Anesthesia Plan Comments: (Patient consented for risks of anesthesia including but not limited to:  - adverse reactions to medications - risk of airway placement if required - damage to eyes, teeth, lips or other oral mucosa - nerve damage due to positioning  - sore throat or hoarseness - Damage to heart, brain, nerves, lungs, other parts of body or loss of life  Patient voiced understanding.)       Anesthesia Quick Evaluation

## 2022-07-09 NOTE — Anesthesia Procedure Notes (Signed)
Procedure Name: General with mask airway Date/Time: 07/09/2022 10:04 AM  Performed by: Lily Lovings, CRNAPre-anesthesia Checklist: Patient identified, Emergency Drugs available, Suction available, Patient being monitored and Timeout performed Patient Re-evaluated:Patient Re-evaluated prior to induction Oxygen Delivery Method: Simple face mask Preoxygenation: Pre-oxygenation with 100% oxygen Induction Type: IV induction

## 2022-07-10 ENCOUNTER — Encounter: Payer: Self-pay | Admitting: Gastroenterology

## 2022-07-12 LAB — SURGICAL PATHOLOGY

## 2022-07-16 ENCOUNTER — Encounter: Payer: Self-pay | Admitting: Gastroenterology

## 2022-07-24 ENCOUNTER — Ambulatory Visit (INDEPENDENT_AMBULATORY_CARE_PROVIDER_SITE_OTHER): Payer: 59

## 2022-07-24 DIAGNOSIS — I422 Other hypertrophic cardiomyopathy: Secondary | ICD-10-CM | POA: Diagnosis not present

## 2022-07-24 LAB — CUP PACEART REMOTE DEVICE CHECK
Battery Remaining Longevity: 23 mo
Battery Voltage: 2.92 V
Brady Statistic AP VP Percent: 0.2 %
Brady Statistic AP VS Percent: 30.72 %
Brady Statistic AS VP Percent: 0.06 %
Brady Statistic AS VS Percent: 69.01 %
Brady Statistic RA Percent Paced: 29.77 %
Brady Statistic RV Percent Paced: 0.35 %
Date Time Interrogation Session: 20240605001804
HighPow Impedance: 69 Ohm
Implantable Lead Connection Status: 753985
Implantable Lead Connection Status: 753985
Implantable Lead Implant Date: 20160217
Implantable Lead Implant Date: 20160217
Implantable Lead Location: 753859
Implantable Lead Location: 753860
Implantable Lead Model: 5076
Implantable Pulse Generator Implant Date: 20160217
Lead Channel Impedance Value: 342 Ohm
Lead Channel Impedance Value: 418 Ohm
Lead Channel Impedance Value: 456 Ohm
Lead Channel Pacing Threshold Amplitude: 0.75 V
Lead Channel Pacing Threshold Amplitude: 0.75 V
Lead Channel Pacing Threshold Pulse Width: 0.4 ms
Lead Channel Pacing Threshold Pulse Width: 0.4 ms
Lead Channel Sensing Intrinsic Amplitude: 1.75 mV
Lead Channel Sensing Intrinsic Amplitude: 1.75 mV
Lead Channel Sensing Intrinsic Amplitude: 31.625 mV
Lead Channel Sensing Intrinsic Amplitude: 31.625 mV
Lead Channel Setting Pacing Amplitude: 2 V
Lead Channel Setting Pacing Amplitude: 2.5 V
Lead Channel Setting Pacing Pulse Width: 0.4 ms
Lead Channel Setting Sensing Sensitivity: 0.3 mV
Zone Setting Status: 755011

## 2022-08-16 NOTE — Progress Notes (Signed)
Remote ICD transmission.   

## 2022-08-19 ENCOUNTER — Encounter: Payer: Self-pay | Admitting: Urology

## 2022-08-19 ENCOUNTER — Ambulatory Visit (INDEPENDENT_AMBULATORY_CARE_PROVIDER_SITE_OTHER): Payer: 59 | Admitting: Urology

## 2022-08-19 VITALS — BP 137/84 | HR 60 | Ht 66.0 in | Wt 241.0 lb

## 2022-08-19 DIAGNOSIS — N529 Male erectile dysfunction, unspecified: Secondary | ICD-10-CM | POA: Diagnosis not present

## 2022-08-19 MED ORDER — TADALAFIL 10 MG PO TABS
10.0000 mg | ORAL_TABLET | Freq: Every day | ORAL | 11 refills | Status: DC
Start: 2022-08-19 — End: 2023-08-14

## 2022-08-19 NOTE — Progress Notes (Signed)
08/19/22 1:34 PM   Marc Bradford 1960/04/12 [id number]  CC: Erectile dysfunction  HPI: Comorbid 62 year old male with obesity and BMI of 39, cardiomyopathy with MDT dual-chamber ICD implant 2016, sleep apnea, history of stroke who presents with at least 6 months of ED.  Previously was on sildenafil 50 to 100 mg on demand which originally was helpful, but has decreased in effectiveness.  No prior testosterone values to review.  Also has some mild discomfort occasionally with ejaculations.   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 s/p MDT dual chamber ICD implant 03/2014 followed by Dr Graciela Husbands   OSA (obstructive sleep apnea) 10/28/2014   Severe with AHI 78/hr now on CPAP   Stroke (HCC) 09/22/2013   a. non-hemorrhagic b. residual left sided weakness    Surgical History: Past Surgical History:  Procedure Laterality Date   COLONOSCOPY WITH PROPOFOL N/A 07/09/2022   Procedure: COLONOSCOPY WITH PROPOFOL;  Surgeon: Toney Reil, MD;  [facility] ENDOSCOPY;  Service: Gastroenterology;  Laterality: N/A;   IMPLANTABLE CARDIOVERTER DEFIBRILLATOR IMPLANT N/A 04/06/2014   MDT MRI compatible dual chamber ICD implanted by Dr Graciela Husbands   Family History: Family History  Problem Relation Age of Onset   Heart attack Mother    Sudden death Mother    Hypertension Mother    Sudden death Father    Diabetes Other        family history   Drug abuse Brother    Anemia Neg Hx    Arrhythmia Neg Hx    Asthma Neg Hx    Cancer Neg Hx    Depression Neg Hx    Heart failure Neg Hx    Hyperlipidemia Neg Hx    Kidney disease Neg Hx    Thyroid disease Neg Hx    Fainting Neg Hx    Stroke Neg Hx     Social History:  reports that he has never smoked. He has never been exposed to tobacco smoke. His smokeless tobacco use  includes snuff. He reports current alcohol use of about 14.0 standard drinks of alcohol per week. He reports that he does not use drugs.  Physical Exam: BP 137/84   Pulse 60   Ht 5\' 6"  (1.676 m)   Wt 241 lb (109.3 kg)   BMI 38.90 kg/m    Constitutional:  Alert and oriented, No acute distress. Cardiovascular: No clubbing, cyanosis, or edema. Respiratory: Normal respiratory effort, no increased work of breathing. GI: Abdomen is soft, nontender, nondistended, no abdominal masses  Assessment & Plan:   Extremely comorbid 62 year old male with obesity and BMI of 39, cardiomyopathy with MDT dual-chamber ICD implant 2016, sleep apnea, history of stroke who presents with at least 6 months of ED. sildenafil has decreased in effectiveness.  We reviewed the AUA guidelines regarding evaluation management of patients with ED, and I recommended checking morning testosterone, as well as a trial of Cialis 10 mg daily with 10 mg boost dose as needed.  We also reviewed behavioral strategies at length including blood pressure control, weight loss, exercise.  Trial of Cialis 10 mg daily with 10 mg boost dose as needed Follow-up for symptom check and morning testosterone   Legrand Rams, MD 08/19/2022  [facility] [address], [address] [place], [place] [address] [phone]

## 2022-10-01 ENCOUNTER — Other Ambulatory Visit: Payer: 59

## 2022-10-01 DIAGNOSIS — N529 Male erectile dysfunction, unspecified: Secondary | ICD-10-CM

## 2022-10-02 NOTE — Progress Notes (Unsigned)
10/03/2022 1:10 PM   Marc Bradford Jul 09, 1960 [id number]  Referring provider: Oswaldo Conroy, MD [address] [place],  [place] [address]  Urological history: 1.  Erectile dysfunction -Contributing factors of age, heart disease, sleep apnea, CVA, diabetes, hyperlipidemia, hypertension and alcohol consumption -Testosterone level (09/2022) 265  Chief Complaint  Patient presents with   testosterone   HPI: Marc Bradford is a 62 y.o. male who presents today for one month follow up.   Previous records reviewed.   He saw Dr. Richardo Hanks on August 19, 2022 for erectile dysfunction.  He was placed on tadalafil 10 mg daily with a 10 mg boost on days of sexual activity.  SHIM 17  Patient still having spontaneous erections.  He denies any pain or curvature with erections.   He is at goal taking tadalafil 10 mg daily.   SHIM     Row Name 10/03/22 1301         SHIM: Over the last 6 months:   How do you rate your confidence that you could get and keep an erection? Moderate     When you had erections with sexual stimulation, how often were your erections hard enough for penetration (entering your partner)? Most Times (much more than half the time)     During sexual intercourse, how often were you able to maintain your erection after you had penetrated (entered) your partner? Most Times (much more than half the time)     During sexual intercourse, how difficult was it to maintain your erection to completion of intercourse? Very Difficult     When you attempted sexual intercourse, how often was it satisfactory for you? Most Times (much more than half the time)       SHIM Total Score   SHIM 17              Score: 1-7 Severe ED 8-11 Moderate ED 12-16 Mild-Moderate ED 17-21 Mild ED 22-25 No ED     PMH: Past Medical History:  Diagnosis Date   Coronary artery disease    Diabetes mellitus without complication (HCC)    Gout    Hyperlipidemia    Hypertension  09/30/2013   Hypertrophic cardiomyopathy (HCC) 09/30/2013   a. s/p MDT dual chamber ICD implant 03/2014 followed by Dr Graciela Husbands   OSA (obstructive sleep apnea) 10/28/2014   Severe with AHI 78/hr now on CPAP   Stroke (HCC) 09/22/2013   a. non-hemorrhagic b. residual left sided weakness    Surgical History: Past Surgical History:  Procedure Laterality Date   COLONOSCOPY WITH PROPOFOL N/A 07/09/2022   Procedure: COLONOSCOPY WITH PROPOFOL;  Surgeon: Toney Reil, MD;  Location: [facility] ENDOSCOPY;  Service: Gastroenterology;  Laterality: N/A;   IMPLANTABLE CARDIOVERTER DEFIBRILLATOR IMPLANT N/A 04/06/2014   MDT MRI compatible dual chamber ICD implanted by Dr Graciela Husbands    Home Medications:  Allergies as of 10/03/2022   No Known Allergies      Medication List        Accurate as of October 03, 2022  1:10 PM. If you have any questions, ask your nurse or doctor.          allopurinol 300 MG tablet Commonly known as: ZYLOPRIM Take 300 mg by mouth daily.   amLODipine 2.5 MG tablet Commonly known as: NORVASC Take 2.5 mg by mouth daily.   aspirin EC 81 MG tablet Take 81 mg by mouth daily.   atorvastatin 80 MG tablet Commonly known as: LIPITOR Take 80 mg  by mouth at bedtime.   lisinopril 40 MG tablet Commonly known as: ZESTRIL Take 40 mg by mouth daily.   metFORMIN 500 MG tablet Commonly known as: GLUCOPHAGE Take 500 mg by mouth 2 (two) times daily.   sildenafil 50 MG tablet Commonly known as: VIAGRA Take 25 mg by mouth as needed for erectile dysfunction.   tadalafil 10 MG tablet Commonly known as: Cialis Take 1 tablet (10 mg total) by mouth daily. OK TO TAKE ADDITONAL 10MG  BOOST DOSE 30 MINUTES PRIOR TO SEXUAL ACTIVITY   VITAMIN D PO Take 1 tablet by mouth daily.        Allergies: No Known Allergies  Family History: Family History  Problem Relation Age of Onset   Heart attack Mother    Sudden death Mother    Hypertension Mother    Sudden death Father     Diabetes Other        family history   Drug abuse Brother    Anemia Neg Hx    Arrhythmia Neg Hx    Asthma Neg Hx    Cancer Neg Hx    Depression Neg Hx    Heart failure Neg Hx    Hyperlipidemia Neg Hx    Kidney disease Neg Hx    Thyroid disease Neg Hx    Fainting Neg Hx    Stroke Neg Hx     Social History:  reports that he has never smoked. He has never been exposed to tobacco smoke. His smokeless tobacco use includes snuff. He reports current alcohol use of about 14.0 standard drinks of alcohol per week. He reports that he does not use drugs.  ROS: Pertinent ROS in HPI  Physical Exam: BP 120/72   Pulse 60   Wt 244 lb 3.2 oz (110.8 kg)   BMI 39.41 kg/m   Constitutional:  Well nourished. Alert and oriented, No acute distress. HEENT:  AT, moist mucus membranes.  Trachea midline Cardiovascular: No clubbing, cyanosis, or edema. Respiratory: Normal respiratory effort, no increased work of breathing. Neurologic: Grossly intact, no focal deficits, moving all 4 extremities. Psychiatric: Normal mood and affect.  Laboratory Data: Lab Results  Component Value Date   TESTOSTERONE 265 10/01/2022  I have reviewed the labs.   Pertinent Imaging: N/A  Assessment & Plan:    1. ED -At goal with tadalafil 10 mg daily -Testosterone is within normal limits for the lab, but it is under 300 -we will not pursue TRT at this time is having good results with his daily Cialis  Return in about 1 year (around 10/03/2023) for SHIM .  These notes generated with voice recognition software. I apologize for typographical errors.  Cloretta Ned  [facility] [address]  [address] [place], [place] [address] [phone]

## 2022-10-03 ENCOUNTER — Ambulatory Visit (INDEPENDENT_AMBULATORY_CARE_PROVIDER_SITE_OTHER): Payer: 59 | Admitting: Urology

## 2022-10-03 ENCOUNTER — Encounter: Payer: Self-pay | Admitting: Urology

## 2022-10-03 VITALS — BP 120/72 | HR 60 | Wt 244.2 lb

## 2022-10-03 DIAGNOSIS — N529 Male erectile dysfunction, unspecified: Secondary | ICD-10-CM

## 2022-10-23 ENCOUNTER — Ambulatory Visit (INDEPENDENT_AMBULATORY_CARE_PROVIDER_SITE_OTHER): Payer: 59

## 2022-10-23 DIAGNOSIS — I422 Other hypertrophic cardiomyopathy: Secondary | ICD-10-CM | POA: Diagnosis not present

## 2022-10-23 LAB — CUP PACEART REMOTE DEVICE CHECK
Battery Remaining Longevity: 21 mo
Battery Voltage: 2.92 V
Brady Statistic AP VP Percent: 0.64 %
Brady Statistic AP VS Percent: 36.35 %
Brady Statistic AS VP Percent: 0.1 %
Brady Statistic AS VS Percent: 62.9 %
Brady Statistic RA Percent Paced: 34.51 %
Brady Statistic RV Percent Paced: 0.88 %
Date Time Interrogation Session: 20240904012502
HighPow Impedance: 69 Ohm
Implantable Lead Connection Status: 753985
Implantable Lead Connection Status: 753985
Implantable Lead Implant Date: 20160217
Implantable Lead Implant Date: 20160217
Implantable Lead Location: 753859
Implantable Lead Location: 753860
Implantable Lead Model: 5076
Implantable Pulse Generator Implant Date: 20160217
Lead Channel Impedance Value: 342 Ohm
Lead Channel Impedance Value: 418 Ohm
Lead Channel Impedance Value: 456 Ohm
Lead Channel Pacing Threshold Amplitude: 0.75 V
Lead Channel Pacing Threshold Amplitude: 0.75 V
Lead Channel Pacing Threshold Pulse Width: 0.4 ms
Lead Channel Pacing Threshold Pulse Width: 0.4 ms
Lead Channel Sensing Intrinsic Amplitude: 1.375 mV
Lead Channel Sensing Intrinsic Amplitude: 1.375 mV
Lead Channel Sensing Intrinsic Amplitude: 31.625 mV
Lead Channel Sensing Intrinsic Amplitude: 31.625 mV
Lead Channel Setting Pacing Amplitude: 2 V
Lead Channel Setting Pacing Amplitude: 2.5 V
Lead Channel Setting Pacing Pulse Width: 0.4 ms
Lead Channel Setting Sensing Sensitivity: 0.3 mV
Zone Setting Status: 755011

## 2022-11-05 NOTE — Progress Notes (Signed)
Remote ICD transmission.   

## 2023-01-22 ENCOUNTER — Ambulatory Visit (INDEPENDENT_AMBULATORY_CARE_PROVIDER_SITE_OTHER): Payer: 59

## 2023-01-22 DIAGNOSIS — I422 Other hypertrophic cardiomyopathy: Secondary | ICD-10-CM

## 2023-01-22 LAB — CUP PACEART REMOTE DEVICE CHECK
Battery Remaining Longevity: 17 mo
Battery Voltage: 2.9 V
Brady Statistic AP VP Percent: 0.1 %
Brady Statistic AP VS Percent: 25.66 %
Brady Statistic AS VP Percent: 0.08 %
Brady Statistic AS VS Percent: 74.16 %
Brady Statistic RA Percent Paced: 24.86 %
Brady Statistic RV Percent Paced: 0.19 %
Date Time Interrogation Session: 20241204022502
HighPow Impedance: 68 Ohm
Implantable Lead Connection Status: 753985
Implantable Lead Connection Status: 753985
Implantable Lead Implant Date: 20160217
Implantable Lead Implant Date: 20160217
Implantable Lead Location: 753859
Implantable Lead Location: 753860
Implantable Lead Model: 5076
Implantable Pulse Generator Implant Date: 20160217
Lead Channel Impedance Value: 342 Ohm
Lead Channel Impedance Value: 418 Ohm
Lead Channel Impedance Value: 456 Ohm
Lead Channel Pacing Threshold Amplitude: 0.75 V
Lead Channel Pacing Threshold Amplitude: 0.75 V
Lead Channel Pacing Threshold Pulse Width: 0.4 ms
Lead Channel Pacing Threshold Pulse Width: 0.4 ms
Lead Channel Sensing Intrinsic Amplitude: 1.125 mV
Lead Channel Sensing Intrinsic Amplitude: 1.125 mV
Lead Channel Sensing Intrinsic Amplitude: 31.625 mV
Lead Channel Sensing Intrinsic Amplitude: 31.625 mV
Lead Channel Setting Pacing Amplitude: 2 V
Lead Channel Setting Pacing Amplitude: 2.5 V
Lead Channel Setting Pacing Pulse Width: 0.4 ms
Lead Channel Setting Sensing Sensitivity: 0.3 mV
Zone Setting Status: 755011

## 2023-01-29 ENCOUNTER — Encounter: Payer: Self-pay | Admitting: Internal Medicine

## 2023-01-29 ENCOUNTER — Ambulatory Visit: Payer: 59 | Attending: Internal Medicine | Admitting: Internal Medicine

## 2023-01-29 ENCOUNTER — Ambulatory Visit (INDEPENDENT_AMBULATORY_CARE_PROVIDER_SITE_OTHER): Payer: 59

## 2023-01-29 VITALS — BP 120/69 | HR 66 | Ht 66.0 in | Wt 251.2 lb

## 2023-01-29 DIAGNOSIS — I422 Other hypertrophic cardiomyopathy: Secondary | ICD-10-CM

## 2023-01-29 DIAGNOSIS — I4729 Other ventricular tachycardia: Secondary | ICD-10-CM | POA: Diagnosis not present

## 2023-01-29 DIAGNOSIS — R002 Palpitations: Secondary | ICD-10-CM

## 2023-01-29 DIAGNOSIS — Z95810 Presence of automatic (implantable) cardiac defibrillator: Secondary | ICD-10-CM | POA: Diagnosis not present

## 2023-01-29 DIAGNOSIS — Z79899 Other long term (current) drug therapy: Secondary | ICD-10-CM

## 2023-01-29 DIAGNOSIS — I5032 Chronic diastolic (congestive) heart failure: Secondary | ICD-10-CM

## 2023-01-29 NOTE — Patient Instructions (Signed)
Medication Instructions:  Your physician recommends that you continue on your current medications as directed. Please refer to the Current Medication list given to you today.  *If you need a refill on your cardiac medications before your next appointment, please call your pharmacy*   Lab Work: None ordered.  If you have labs (blood work) drawn today and your tests are completely normal, you will receive your results only by: MyChart Message (if you have MyChart) OR A paper copy in the mail If you have any lab test that is abnormal or we need to change your treatment, we will call you to review the results.   Testing/Procedures: Your physician has requested that you have an echocardiogram. Echocardiography is a painless test that uses sound waves to create images of your heart. It provides your doctor with information about the size and shape of your heart and how well your heart's chambers and valves are working. This procedure takes approximately one hour. There are no restrictions for this procedure. Please do NOT wear cologne, perfume, aftershave, or lotions (deodorant is allowed). Please arrive 15 minutes prior to your appointment time.  Please note: We ask at that you not bring children with you during ultrasound (echo/ vascular) testing. Due to room size and safety concerns, children are not allowed in the ultrasound rooms during exams. Our front office staff cannot provide observation of children in our lobby area while testing is being conducted. An adult accompanying a patient to their appointment will only be allowed in the ultrasound room at the discretion of the ultrasound technician under special circumstances. We apologize for any inconvenience.    Follow-Up: At [facility], you and your health needs are our priority.  As part of our continuing mission to provide you with exceptional heart care, we have created designated Provider Care Teams.  These Care Teams include  your primary Cardiologist (physician) and Advanced Practice Providers (APPs -  Physician Assistants and Nurse Practitioners) who all work together to provide you with the care you need, when you need it.  We recommend signing up for the patient portal called "MyChart".  Sign up information is provided on this After Visit Summary.  MyChart is used to connect with patients for Virtual Visits (Telemedicine).  Patients are able to view lab/test results, encounter notes, upcoming appointments, etc.  Non-urgent messages can be sent to your provider as well.   To learn more about what you can do with MyChart, go to [email or web address].    Your next appointment:   12 months in [place] office with Dr Jimmey Ralph  Other Instructions ZIO XT- Long Term Monitor Instructions  Your physician has requested you wear a ZIO patch monitor for 3 days.  This is a single patch monitor. [employer] supplies one patch monitor per enrollment. Additional stickers are not available. Please do not apply patch if you will be having a Nuclear Stress Test,  Echocardiogram, Cardiac CT, MRI, or Chest Xray during the period you would be wearing the  monitor. The patch cannot be worn during these tests. You cannot remove and re-apply the  ZIO XT patch monitor.  Your ZIO patch monitor will be mailed 3 day USPS to your address on file. It may take 3-5 days  to receive your monitor after you have been enrolled.  Once you have received your monitor, please review the enclosed instructions. Your monitor  has already been registered assigning a specific monitor serial # to you.  Billing and Patient Assistance Program Information  We have supplied [employer] with any of your insurance information on file for billing purposes. [employer] offers a sliding scale Patient Assistance Program for patients that do not have  insurance, or whose insurance does not completely cover the cost of the ZIO monitor.  You must apply for the Patient  Assistance Program to qualify for this discounted rate.  To apply, please call [employer] at [phone], select option 4, select option 2, ask to apply for  Patient Assistance Program. Meredeth Ide will ask your household income, and how many people  are in your household. They will quote your out-of-pocket cost based on that information.  [employer] will also be able to set up a 47-month, interest-free payment plan if needed.  Applying the monitor   Shave hair from upper left chest.  Hold abrader disc by orange tab. Rub abrader in 40 strokes over the upper left chest as  indicated in your monitor instructions.  Clean area with 4 enclosed alcohol pads. Let dry.  Apply patch as indicated in monitor instructions. Patch will be placed under collarbone on left  side of chest with arrow pointing upward.  Rub patch adhesive wings for 2 minutes. Remove white label marked "1". Remove the white  label marked "2". Rub patch adhesive wings for 2 additional minutes.  While looking in a mirror, press and release button in center of patch. A small green light will  flash 3-4 times. This will be your only indicator that the monitor has been turned on.  Do not shower for the first 24 hours. You may shower after the first 24 hours.  Press the button if you feel a symptom. You will hear a small click. Record Date, Time and  Symptom in the Patient Logbook.  When you are ready to remove the patch, follow instructions on the last 2 pages of Patient  Logbook. Stick patch monitor onto the last page of Patient Logbook.  Place Patient Logbook in the blue and white box. Use locking tab on box and tape box closed  securely. The blue and white box has prepaid postage on it. Please place it in the mailbox as  soon as possible. Your physician should have your test results approximately 7 days after the  monitor has been mailed back to [facility].  Call [facility] Customer Care at [phone] if you have questions  regarding  your ZIO XT patch monitor. Call them immediately if you see an orange light blinking on your  monitor.  If your monitor falls off in less than 4 days, contact our Monitor department at [phone].  If your monitor becomes loose or falls off after 4 days call [employer] at [phone] for  suggestions on securing your monitor

## 2023-01-29 NOTE — Progress Notes (Signed)
Please call     Patient Care Team: Oswaldo Conroy, MD as PCP - General (Family Medicine) Duke Salvia, MD as PCP - Cardiology (Cardiology) Quintella Reichert, MD as PCP - Sleep Medicine (Cardiology)   HPI  Marc Bradford is a 62 y.o. male Seen in follow-up for apical hypertrophic cardiomyopathy. MRI>>>  significant gadolinium enhancement. He had nonsustained ventricular tachycardia and underwent primary prevention ICD Medtronic     prior stroke with residual weakness as well as hypertension    The patient denies chest pain, shortness of breath, nocturnal dyspnea, orthopnea or peripheral edema.  There have been no lightheadedness or syncope.  Occasional palpitations.   Date Cr K  10/17 0.8 3.4  7/19  0.84 4.2  8/21 0.87 3.6       DATE TEST EF   8/15 Echo   55-60 % Apical hypertrophy   11/15 cMRI  66 % Diffuse LGE Apical HCM                  Past Medical History:  Diagnosis Date   Coronary artery disease    Diabetes mellitus without complication (HCC)    Gout    Hyperlipidemia    Hypertension 09/30/2013   Hypertrophic cardiomyopathy (HCC) 09/30/2013   a. s/p MDT dual chamber ICD implant 03/2014 followed by Dr Graciela Husbands   OSA (obstructive sleep apnea) 10/28/2014   Severe with AHI 78/hr now on CPAP   Stroke (HCC) 09/22/2013   a. non-hemorrhagic b. residual left sided weakness    Past Surgical History:  Procedure Laterality Date   COLONOSCOPY WITH PROPOFOL N/A 07/09/2022   Procedure: COLONOSCOPY WITH PROPOFOL;  Surgeon: Toney Reil, MD;  Location: [facility] ENDOSCOPY;  Service: Gastroenterology;  Laterality: N/A;   IMPLANTABLE CARDIOVERTER DEFIBRILLATOR IMPLANT N/A 04/06/2014   MDT MRI compatible dual chamber ICD implanted by Dr Graciela Husbands    Current Outpatient Medications  Medication Sig Dispense Refill   allopurinol (ZYLOPRIM) 300 MG tablet Take 300 mg by mouth daily.     amLODipine (NORVASC) 2.5 MG tablet Take 2.5 mg by mouth daily.     aspirin EC 81 MG tablet  Take 81 mg by mouth daily.     atorvastatin (LIPITOR) 80 MG tablet Take 80 mg by mouth at bedtime.     Cholecalciferol (VITAMIN D PO) Take 1 tablet by mouth daily.     lisinopril (ZESTRIL) 40 MG tablet Take 40 mg by mouth daily.     metFORMIN (GLUCOPHAGE) 500 MG tablet Take 500 mg by mouth 2 (two) times daily.     tadalafil (CIALIS) 10 MG tablet Take 1 tablet (10 mg total) by mouth daily. OK TO TAKE ADDITONAL 10MG  BOOST DOSE 30 MINUTES PRIOR TO SEXUAL ACTIVITY 30 tablet 11   No current facility-administered medications for this visit.    No Known Allergies    Review of Systems negative except from HPI and PMH  Physical Exam BP 120/69   Pulse 66   Ht 5\' 6"  (1.676 m)   Wt 251 lb 3.2 oz (113.9 kg)   SpO2 94%   BMI 40.54 kg/m  Well developed Morbidly obese  in no acute distress HENT normal Neck supple with JVP-flat Clear Device pocket well healed; without hematoma or erythema.  There is no tethering  Regular rate and rhythm, 2/6 murmur Abd-soft with active BS No Clubbing cyanosis  edema Skin-warm and dry A & Oriented  Grossly normal sensory and motor function  ECG sinus at 66 Intervals 30/10/46 PVCs in  a pattern of trigeminy alternating with bigeminy with a right bundle inferior axis morphology and intrinsicoid deflection of about 140 ms  Device function is normal. Programming changes none  See Paceart for details PVC estimate is about 6/min  Assessment and  Plan  HCM-atypical variant  VT nonsustained  ICD Medtronic    HFpEF  Morbidly obese\  Hypertension  PVC>> 2% per counter   Functional status is stable.  Again have encouraged him to weight loss.  No interval tachypalpitations or tachy therapies. Frequent PVCs we will quantitate with a Zio patch and will also reassess left ventricular function as either these might prompt more aggressive therapy   Blood pressure is well-controlled.  Continue on the lisinopril 40 and amlodipine 2.5.

## 2023-01-29 NOTE — Progress Notes (Unsigned)
Enrolled patient for a 3 day Zio XT monitor to be mailed to patients home  

## 2023-02-02 DIAGNOSIS — R002 Palpitations: Secondary | ICD-10-CM | POA: Diagnosis not present

## 2023-02-02 LAB — CUP PACEART INCLINIC DEVICE CHECK
Date Time Interrogation Session: 20241211131236
Implantable Lead Connection Status: 753985
Implantable Lead Connection Status: 753985
Implantable Lead Implant Date: 20160217
Implantable Lead Implant Date: 20160217
Implantable Lead Location: 753859
Implantable Lead Location: 753860
Implantable Lead Model: 5076
Implantable Pulse Generator Implant Date: 20160217

## 2023-02-25 ENCOUNTER — Ambulatory Visit: Payer: 59 | Attending: Internal Medicine

## 2023-02-25 DIAGNOSIS — I422 Other hypertrophic cardiomyopathy: Secondary | ICD-10-CM | POA: Diagnosis not present

## 2023-02-25 LAB — ECHOCARDIOGRAM COMPLETE
AR max vel: 2.98 cm2
AV Area VTI: 2.99 cm2
AV Area mean vel: 2.99 cm2
AV Mean grad: 4 mm[Hg]
AV Peak grad: 7.3 mm[Hg]
Ao pk vel: 1.35 m/s
Area-P 1/2: 4.6 cm2
S' Lateral: 3.3 cm

## 2023-02-25 MED ORDER — PERFLUTREN LIPID MICROSPHERE
1.0000 mL | INTRAVENOUS | Status: AC | PRN
Start: 2023-02-25 — End: 2023-02-25
  Administered 2023-02-25: 2 mL via INTRAVENOUS

## 2023-04-23 ENCOUNTER — Ambulatory Visit (INDEPENDENT_AMBULATORY_CARE_PROVIDER_SITE_OTHER): Payer: 59

## 2023-04-23 DIAGNOSIS — I422 Other hypertrophic cardiomyopathy: Secondary | ICD-10-CM

## 2023-04-25 LAB — CUP PACEART REMOTE DEVICE CHECK
Battery Remaining Longevity: 16 mo
Battery Voltage: 2.9 V
Brady Statistic AP VP Percent: 0.09 %
Brady Statistic AP VS Percent: 22.01 %
Brady Statistic AS VP Percent: 0.07 %
Brady Statistic AS VS Percent: 77.84 %
Brady Statistic RA Percent Paced: 21.36 %
Brady Statistic RV Percent Paced: 0.17 %
Date Time Interrogation Session: 20250305022703
HighPow Impedance: 67 Ohm
Implantable Lead Connection Status: 753985
Implantable Lead Connection Status: 753985
Implantable Lead Implant Date: 20160217
Implantable Lead Implant Date: 20160217
Implantable Lead Location: 753859
Implantable Lead Location: 753860
Implantable Lead Model: 5076
Implantable Pulse Generator Implant Date: 20160217
Lead Channel Impedance Value: 304 Ohm
Lead Channel Impedance Value: 418 Ohm
Lead Channel Impedance Value: 456 Ohm
Lead Channel Pacing Threshold Amplitude: 0.75 V
Lead Channel Pacing Threshold Amplitude: 0.75 V
Lead Channel Pacing Threshold Pulse Width: 0.4 ms
Lead Channel Pacing Threshold Pulse Width: 0.4 ms
Lead Channel Sensing Intrinsic Amplitude: 1.625 mV
Lead Channel Sensing Intrinsic Amplitude: 1.625 mV
Lead Channel Sensing Intrinsic Amplitude: 31.625 mV
Lead Channel Sensing Intrinsic Amplitude: 31.625 mV
Lead Channel Setting Pacing Amplitude: 2 V
Lead Channel Setting Pacing Amplitude: 2.5 V
Lead Channel Setting Pacing Pulse Width: 0.4 ms
Lead Channel Setting Sensing Sensitivity: 0.3 mV
Zone Setting Status: 755011

## 2023-05-31 ENCOUNTER — Encounter: Payer: Self-pay | Admitting: Internal Medicine

## 2023-06-10 NOTE — Progress Notes (Signed)
 Remote ICD transmission.

## 2023-06-10 NOTE — Addendum Note (Signed)
 Addended by: Eimi Viney A on: 06/10/2023 02:39 PM   Modules accepted: Orders

## 2023-07-11 ENCOUNTER — Encounter: Payer: Self-pay | Admitting: Family Medicine

## 2023-07-17 ENCOUNTER — Telehealth: Payer: Self-pay

## 2023-07-17 NOTE — Telephone Encounter (Signed)
 Colonoscopy performed with Dr. Baldomero Bone already 07/09/22.  Results letter indicated colonoscopy is not due for 10 years.  Patient has been made aware of this.  Thanks, [place], [place]

## 2023-07-17 NOTE — Telephone Encounter (Signed)
 The patient and his friend, Blaise Bumps, called and left a voicemail expressing concern about a letter they received regarding a colonoscopy. They are confused because the patient had a procedure last year and believed the interval was every 10 years. The patient has [employer] and requested a callback.  I returned the call and informed them that we received their message. The patient stated that if his insurance does not cover the procedure, he cannot pay for it out of pocket. He mentioned that only a few polyps were found during his last colonoscopy and that he thought he did not need another procedure for 10 years. I informed the patient that I would transfer him to the scheduler for further assistance.

## 2023-07-23 ENCOUNTER — Ambulatory Visit (INDEPENDENT_AMBULATORY_CARE_PROVIDER_SITE_OTHER): Payer: Medicare Other

## 2023-07-23 DIAGNOSIS — I422 Other hypertrophic cardiomyopathy: Secondary | ICD-10-CM

## 2023-07-23 LAB — CUP PACEART REMOTE DEVICE CHECK
Battery Remaining Longevity: 12 mo
Battery Voltage: 2.88 V
Brady Statistic AP VP Percent: 0.46 %
Brady Statistic AP VS Percent: 33.25 %
Brady Statistic AS VP Percent: 0.09 %
Brady Statistic AS VS Percent: 66.2 %
Brady Statistic RA Percent Paced: 32.23 %
Brady Statistic RV Percent Paced: 0.71 %
Date Time Interrogation Session: 20250604012504
HighPow Impedance: 73 Ohm
Implantable Lead Connection Status: 753985
Implantable Lead Connection Status: 753985
Implantable Lead Implant Date: 20160217
Implantable Lead Implant Date: 20160217
Implantable Lead Location: 753859
Implantable Lead Location: 753860
Implantable Lead Model: 5076
Implantable Pulse Generator Implant Date: 20160217
Lead Channel Impedance Value: 342 Ohm
Lead Channel Impedance Value: 418 Ohm
Lead Channel Impedance Value: 475 Ohm
Lead Channel Pacing Threshold Amplitude: 0.75 V
Lead Channel Pacing Threshold Amplitude: 0.75 V
Lead Channel Pacing Threshold Pulse Width: 0.4 ms
Lead Channel Pacing Threshold Pulse Width: 0.4 ms
Lead Channel Sensing Intrinsic Amplitude: 0.875 mV
Lead Channel Sensing Intrinsic Amplitude: 0.875 mV
Lead Channel Sensing Intrinsic Amplitude: 31.625 mV
Lead Channel Sensing Intrinsic Amplitude: 31.625 mV
Lead Channel Setting Pacing Amplitude: 2 V
Lead Channel Setting Pacing Amplitude: 2.5 V
Lead Channel Setting Pacing Pulse Width: 0.4 ms
Lead Channel Setting Sensing Sensitivity: 0.3 mV
Zone Setting Status: 755011

## 2023-08-01 ENCOUNTER — Ambulatory Visit: Payer: Self-pay | Admitting: Cardiology

## 2023-08-13 ENCOUNTER — Other Ambulatory Visit: Payer: Self-pay | Admitting: Urology

## 2023-09-15 NOTE — Progress Notes (Signed)
 Remote ICD transmission.

## 2023-10-06 ENCOUNTER — Encounter: Payer: Self-pay | Admitting: Dermatology

## 2023-10-06 ENCOUNTER — Ambulatory Visit (INDEPENDENT_AMBULATORY_CARE_PROVIDER_SITE_OTHER): Admitting: Dermatology

## 2023-10-06 DIAGNOSIS — L821 Other seborrheic keratosis: Secondary | ICD-10-CM

## 2023-10-06 DIAGNOSIS — L578 Other skin changes due to chronic exposure to nonionizing radiation: Secondary | ICD-10-CM

## 2023-10-06 DIAGNOSIS — D1801 Hemangioma of skin and subcutaneous tissue: Secondary | ICD-10-CM | POA: Diagnosis not present

## 2023-10-06 DIAGNOSIS — D489 Neoplasm of uncertain behavior, unspecified: Secondary | ICD-10-CM | POA: Diagnosis not present

## 2023-10-06 DIAGNOSIS — W908XXA Exposure to other nonionizing radiation, initial encounter: Secondary | ICD-10-CM

## 2023-10-06 DIAGNOSIS — Z1283 Encounter for screening for malignant neoplasm of skin: Secondary | ICD-10-CM

## 2023-10-06 DIAGNOSIS — L918 Other hypertrophic disorders of the skin: Secondary | ICD-10-CM

## 2023-10-06 DIAGNOSIS — D0421 Carcinoma in situ of skin of right ear and external auricular canal: Secondary | ICD-10-CM | POA: Diagnosis not present

## 2023-10-06 DIAGNOSIS — D099 Carcinoma in situ, unspecified: Secondary | ICD-10-CM

## 2023-10-06 DIAGNOSIS — L280 Lichen simplex chronicus: Secondary | ICD-10-CM

## 2023-10-06 DIAGNOSIS — D229 Melanocytic nevi, unspecified: Secondary | ICD-10-CM

## 2023-10-06 DIAGNOSIS — L739 Follicular disorder, unspecified: Secondary | ICD-10-CM

## 2023-10-06 HISTORY — DX: Carcinoma in situ, unspecified: D09.9

## 2023-10-06 MED ORDER — CLINDAMYCIN PHOSPHATE 1 % EX LOTN: TOPICAL_LOTION | Freq: Two times a day (BID) | CUTANEOUS | 11 refills | Status: AC

## 2023-10-06 NOTE — Progress Notes (Signed)
 New Patient Visit   Subjective  Marc Bradford is a 63 y.o. male who presents for the following: Skin Cancer Screening and Full Body Skin Exam. Patient has areas of concern on his R ear and L scalp. Patient reports no hx of skin cancer; no known family hx of skin cancer.   The patient presents for Total-Body Skin Exam (TBSE) for skin cancer screening and mole check. The patient has spots, moles and lesions to be evaluated, some may be new or changing and the patient may have concern these could be cancer.    The following portions of the chart were reviewed this encounter and updated as appropriate: medications, allergies, medical history  Review of Systems:  No other skin or systemic complaints except as noted in HPI or Assessment and Plan.  Objective  Well appearing patient in no apparent distress; mood and affect are within normal limits.  A full examination was performed including scalp, head, eyes, ears, nose, lips, neck, chest, axillae, abdomen, back, buttocks, bilateral upper extremities, bilateral lower extremities, hands, feet, fingers, toes, fingernails, and toenails. All findings within normal limits unless otherwise noted below.   Relevant physical exam findings are noted in the Assessment and Plan.  R midhelix 1cm hyperpigmented eroded scaly plaque   Assessment & Plan   SKIN CANCER SCREENING PERFORMED TODAY.  ACTINIC DAMAGE - Chronic condition, secondary to cumulative UV/sun exposure - diffuse scaly erythematous macules with underlying dyspigmentation - Recommend daily broad spectrum sunscreen SPF 30+ to sun-exposed areas, reapply every 2 hours as needed.  - Staying in the shade or wearing long sleeves, sun glasses (UVA+UVB protection) and wide brim hats (4-inch brim around the entire circumference of the hat) are also recommended for sun protection.  - Call for new or changing lesions.  Acrochordons, SEBORRHEIC KERATOSES, HEMANGIOMAS - Benign normal skin lesions -  Benign-appearing - Call for any changes  MELANOCYTIC NEVI - Tan-brown and/or pink-flesh-colored symmetric macules and papules - Benign appearing on exam today - Observation - Call clinic for new or changing moles - Recommend daily use of broad spectrum spf 30+ sunscreen to sun-exposed areas.   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    LICHEN SIMPLEX CHRONICUS/PRURIGO NODULARIS Exam: Excoriated lichenified papules and/or plaques at L knee  Chronic condition with duration or expected duration over one year. Currently well-controlled.   Lichen simplex chronicus [facility]) is a persistent itchy area of thickened skin that is induced by chronic rubbing and/or scratching (chronic dermatitis).  These areas may be pink, hyperpigmented and may have excoriations and bumps (prurigo nodules- PN).  LSC/PN is commonly observed in uncontrolled atopic dermatitis and other forms of eczema, and in other itchy skin conditions (eg, insect bites, scabies).  Sometimes it is not possible to know initial cause of LSC/PN if it has been present for a long time.  It generally responds well to treatment with high potency topical steroids.  It is important to stop rubbing/scratching the area in order to break the itch-scratch-rash-itch cycle, in order for the rash to resolve.   Treatment Plan: No treatment indicated today Avoid picking/rubbing/scratching   NEOPLASM OF UNCERTAIN BEHAVIOR R midhelix Skin / nail biopsy Type of biopsy: tangential   Informed consent: discussed and consent obtained   Patient was prepped and draped in usual sterile fashion: Area prepped with alcohol. Anesthesia: the lesion was anesthetized in  a standard fashion   Anesthetic:  1% lidocaine  w/ epinephrine 1-100,000 buffered w/ 8.4% NaHCO3 Instrument used: flexible razor blade    Hemostasis achieved with: pressure, aluminum chloride and electrodesiccation   Outcome: patient tolerated procedure well   Post-procedure details: wound care instructions given   Post-procedure details comment:  Ointment and small bandage applied Additional details:  1cm hyperpigmented eroded scaly plaque R midhelix  Specimen 1 - Surgical pathology Differential Diagnosis: chondrodermatitis nodularis helicis vs SCC vs BLE  Check Margins: No MULTIPLE BENIGN NEVI   SEBORRHEIC KERATOSES   CHERRY ANGIOMA   ACTINIC ELASTOSIS   ACROCHORDON   LSC (LICHEN SIMPLEX CHRONICUS)   FOLLICULITIS    Return if symptoms worsen or fail to improve, for pending biopsy results.  I, Emerick Ege, CMA am acting as scribe for Boneta Sharps, MD.   Documentation: I have reviewed the above documentation for accuracy and completeness, and I agree with the above.  Boneta Sharps, MD

## 2023-10-06 NOTE — Progress Notes (Deleted)
 10/07/2023 5:03 PM   Marc Bradford 1960-10-07 [id number]  Referring provider: Kandis Stefano Iles, MD [address] [place],  [place] [address]  Urological history: 1.  Erectile dysfunction -Contributing factors of age, heart disease, sleep apnea, CVA, diabetes, hyperlipidemia, hypertension and alcohol consumption -Testosterone  level (09/2022) 265  No chief complaint on file.  HPI: Marc Bradford is a 63 y.o. male who presents today for one month follow up.   Previous records reviewed.      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 s/p MDT dual chamber ICD implant 03/2014 followed by Dr Fernande   OSA (obstructive sleep apnea) 10/28/2014   Severe with AHI 78/hr now on CPAP   Stroke (HCC) 09/22/2013   a. non-hemorrhagic b. residual left sided weakness    Surgical History: Past Surgical History:  Procedure Laterality Date   COLONOSCOPY WITH PROPOFOL  N/A 07/09/2022   Procedure: COLONOSCOPY WITH PROPOFOL ;  Surgeon: Unk Corinn Skiff, MD;  Location: [facility] ENDOSCOPY;  Service: Gastroenterology;  Laterality: N/A;   IMPLANTABLE CARDIOVERTER DEFIBRILLATOR IMPLANT N/A 04/06/2014   MDT MRI compatible dual chamber ICD implanted by Dr Fernande    Home Medications:  Allergies as of 10/07/2023   No Known Allergies      Medication List        Accurate as of October 06, 2023  5:03 PM. If you have any questions, ask your nurse or doctor.          allopurinol 300 MG tablet Commonly known as: ZYLOPRIM Take 300 mg by mouth daily.   amLODipine 2.5 MG tablet Commonly known as: NORVASC Take 2.5 mg by mouth daily.   aspirin  EC 81 MG tablet Take 81 mg by mouth daily.   atorvastatin 80 MG tablet Commonly known as: LIPITOR Take 80 mg by mouth at bedtime.   clindamycin  1 %  lotion Commonly known as: Cleocin -T Apply topically 2 (two) times daily. To scalp until resolved.   lisinopril  40 MG tablet Commonly known as: ZESTRIL  Take 40 mg by mouth daily.   metFORMIN 500 MG tablet Commonly known as: GLUCOPHAGE Take 500 mg by mouth 2 (two) times daily.   tadalafil  10 MG tablet Commonly known as: CIALIS  TAKE 1 TABLET BY MOUTH ONCE DAILY. OK TO TAKE ADDITIONAL 10 MG BOOST DOSE 30 MINUTES PRIOR TO SEXUAL ACTIVITY   VITAMIN D PO Take 1 tablet by mouth daily.        Allergies: No Known Allergies  Family History: Family History  Problem Relation Age of Onset   Heart attack Mother    Sudden death Mother    Hypertension Mother    Sudden death Father    Diabetes Other        family history   Drug abuse Brother    Anemia Neg Hx    Arrhythmia Neg Hx    Asthma Neg Hx    Cancer Neg Hx    Depression Neg Hx    Heart failure Neg Hx    Hyperlipidemia Neg Hx    Kidney disease Neg Hx    Thyroid disease Neg Hx    Fainting Neg Hx    Stroke Neg Hx     Social History:  reports that he has never smoked. He has never been exposed to tobacco smoke. His smokeless  tobacco use includes snuff. He reports current alcohol use of about 14.0 standard drinks of alcohol per week. He reports that he does not use drugs.  ROS: Pertinent ROS in HPI  Physical Exam: There were no vitals taken for this visit.  Constitutional:  Well nourished. Alert and oriented, No acute distress. HEENT: [place] AT, moist mucus membranes.  Trachea midline, no masses. Cardiovascular: No clubbing, cyanosis, or edema. Respiratory: Normal respiratory effort, no increased work of breathing. GI: Abdomen is soft, non tender, non distended, no abdominal masses. Liver and spleen not palpable.  No hernias appreciated.  Stool sample for occult testing is not indicated.   GU: No CVA tenderness.  No bladder fullness or masses.  Patient with circumcised/uncircumcised phallus. ***Foreskin easily retracted***   Urethral meatus is patent.  No penile discharge. No penile lesions or rashes. Scrotum without lesions, cysts, rashes and/or edema.  Testicles are located scrotally bilaterally. No masses are appreciated in the testicles. Left and right epididymis are normal. Rectal: Patient with  normal sphincter tone. Anus and perineum without scarring or rashes. No rectal masses are appreciated. Prostate is approximately *** grams, *** nodules are appreciated. Seminal vesicles are normal. Skin: No rashes, bruises or suspicious lesions. Lymph: No cervical or inguinal adenopathy. Neurologic: Grossly intact, no focal deficits, moving all 4 extremities. Psychiatric: Normal mood and affect.   Laboratory Data: See HPI and EPIC *** I have reviewed the labs.   Pertinent Imaging: N/A  Assessment & Plan:    1. ED -At goal with tadalafil  10 mg daily -Testosterone  is within normal limits for the lab, but it is under 300 -we will not pursue TRT at this time is having good results with his daily Cialis   2. PSA screening ***  No follow-ups on file.  These notes generated with voice recognition software. I apologize for typographical errors.  CLOTILDA HELON RIGGERS  [facility] [address]  [address] [place], [place] [address] [phone]

## 2023-10-06 NOTE — Patient Instructions (Addendum)
 Start clindamycin  lotion apply to scalp twice daily until rash resolved.    Wound Care Instructions  Cleanse wound gently with soap and water  once a day then pat dry with clean gauze. Apply a thin coat of Petrolatum (petroleum jelly, Vaseline) over the wound (unless you have an allergy to this). We recommend that you use a new, sterile tube of Vaseline. Do not pick or remove scabs. Do not remove the yellow or white healing tissue from the base of the wound.  Cover the wound with fresh, clean, nonstick gauze and secure with paper tape. You may use Band-Aids in place of gauze and tape if the wound is small enough, but would recommend trimming much of the tape off as there is often too much. Sometimes Band-Aids can irritate the skin.  You should call the office for your biopsy report after 1 week if you have not already been contacted.  If you experience any problems, such as abnormal amounts of bleeding, swelling, significant bruising, significant pain, or evidence of infection, please call the office immediately.  FOR ADULT SURGERY PATIENTS: If you need something for pain relief you may take 1 extra strength Tylenol  (acetaminophen ) AND 2 Ibuprofen (200mg  each) together every 4 hours as needed for pain. (do not take these if you are allergic to them or if you have a reason you should not take them.) Typically, you may only need pain medication for 1 to 3 days.        Melanoma ABCDEs  Melanoma is the most dangerous type of skin cancer, and is the leading cause of death from skin disease.  You are more likely to develop melanoma if you: Have light-colored skin, light-colored eyes, or red or blond hair Spend a lot of time in the sun Tan regularly, either outdoors or in a tanning bed Have had blistering sunburns, especially during childhood Have a close family member who has had a melanoma Have atypical moles or large birthmarks  Early detection of melanoma is key since treatment is  typically straightforward and cure rates are extremely high if we catch it early.   The first sign of melanoma is often a change in a mole or a new dark spot.  The ABCDE system is a way of remembering the signs of melanoma.  A for asymmetry:  The two halves do not match. B for border:  The edges of the growth are irregular. C for color:  A mixture of colors are present instead of an even brown color. D for diameter:  Melanomas are usually (but not always) greater than 6mm - the size of a pencil eraser. E for evolution:  The spot keeps changing in size, shape, and color.  Please check your skin once per month between visits. You can use a small mirror in front and a large mirror behind you to keep an eye on the back side or your body.   If you see any new or changing lesions before your next follow-up, please call to schedule a visit.  Please continue daily skin protection including broad spectrum sunscreen SPF 30+ to sun-exposed areas, reapplying every 2 hours as needed when you're outdoors.     Recommend daily broad spectrum sunscreen SPF 30+ to sun-exposed areas, reapply every 2 hours as needed. Call for new or changing lesions.  Staying in the shade or wearing long sleeves, sun glasses (UVA+UVB protection) and wide brim hats (4-inch brim around the entire circumference of the hat) are also recommended for sun  protection.     Due to recent changes in healthcare laws, you may see results of your pathology and/or laboratory studies on MyChart before the doctors have had a chance to review them. We understand that in some cases there may be results that are confusing or concerning to you. Please understand that not all results are received at the same time and often the doctors may need to interpret multiple results in order to provide you with the best plan of care or course of treatment. Therefore, we ask that you please give us  2 business days to thoroughly review all your results before  contacting the office for clarification. Should we see a critical lab result, you will be contacted sooner.   If You Need Anything After Your Visit  If you have any questions or concerns for your doctor, please call our main line at [phone] and press option 4 to reach your doctor's medical assistant. If no one answers, please leave a voicemail as directed and we will return your call as soon as possible. Messages left after 4 pm will be answered the following business day.   You may also send us  a message via MyChart. We typically respond to MyChart messages within 1-2 business days.  For prescription refills, please ask your pharmacy to contact our office. Our fax number is [phone].  If you have an urgent issue when the clinic is closed that cannot wait until the next business day, you can page your doctor at the number below.    Please note that while we do our best to be available for urgent issues outside of office hours, we are not available 24/7.   If you have an urgent issue and are unable to reach us , you may choose to seek medical care at your doctor's office, retail clinic, urgent care center, or emergency room.  If you have a medical emergency, please immediately call 911 or go to the emergency department.  Pager Numbers  - Dr. Hester: [phone]  - Dr. Jackquline: [phone]  - Dr. Claudene: [phone]   In the event of inclement weather, please call our main line at [phone] for an update on the status of any delays or closures.  Dermatology Medication Tips: Please keep the boxes that topical medications come in in order to help keep track of the instructions about where and how to use these. Pharmacies typically print the medication instructions only on the boxes and not directly on the medication tubes.   If your medication is too expensive, please contact our office at [phone] option 4 or send us  a message through MyChart.   We are unable to tell  what your co-pay for medications will be in advance as this is different depending on your insurance coverage. However, we may be able to find a substitute medication at lower cost or fill out paperwork to get insurance to cover a needed medication.   If a prior authorization is required to get your medication covered by your insurance company, please allow us  1-2 business days to complete this process.  Drug prices often vary depending on where the prescription is filled and some pharmacies may offer cheaper prices.  The website www.goodrx.com contains coupons for medications through different pharmacies. The prices here do not account for what the cost may be with help from insurance (it may be cheaper with your insurance), but the website can give you the price if you did not use any insurance.  - You can print  the associated coupon and take it with your prescription to the pharmacy.  - You may also stop by our office during regular business hours and pick up a GoodRx coupon card.  - If you need your prescription sent electronically to a different pharmacy, notify our office through [facility] or by phone at [phone] option 4.     Si Usted Necesita Algo Despus de Su Visita  Tambin puede enviarnos un mensaje a travs de Clinical cytogeneticist. Por lo general respondemos a los mensajes de MyChart en el transcurso de 1 a 2 das hbiles.  Para renovar recetas, por favor pida a su farmacia que se ponga en contacto con nuestra oficina. Randi lakes de fax es [place] [phone].  Si tiene un asunto urgente cuando la clnica est cerrada y que no puede esperar hasta el siguiente da hbil, puede llamar/localizar a su doctor(a) al nmero que aparece a continuacin.   Por favor, tenga en cuenta que aunque hacemos todo lo posible para estar disponibles para asuntos urgentes fuera del horario de [place], no estamos disponibles las 24 horas del da, los 7 [address]  [address] de la [place].   Si tiene un problema  urgente y no puede comunicarse con nosotros, puede optar por buscar atencin mdica  en el consultorio de su doctor(a), en una clnica privada, en un centro de atencin urgente o en una sala de emergencias.  Si tiene Engineer, drilling, por favor llame inmediatamente al 911 o vaya a la sala de emergencias.  Nmeros de bper  - Dr. Hester: [phone]  - Dra. Jackquline: [phone]  - Dr. Claudene: [phone]   En caso de inclemencias del tiempo, por favor llame a landry capes principal al [phone] para una actualizacin sobre el [place] de cualquier retraso o cierre.  Consejos para la medicacin en dermatologa: Por favor, guarde las cajas en las que vienen los medicamentos de uso tpico para ayudarle a seguir las instrucciones sobre dnde y cmo usarlos. Las farmacias generalmente imprimen las instrucciones del medicamento slo en las cajas y no directamente en los tubos del [place].   Si su medicamento es muy caro, por favor, pngase en contacto con landry rieger llamando al [phone] y presione la opcin 4 o envenos un mensaje a travs de Clinical cytogeneticist.   No podemos decirle cul ser su copago por los medicamentos por adelantado ya que esto es diferente dependiendo de la cobertura de su seguro. Sin embargo, es posible que podamos encontrar un medicamento sustituto a Audiological scientist un formulario para que el seguro cubra el medicamento que se considera necesario.   Si se requiere una autorizacin previa para que su compaa de seguros malta su medicamento, por favor permtanos de 1 a 2 das hbiles para completar este proceso.  Los precios de los medicamentos varan con frecuencia dependiendo del Environmental consultant de dnde se surte la receta y alguna farmacias pueden ofrecer precios ms baratos.  El sitio web www.goodrx.com tiene cupones para medicamentos de Health and safety inspector. Los precios aqu no tienen en cuenta lo que podra costar con la ayuda del seguro (puede ser ms barato  con su seguro), pero el sitio web puede darle el precio si no utiliz Tourist information centre manager.  - Puede imprimir el cupn correspondiente y llevarlo con su receta a la farmacia.  - Tambin puede pasar por nuestra oficina durante el horario de atencin regular y Education officer, museum una tarjeta de cupones de GoodRx.  - Si necesita que su receta se enve electrnicamente a una farmacia diferente, informe a nuestra  oficina a travs de MyChart de  o por telfono llamando al [phone] y presione la opcin 4.

## 2023-10-07 ENCOUNTER — Ambulatory Visit: Payer: Self-pay | Admitting: Urology

## 2023-10-07 DIAGNOSIS — N529 Male erectile dysfunction, unspecified: Secondary | ICD-10-CM

## 2023-10-10 ENCOUNTER — Ambulatory Visit: Payer: Self-pay | Admitting: Dermatology

## 2023-10-10 DIAGNOSIS — D099 Carcinoma in situ, unspecified: Secondary | ICD-10-CM

## 2023-10-10 LAB — SURGICAL PATHOLOGY

## 2023-10-13 NOTE — Telephone Encounter (Signed)
-----   Message from Brethren sent at 10/10/2023  5:24 PM EDT ----- Diagnosis R mid helix :       SQUAMOUS CELL CARCINOMA IN SITU, BASE INVOLVED    Please call with diagnosis/treatment and schedule FBSE   Explanation: Biopsy shows a squamous cell skin cancer limited to the top layer of skin. This means it is an early cancer and has not spread. However, it has the potential to spread beyond the skin  and threaten your health, so we recommend treating it.   Treatment: Mohs surgery, which involves cutting out right around the skin cancer and then checking under the microscope on the same day to ensure the whole skin cancer is out. If there is more cancer  remaining, the surgeon will repeat the process until it is fully cleared. The cure rate is about 98-99%. It is done at another office outside of [place] ([place], [place], or [place]). Once  the Mohs surgeon confirms the skin cancer is out, they will discuss the options to repair or heal the area. You must take it easy for about two weeks after surgery (no lifting over 10-15 lbs, avoid  activity to get your heart rate and blood pressure up). ----- Message ----- From: Interface, Lab In Three Zero One Sent: 10/10/2023   9:17 AM EDT To: Marc Sharps, MD

## 2023-10-13 NOTE — Telephone Encounter (Signed)
 Called pt. N/A. LMOVM to return call.

## 2023-10-21 ENCOUNTER — Encounter: Payer: Self-pay | Admitting: Dermatology

## 2023-10-21 NOTE — Telephone Encounter (Signed)
 Advised pt and wife of bx results and discussed mohs and provider options.  Patient prefers mohs with Dr. Paci.  Referral sent to Dr. Corey.  Scheduled pt for 53m TBSE 03/24/23 at 9:15 am./sh

## 2023-10-21 NOTE — Telephone Encounter (Signed)
-----   Message from Brethren sent at 10/10/2023  5:24 PM EDT ----- Diagnosis R mid helix :       SQUAMOUS CELL CARCINOMA IN SITU, BASE INVOLVED    Please call with diagnosis/treatment and schedule FBSE   Explanation: Biopsy shows a squamous cell skin cancer limited to the top layer of skin. This means it is an early cancer and has not spread. However, it has the potential to spread beyond the skin  and threaten your health, so we recommend treating it.   Treatment: Mohs surgery, which involves cutting out right around the skin cancer and then checking under the microscope on the same day to ensure the whole skin cancer is out. If there is more cancer  remaining, the surgeon will repeat the process until it is fully cleared. The cure rate is about 98-99%. It is done at another office outside of [place] ([place], [place], or [place]). Once  the Mohs surgeon confirms the skin cancer is out, they will discuss the options to repair or heal the area. You must take it easy for about two weeks after surgery (no lifting over 10-15 lbs, avoid  activity to get your heart rate and blood pressure up). ----- Message ----- From: Interface, Lab In Three Zero One Sent: 10/10/2023   9:17 AM EDT To: Boneta Sharps, MD

## 2023-10-21 NOTE — Addendum Note (Signed)
 Addended by: Dudley Mages R on: 10/21/2023 05:14 PM   Modules accepted: Orders

## 2023-10-22 ENCOUNTER — Ambulatory Visit (INDEPENDENT_AMBULATORY_CARE_PROVIDER_SITE_OTHER): Payer: Medicare Other

## 2023-10-22 DIAGNOSIS — I421 Obstructive hypertrophic cardiomyopathy: Secondary | ICD-10-CM | POA: Diagnosis not present

## 2023-10-22 NOTE — Telephone Encounter (Signed)
 Pt scheduled with Dr. Paci on 11/13/23./sh

## 2023-10-22 NOTE — Progress Notes (Unsigned)
 10/23/2023 8:54 PM   Marc Bradford [birth year] [id number]  Referring provider: Kandis Stefano Iles, MD [address] [place],  [place] [address]  Urological history: 1.  Erectile dysfunction -Contributing factors of age, heart disease, sleep apnea, CVA, diabetes, hyperlipidemia, hypertension and alcohol consumption -Testosterone  level (09/2022) 265  No chief complaint on file.  HPI: Marc Bradford is a 63 y.o. male who presents today for one month follow up.   Previous records reviewed.      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 Base involved. Mohs referral to Dr. Corey   Stroke [facility]) 09/22/2013   a. non-hemorrhagic b. residual left sided weakness    Surgical History: Past Surgical History:  Procedure Laterality Date   COLONOSCOPY WITH PROPOFOL  N/A 07/09/2022   Procedure: COLONOSCOPY WITH PROPOFOL ;  Surgeon: Unk Corinn Skiff, MD;  Location: [facility] ENDOSCOPY;  Service: Gastroenterology;  Laterality: N/A;   IMPLANTABLE CARDIOVERTER DEFIBRILLATOR IMPLANT N/A 04/06/2014   MDT MRI compatible dual chamber ICD implanted by Dr Fernande    Home Medications:  Allergies as of 10/23/2023   No Known Allergies      Medication List        Accurate as of October 22, 2023  8:54 PM. If you have any questions, ask your nurse or doctor.          allopurinol 300 MG tablet Commonly known as: ZYLOPRIM Take 300 mg by mouth daily.   amLODipine 2.5 MG tablet Commonly known as: NORVASC Take 2.5 mg by mouth daily.   aspirin  EC 81 MG tablet Take 81 mg by mouth daily.   atorvastatin 80  MG tablet Commonly known as: LIPITOR Take 80 mg by mouth at bedtime.   clindamycin  1 % lotion Commonly known as: Cleocin -T Apply topically 2 (two) times daily. To scalp until resolved.   lisinopril  40 MG tablet Commonly known as: ZESTRIL  Take 40 mg by mouth daily.   metFORMIN 500 MG tablet Commonly known as: GLUCOPHAGE Take 500 mg by mouth 2 (two) times daily.   tadalafil  10 MG tablet Commonly known as: CIALIS  TAKE 1 TABLET BY MOUTH ONCE DAILY. OK TO TAKE ADDITIONAL 10 MG BOOST DOSE 30 MINUTES PRIOR TO SEXUAL ACTIVITY   VITAMIN D PO Take 1 tablet by mouth daily.        Allergies: No Known Allergies  Family History: Family History  Problem Relation Age of Onset   Heart attack Mother    Sudden death Mother    Hypertension Mother    Sudden death Father    Diabetes Other        family history   Drug abuse Brother    Anemia Neg Hx    Arrhythmia Neg Hx    Asthma Neg Hx    Cancer Neg Hx    Depression Neg Hx    Heart failure Neg Hx    Hyperlipidemia Neg Hx    Kidney disease Neg Hx    Thyroid disease Neg Hx    Fainting Neg Hx    Stroke Neg Hx  Social History:  reports that he has never smoked. He has never been exposed to tobacco smoke. His smokeless tobacco use includes snuff. He reports current alcohol use of about 14.0 standard drinks of alcohol per week. He reports that he does not use drugs.  ROS: Pertinent ROS in HPI  Physical Exam: There were no vitals taken for this visit.  Constitutional:  Well nourished. Alert and oriented, No acute distress. HEENT: [place] AT, moist mucus membranes.  Trachea midline, no masses. Cardiovascular: No clubbing, cyanosis, or edema. Respiratory: Normal respiratory effort, no increased work of breathing. GI: Abdomen is soft, non tender, non distended, no abdominal masses. Liver and spleen not palpable.  No hernias appreciated.  Stool sample for occult testing is not indicated.   GU: No CVA tenderness.  No bladder fullness or  masses.  Patient with circumcised/uncircumcised phallus. ***Foreskin easily retracted***  Urethral meatus is patent.  No penile discharge. No penile lesions or rashes. Scrotum without lesions, cysts, rashes and/or edema.  Testicles are located scrotally bilaterally. No masses are appreciated in the testicles. Left and right epididymis are normal. Rectal: Patient with  normal sphincter tone. Anus and perineum without scarring or rashes. No rectal masses are appreciated. Prostate is approximately *** grams, *** nodules are appreciated. Seminal vesicles are normal. Skin: No rashes, bruises or suspicious lesions. Lymph: No cervical or inguinal adenopathy. Neurologic: Grossly intact, no focal deficits, moving all 4 extremities. Psychiatric: Normal mood and affect.   Laboratory Data: See HPI and EPIC *** I have reviewed the labs.   Pertinent Imaging: N/A  Assessment & Plan:    1. ED -At goal with tadalafil  10 mg daily -Testosterone  is within normal limits for the lab, but it is under 300 -we will not pursue TRT at this time is having good results with his daily Cialis   2. PSA screening ***  No follow-ups on file.  These notes generated with voice recognition software. I apologize for typographical errors.  CLOTILDA HELON RIGGERS  [facility] [address]  [address] [place], [place] [address] [phone]

## 2023-10-23 ENCOUNTER — Telehealth: Payer: Self-pay | Admitting: Urology

## 2023-10-23 ENCOUNTER — Ambulatory Visit: Payer: Self-pay | Admitting: Cardiology

## 2023-10-23 ENCOUNTER — Ambulatory Visit (INDEPENDENT_AMBULATORY_CARE_PROVIDER_SITE_OTHER): Admitting: Urology

## 2023-10-23 ENCOUNTER — Encounter: Payer: Self-pay | Admitting: Urology

## 2023-10-23 VITALS — BP 142/82 | HR 59 | Ht 66.0 in | Wt 250.0 lb

## 2023-10-23 DIAGNOSIS — Z125 Encounter for screening for malignant neoplasm of prostate: Secondary | ICD-10-CM

## 2023-10-23 DIAGNOSIS — N529 Male erectile dysfunction, unspecified: Secondary | ICD-10-CM

## 2023-10-23 DIAGNOSIS — R350 Frequency of micturition: Secondary | ICD-10-CM

## 2023-10-23 LAB — CUP PACEART REMOTE DEVICE CHECK
Battery Remaining Longevity: 10 mo
Battery Voltage: 2.87 V
Brady Statistic AP VP Percent: 0.26 %
Brady Statistic AP VS Percent: 37.24 %
Brady Statistic AS VP Percent: 0.07 %
Brady Statistic AS VS Percent: 62.43 %
Brady Statistic RA Percent Paced: 36.27 %
Brady Statistic RV Percent Paced: 0.4 %
Date Time Interrogation Session: 20250903012505
HighPow Impedance: 63 Ohm
Implantable Lead Connection Status: 753985
Implantable Lead Connection Status: 753985
Implantable Lead Implant Date: 20160217
Implantable Lead Implant Date: 20160217
Implantable Lead Location: 753859
Implantable Lead Location: 753860
Implantable Lead Model: 5076
Implantable Pulse Generator Implant Date: 20160217
Lead Channel Impedance Value: 342 Ohm
Lead Channel Impedance Value: 399 Ohm
Lead Channel Impedance Value: 456 Ohm
Lead Channel Pacing Threshold Amplitude: 0.75 V
Lead Channel Pacing Threshold Amplitude: 0.75 V
Lead Channel Pacing Threshold Pulse Width: 0.4 ms
Lead Channel Pacing Threshold Pulse Width: 0.4 ms
Lead Channel Sensing Intrinsic Amplitude: 1.5 mV
Lead Channel Sensing Intrinsic Amplitude: 1.5 mV
Lead Channel Sensing Intrinsic Amplitude: 31.625 mV
Lead Channel Sensing Intrinsic Amplitude: 31.625 mV
Lead Channel Setting Pacing Amplitude: 2 V
Lead Channel Setting Pacing Amplitude: 2.5 V
Lead Channel Setting Pacing Pulse Width: 0.4 ms
Lead Channel Setting Sensing Sensitivity: 0.3 mV
Zone Setting Status: 755011

## 2023-10-23 LAB — URINALYSIS, COMPLETE
Bilirubin, UA: NEGATIVE
Glucose, UA: NEGATIVE
Ketones, UA: NEGATIVE
Leukocytes,UA: NEGATIVE
Nitrite, UA: NEGATIVE
Protein,UA: NEGATIVE
RBC, UA: NEGATIVE
Specific Gravity, UA: 1.015 (ref 1.005–1.030)
Urobilinogen, Ur: 1 mg/dL (ref 0.2–1.0)
pH, UA: 6.5 (ref 5.0–7.5)

## 2023-10-23 LAB — MICROSCOPIC EXAMINATION: Bacteria, UA: NONE SEEN

## 2023-10-23 LAB — BLADDER SCAN AMB NON-IMAGING

## 2023-10-23 MED ORDER — TADALAFIL 10 MG PO TABS
10.0000 mg | ORAL_TABLET | Freq: Every day | ORAL | 3 refills | Status: DC | PRN
Start: 2023-10-23 — End: 2023-12-18

## 2023-10-23 NOTE — Telephone Encounter (Signed)
 Would you let Marc Bradford know that I would you call Marc Bradford and get his last few PSAs?

## 2023-11-01 NOTE — Progress Notes (Signed)
Remote ICD Transmission.

## 2023-11-10 ENCOUNTER — Encounter: Payer: Self-pay | Admitting: Dermatology

## 2023-11-13 ENCOUNTER — Encounter: Payer: Self-pay | Admitting: Dermatology

## 2023-11-13 ENCOUNTER — Ambulatory Visit (INDEPENDENT_AMBULATORY_CARE_PROVIDER_SITE_OTHER): Admitting: Dermatology

## 2023-11-13 VITALS — BP 138/83 | HR 60 | Temp 97.9°F

## 2023-11-13 DIAGNOSIS — C4492 Squamous cell carcinoma of skin, unspecified: Secondary | ICD-10-CM

## 2023-11-13 DIAGNOSIS — L578 Other skin changes due to chronic exposure to nonionizing radiation: Secondary | ICD-10-CM

## 2023-11-13 DIAGNOSIS — D0421 Carcinoma in situ of skin of right ear and external auricular canal: Secondary | ICD-10-CM | POA: Diagnosis not present

## 2023-11-13 DIAGNOSIS — L814 Other melanin hyperpigmentation: Secondary | ICD-10-CM | POA: Diagnosis not present

## 2023-11-13 MED ORDER — GENTAMICIN SULFATE 0.1 % EX OINT: 1.0000 | TOPICAL_OINTMENT | Freq: Three times a day (TID) | CUTANEOUS | 0 refills | Status: DC

## 2023-11-13 MED ORDER — GENTAMICIN SULFATE 0.1 % EX OINT: 1.0000 | TOPICAL_OINTMENT | Freq: Three times a day (TID) | CUTANEOUS | 0 refills | Status: AC

## 2023-11-13 MED ORDER — MUPIROCIN 2 % EX OINT
1.0000 | TOPICAL_OINTMENT | Freq: Two times a day (BID) | CUTANEOUS | 0 refills | Status: DC
Start: 2023-11-13 — End: 2023-11-13

## 2023-11-13 MED ORDER — MUPIROCIN 2 % EX OINT: 1.0000 | TOPICAL_OINTMENT | Freq: Two times a day (BID) | CUTANEOUS | 0 refills | Status: AC

## 2023-11-13 NOTE — Patient Instructions (Signed)

## 2023-11-13 NOTE — Progress Notes (Signed)
 Follow-Up Visit   Subjective  Marc Bradford is a 63 y.o. male who presents for the following: Mohs of an Squamous Carcinoma in Situ of the right mid helix, referred by Dr. Claudene.  The following portions of the chart were reviewed this encounter and updated as appropriate: medications, allergies, medical history  Review of Systems:  No other skin or systemic complaints except as noted in HPI or Assessment and Plan.  Objective  Well appearing patient in no apparent distress; mood and affect are within normal limits.  A focused examination was performed of the following areas: Right mid helix Relevant physical exam findings are noted in the Assessment and Plan.   right helix Hyperkeratotic crusted papule   Assessment & Plan   SQUAMOUS CELL CARCINOMA OF SKIN right helix Mohs surgery  Consent obtained: written  Anticoagulation: Was the anticoagulation regimen changed prior to Mohs? No    Anesthesia: Anesthesia method: local infiltration Local anesthetic: lidocaine  1% WITH epi  Procedure Details: Timeout: pre-procedure verification complete Procedure Prep: patient was prepped and draped in usual sterile fashion Prep type: chlorhexidine  Biopsy accession number: [id number] Pre-Op diagnosis: squamous cell carcinoma SCC subtype: in situ MohsAIQ Surgical site (if tumor spans multiple areas, please select predominant area): ear Surgery side: right Surgical site (from skin exam): right helix Pre-operative length (cm): 1.1 Pre-operative width (cm): 0.5 Indications for Mohs surgery: anatomic location where tissue conservation is critical  Micrographic Surgery Details: Post-operative length (cm): 2 Post-operative width (cm): 0.9 Number of Mohs stages: 1 Cumulative additional sections past 5 per stage: 0 Post surgery depth of defect: perichondrium  Stage 1    Tumor features identified on Mohs section: no tumor identified  Reconstruction: Was the defect reconstructed?:  No      Return in about 4 weeks (around 12/11/2023) for wound check.  LILLETTE Darice Smock, CMA, am acting as scribe for RUFUS CHRISTELLA HOLY, MD.    11/13/2023  HISTORY OF PRESENT ILLNESS  Marc Bradford is seen in consultation at the request of Dr. Claudene for biopsy-proven Squamous Carcinoma in Situ of the right mid helix. They note that the area has been present for about 6 months increasing in size with time.  There is no history of previous treatment.  Reports no other new or changing lesions and has no other complaints today.  Medications and allergies: see patient chart.  Review of systems: Reviewed 8 systems and notable for the above skin cancer.  All other systems reviewed are unremarkable/negative, unless noted in the HPI. Past medical history, surgical history, family history, social history were also reviewed and are noted in the chart/questionnaire.    PHYSICAL EXAMINATION  General: Well-appearing, in no acute distress, alert and oriented x 4. Vitals reviewed in chart (if available).   Skin: Exam reveals a 1.1 x 0.5 cm erythematous papule and biopsy scar on the right mid helix. There are rhytids, telangiectasias, and lentigines, consistent with photodamage.  Biopsy report(s) reviewed, confirming the diagnosis.   ASSESSMENT  1) Squamous Carcinoma in Situ on the right mid helix 2) photodamage 3) solar lentigines   PLAN   1. Due to location, size, histology, or recurrence and the likelihood of subclinical extension as well as the need to conserve normal surrounding tissue, the patient was deemed acceptable for Mohs micrographic surgery (MMS).  The nature and purpose of the procedure, associated benefits and risks including recurrence and scarring, possible complications such as pain, infection, and bleeding, and alternative methods of treatment if appropriate were discussed with  the patient during consent. The lesion location was verified by the patient, by reviewing previous notes,  pathology reports, and by photographs as well as angulation measurements if available.  Informed consent was reviewed and signed by the patient, and timeout was performed at 9:00 AM. See op note below.  2. For the photodamage and solar lentigines, sun protection discussed/information given on OTC sunscreens, and we recommend continued regular follow-up with primary dermatologist every 6 months or sooner for any growing, bleeding, or changing lesions. 3. Prognosis and future surveillance discussed. 4. Letter with treatment outcome sent to referring provider. 5. Pain acetaminophen /ibuprofen  MOHS MICROGRAPHIC SURGERY AND RECONSTRUCTION  Initial size:   1.1 x 0.5 cm Surgical defect/wound size: 2.0 x 0.9 cm Anesthesia:    0.33% lidocaine  with 1:200,000 epinephrine EBL:    <5 mL Complications:  None Repair type:   Second Intention  Stages: 1  STAGE I: Anesthesia achieved with 0.5% lidocaine  with 1:200,000 epinephrine. ChloraPrep applied. 1 section(s) excised using Mohs technique (this includes total peripheral and deep tissue margin excision and evaluation with frozen sections, excised and interpreted by the same physician). The tumor was first debulked and then excised with an approx. 2mm margin.  Hemostasis was achieved with electrocautery as needed.  The specimen was then oriented, subdivided/relaxed, inked, and processed using Mohs technique.    Frozen section analysis revealed a clear deep and peripheral margin.   Reconstruction  Patient was notified of results and repair options were discussed, including second intention healing. After reviewing the advantages and disadvantages of each, we agreed on second intention healing as appropriate.   The surgical site was then lightly scrubbed with sterile, saline-soaked gauze.  The area was bandaged using Vaseline ointment, non-adherent gauze, gauze pads, and tape to provide an adequate pressure dressing.   The patient tolerated the procedure  well, was given detailed written and verbal wound care instructions, and was discharged in good condition.  The patient will follow-up in 4 weeks and as scheduled with primary dermatologist.  Documentation: I have reviewed the above documentation for accuracy and completeness, and I agree with the above.  RUFUS CHRISTELLA HOLY, MD

## 2023-12-11 ENCOUNTER — Encounter: Payer: Self-pay | Admitting: Dermatology

## 2023-12-11 ENCOUNTER — Ambulatory Visit (INDEPENDENT_AMBULATORY_CARE_PROVIDER_SITE_OTHER): Admitting: Dermatology

## 2023-12-11 ENCOUNTER — Other Ambulatory Visit: Payer: Self-pay | Admitting: Urology

## 2023-12-11 VITALS — BP 152/76 | HR 60

## 2023-12-11 DIAGNOSIS — N529 Male erectile dysfunction, unspecified: Secondary | ICD-10-CM

## 2023-12-11 DIAGNOSIS — C4492 Squamous cell carcinoma of skin, unspecified: Secondary | ICD-10-CM

## 2023-12-11 DIAGNOSIS — Z85828 Personal history of other malignant neoplasm of skin: Secondary | ICD-10-CM | POA: Diagnosis not present

## 2023-12-11 DIAGNOSIS — L905 Scar conditions and fibrosis of skin: Secondary | ICD-10-CM

## 2023-12-11 NOTE — Progress Notes (Unsigned)
   Follow Up Visit   Subjective  Marc Bradford is a 63 y.o. male who presents for the following: follow up from Mohs surgery   The patient presents for follow up from Mohs surgery for a SCC on the right helix, treated on 11/13/23,healing by second intention. The patient has been bandaging the wound as directed. The endorse the following concerns: No questions or concerns at this time.  The following portions of the chart were reviewed this encounter and updated as appropriate: medications, allergies, medical history  Review of Systems:  No other skin or systemic complaints except as noted in HPI or Assessment and Plan.  Objective  Well appearing patient in no apparent distress; mood and affect are within normal limits.  A focal examination was performed including scalp, head, face and right ear. All findings within normal limits unless otherwise noted below.  Healing wound with mild erythema  Relevant physical exam findings are noted in the Assessment and Plan.    Assessment & Plan   Scar s/p Mohs for [facility], treated on 11/13/23, repaired with second intention - Reassured that wound is healing well - No evidence of infection - No swelling, induration, purulence, dehiscence, or tenderness out of proportion to the clinical exam, see photo above - Discussed that scars take up to 12 months to mature from the date of surgery - Recommend SPF 30+ to scar daily to prevent purple color from UV exposure during scar maturation process - Discussed that erythema and raised appearance of scar will fade over the next 4-6 months - OK to start scar massage at 4-6 weeks post-op - Can consider silicone based products for scar healing starting at 6 weeks post-op - Ok to discontinue ointment daily to wound   HISTORY OF SQUAMOUS CELL CARCINOMA OF THE SKIN - No evidence of recurrence today - No lymphadenopathy - Recommend regular full body skin exams - Recommend daily broad spectrum sunscreen SPF 30+ to  sun-exposed areas, reapply every 2 hours as needed.  - Call if any new or changing lesions are noted between office visits      Return if symptoms worsen or fail to improve.  I, Berwyn Lesches, Surg Tech III, am acting as scribe for RUFUS CHRISTELLA HOLY, MD.   Documentation: I have reviewed the above documentation for accuracy and completeness, and I agree with the above.  RUFUS CHRISTELLA HOLY, MD

## 2023-12-11 NOTE — Patient Instructions (Signed)

## 2023-12-15 ENCOUNTER — Other Ambulatory Visit: Payer: Self-pay | Admitting: Urology

## 2023-12-15 DIAGNOSIS — N529 Male erectile dysfunction, unspecified: Secondary | ICD-10-CM

## 2023-12-17 ENCOUNTER — Other Ambulatory Visit: Payer: Self-pay | Admitting: Urology

## 2023-12-17 DIAGNOSIS — N529 Male erectile dysfunction, unspecified: Secondary | ICD-10-CM

## 2024-01-21 ENCOUNTER — Ambulatory Visit: Payer: Medicare Other

## 2024-01-21 DIAGNOSIS — I421 Obstructive hypertrophic cardiomyopathy: Secondary | ICD-10-CM

## 2024-01-22 ENCOUNTER — Ambulatory Visit: Payer: Self-pay | Admitting: Cardiology

## 2024-01-22 LAB — CUP PACEART REMOTE DEVICE CHECK
Battery Remaining Longevity: 7 mo
Battery Voltage: 2.86 V
Brady Statistic AP VP Percent: 0.19 %
Brady Statistic AP VS Percent: 31.9 %
Brady Statistic AS VP Percent: 0.07 %
Brady Statistic AS VS Percent: 67.84 %
Brady Statistic RA Percent Paced: 31.06 %
Brady Statistic RV Percent Paced: 0.31 %
Date Time Interrogation Session: 20251203043823
HighPow Impedance: 64 Ohm
Implantable Lead Connection Status: 753985
Implantable Lead Connection Status: 753985
Implantable Lead Implant Date: 20160217
Implantable Lead Implant Date: 20160217
Implantable Lead Location: 753859
Implantable Lead Location: 753860
Implantable Lead Model: 5076
Implantable Pulse Generator Implant Date: 20160217
Lead Channel Impedance Value: 342 Ohm
Lead Channel Impedance Value: 399 Ohm
Lead Channel Impedance Value: 456 Ohm
Lead Channel Pacing Threshold Amplitude: 0.75 V
Lead Channel Pacing Threshold Amplitude: 0.875 V
Lead Channel Pacing Threshold Pulse Width: 0.4 ms
Lead Channel Pacing Threshold Pulse Width: 0.4 ms
Lead Channel Sensing Intrinsic Amplitude: 0.875 mV
Lead Channel Sensing Intrinsic Amplitude: 0.875 mV
Lead Channel Sensing Intrinsic Amplitude: 31.625 mV
Lead Channel Sensing Intrinsic Amplitude: 31.625 mV
Lead Channel Setting Pacing Amplitude: 2 V
Lead Channel Setting Pacing Amplitude: 2.5 V
Lead Channel Setting Pacing Pulse Width: 0.4 ms
Lead Channel Setting Sensing Sensitivity: 0.3 mV
Zone Setting Status: 755011

## 2024-01-27 NOTE — Progress Notes (Signed)
 Remote ICD Transmission

## 2024-03-22 ENCOUNTER — Ambulatory Visit: Admitting: Dermatology

## 2024-04-01 ENCOUNTER — Ambulatory Visit: Admitting: Dermatology

## 2024-04-21 ENCOUNTER — Ambulatory Visit

## 2024-07-21 ENCOUNTER — Ambulatory Visit

## 2024-10-20 ENCOUNTER — Ambulatory Visit

## 2024-10-22 ENCOUNTER — Ambulatory Visit: Admitting: Urology

## 2025-01-19 ENCOUNTER — Ambulatory Visit

## 2025-04-20 ENCOUNTER — Ambulatory Visit
